# Patient Record
Sex: Female | Born: 1945
Health system: Southern US, Community
[De-identification: ages and names within clinical notes are randomized; demographics above are authoritative.]

## PROBLEM LIST (undated history)

## (undated) DIAGNOSIS — R7303 Prediabetes: Secondary | ICD-10-CM

## (undated) DIAGNOSIS — E1140 Type 2 diabetes mellitus with diabetic neuropathy, unspecified: Secondary | ICD-10-CM

## (undated) DIAGNOSIS — Z8709 Personal history of other diseases of the respiratory system: Secondary | ICD-10-CM

## (undated) DIAGNOSIS — H3530 Unspecified macular degeneration: Secondary | ICD-10-CM

## (undated) DIAGNOSIS — E119 Type 2 diabetes mellitus without complications: Secondary | ICD-10-CM

## (undated) DIAGNOSIS — R7309 Other abnormal glucose: Secondary | ICD-10-CM

## (undated) DIAGNOSIS — R2689 Other abnormalities of gait and mobility: Secondary | ICD-10-CM

## (undated) DIAGNOSIS — G43909 Migraine, unspecified, not intractable, without status migrainosus: Secondary | ICD-10-CM

## (undated) DIAGNOSIS — C439 Malignant melanoma of skin, unspecified: Secondary | ICD-10-CM

## (undated) DIAGNOSIS — G629 Polyneuropathy, unspecified: Secondary | ICD-10-CM

## (undated) DIAGNOSIS — Z87442 Personal history of urinary calculi: Secondary | ICD-10-CM

## (undated) DIAGNOSIS — M3500 Sicca syndrome, unspecified: Secondary | ICD-10-CM

## (undated) DIAGNOSIS — K589 Irritable bowel syndrome without diarrhea: Secondary | ICD-10-CM

## (undated) DIAGNOSIS — K760 Fatty (change of) liver, not elsewhere classified: Secondary | ICD-10-CM

## (undated) DIAGNOSIS — F419 Anxiety disorder, unspecified: Secondary | ICD-10-CM

## (undated) DIAGNOSIS — Z9889 Other specified postprocedural states: Secondary | ICD-10-CM

## (undated) DIAGNOSIS — R51 Headache: Secondary | ICD-10-CM

## (undated) DIAGNOSIS — M8580 Other specified disorders of bone density and structure, unspecified site: Secondary | ICD-10-CM

## (undated) DIAGNOSIS — M797 Fibromyalgia: Secondary | ICD-10-CM

## (undated) DIAGNOSIS — R112 Nausea with vomiting, unspecified: Secondary | ICD-10-CM

## (undated) DIAGNOSIS — Z8719 Personal history of other diseases of the digestive system: Secondary | ICD-10-CM

## (undated) DIAGNOSIS — Z8489 Family history of other specified conditions: Secondary | ICD-10-CM

## (undated) DIAGNOSIS — K219 Gastro-esophageal reflux disease without esophagitis: Secondary | ICD-10-CM

## (undated) DIAGNOSIS — H269 Unspecified cataract: Secondary | ICD-10-CM

## (undated) DIAGNOSIS — M1990 Unspecified osteoarthritis, unspecified site: Secondary | ICD-10-CM

## (undated) DIAGNOSIS — N200 Calculus of kidney: Secondary | ICD-10-CM

## (undated) DIAGNOSIS — F411 Generalized anxiety disorder: Secondary | ICD-10-CM

## (undated) DIAGNOSIS — I10 Essential (primary) hypertension: Secondary | ICD-10-CM

## (undated) HISTORY — DX: Other abnormal glucose: R73.09

## (undated) HISTORY — PX: SHOULDER SURGERY: SHX246

## (undated) HISTORY — DX: Prediabetes: R73.03

## (undated) HISTORY — DX: Unspecified macular degeneration: H35.30

## (undated) HISTORY — DX: Other specified disorders of bone density and structure, unspecified site: M85.80

## (undated) HISTORY — PX: UPPER GI ENDOSCOPY: SHX6162

## (undated) HISTORY — DX: Generalized anxiety disorder: F41.1

## (undated) HISTORY — PX: LITHOTRIPSY: SUR834

## (undated) HISTORY — DX: Type 2 diabetes mellitus with diabetic neuropathy, unspecified: E11.40

## (undated) HISTORY — PX: COLONOSCOPY: SHX174

## (undated) HISTORY — PX: TONSILLECTOMY: SUR1361

---
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
## 1974-12-29 HISTORY — PX: TUBAL LIGATION: SHX77

## 1976-12-29 HISTORY — PX: ABDOMINAL HYSTERECTOMY: SHX81

## 1999-11-05 ENCOUNTER — Encounter: Payer: Self-pay | Admitting: Obstetrics and Gynecology

## 1999-11-05 ENCOUNTER — Encounter: Admission: RE | Admit: 1999-11-05 | Discharge: 1999-11-05 | Payer: Self-pay | Admitting: Obstetrics and Gynecology

## 2000-11-23 ENCOUNTER — Encounter: Admission: RE | Admit: 2000-11-23 | Discharge: 2000-11-23 | Payer: Self-pay | Admitting: Obstetrics and Gynecology

## 2000-11-23 ENCOUNTER — Encounter: Payer: Self-pay | Admitting: Obstetrics and Gynecology

## 2000-12-03 ENCOUNTER — Other Ambulatory Visit: Admission: RE | Admit: 2000-12-03 | Discharge: 2000-12-03 | Payer: Self-pay | Admitting: Obstetrics and Gynecology

## 2000-12-23 ENCOUNTER — Encounter: Admission: RE | Admit: 2000-12-23 | Discharge: 2000-12-23 | Payer: Self-pay | Admitting: Obstetrics and Gynecology

## 2000-12-23 ENCOUNTER — Encounter: Payer: Self-pay | Admitting: Obstetrics and Gynecology

## 2001-11-24 ENCOUNTER — Encounter: Admission: RE | Admit: 2001-11-24 | Discharge: 2001-11-24 | Payer: Self-pay | Admitting: Obstetrics and Gynecology

## 2001-11-24 ENCOUNTER — Encounter: Payer: Self-pay | Admitting: Obstetrics and Gynecology

## 2001-12-08 ENCOUNTER — Other Ambulatory Visit: Admission: RE | Admit: 2001-12-08 | Discharge: 2001-12-08 | Payer: Self-pay | Admitting: Obstetrics and Gynecology

## 2002-01-01 ENCOUNTER — Encounter: Payer: Self-pay | Admitting: Family Medicine

## 2002-01-01 ENCOUNTER — Ambulatory Visit (HOSPITAL_COMMUNITY): Admission: RE | Admit: 2002-01-01 | Discharge: 2002-01-01 | Payer: Self-pay | Admitting: Family Medicine

## 2002-06-17 ENCOUNTER — Encounter: Payer: Self-pay | Admitting: Family Medicine

## 2002-06-17 ENCOUNTER — Ambulatory Visit (HOSPITAL_COMMUNITY): Admission: RE | Admit: 2002-06-17 | Discharge: 2002-06-17 | Payer: Self-pay | Admitting: Family Medicine

## 2002-06-23 ENCOUNTER — Ambulatory Visit (HOSPITAL_COMMUNITY): Admission: RE | Admit: 2002-06-23 | Discharge: 2002-06-23 | Payer: Self-pay | Admitting: Family Medicine

## 2002-06-23 ENCOUNTER — Encounter: Payer: Self-pay | Admitting: Family Medicine

## 2002-07-07 ENCOUNTER — Encounter: Payer: Self-pay | Admitting: General Surgery

## 2002-07-07 ENCOUNTER — Encounter: Admission: RE | Admit: 2002-07-07 | Discharge: 2002-07-07 | Payer: Self-pay | Admitting: General Surgery

## 2002-08-03 ENCOUNTER — Observation Stay (HOSPITAL_COMMUNITY): Admission: RE | Admit: 2002-08-03 | Discharge: 2002-08-04 | Payer: Self-pay | Admitting: General Surgery

## 2002-08-03 ENCOUNTER — Encounter: Payer: Self-pay | Admitting: General Surgery

## 2002-08-03 ENCOUNTER — Encounter (INDEPENDENT_AMBULATORY_CARE_PROVIDER_SITE_OTHER): Payer: Self-pay | Admitting: Specialist

## 2002-11-25 ENCOUNTER — Encounter: Payer: Self-pay | Admitting: Obstetrics and Gynecology

## 2002-11-25 ENCOUNTER — Encounter: Admission: RE | Admit: 2002-11-25 | Discharge: 2002-11-25 | Payer: Self-pay | Admitting: Obstetrics and Gynecology

## 2002-12-26 ENCOUNTER — Encounter: Admission: RE | Admit: 2002-12-26 | Discharge: 2002-12-26 | Payer: Self-pay | Admitting: Obstetrics and Gynecology

## 2002-12-26 ENCOUNTER — Encounter: Payer: Self-pay | Admitting: Obstetrics and Gynecology

## 2003-04-27 ENCOUNTER — Encounter: Payer: Self-pay | Admitting: Family Medicine

## 2003-04-27 ENCOUNTER — Ambulatory Visit (HOSPITAL_COMMUNITY): Admission: RE | Admit: 2003-04-27 | Discharge: 2003-04-27 | Payer: Self-pay | Admitting: Family Medicine

## 2003-05-08 ENCOUNTER — Encounter (INDEPENDENT_AMBULATORY_CARE_PROVIDER_SITE_OTHER): Payer: Self-pay | Admitting: Specialist

## 2003-05-08 ENCOUNTER — Ambulatory Visit (HOSPITAL_COMMUNITY): Admission: RE | Admit: 2003-05-08 | Discharge: 2003-05-08 | Payer: Self-pay | Admitting: Gastroenterology

## 2003-07-20 ENCOUNTER — Ambulatory Visit (HOSPITAL_COMMUNITY): Admission: RE | Admit: 2003-07-20 | Discharge: 2003-07-20 | Payer: Self-pay | Admitting: Family Medicine

## 2003-07-20 ENCOUNTER — Encounter: Payer: Self-pay | Admitting: Family Medicine

## 2003-08-01 ENCOUNTER — Ambulatory Visit (HOSPITAL_COMMUNITY): Admission: RE | Admit: 2003-08-01 | Discharge: 2003-08-01 | Payer: Self-pay | Admitting: Family Medicine

## 2003-08-28 ENCOUNTER — Ambulatory Visit (HOSPITAL_COMMUNITY): Admission: RE | Admit: 2003-08-28 | Discharge: 2003-08-28 | Payer: Self-pay | Admitting: Critical Care Medicine

## 2003-10-19 ENCOUNTER — Encounter: Payer: Self-pay | Admitting: Family Medicine

## 2003-10-19 ENCOUNTER — Ambulatory Visit (HOSPITAL_COMMUNITY): Admission: RE | Admit: 2003-10-19 | Discharge: 2003-10-19 | Payer: Self-pay | Admitting: Family Medicine

## 2003-12-07 ENCOUNTER — Encounter: Admission: RE | Admit: 2003-12-07 | Discharge: 2003-12-07 | Payer: Self-pay | Admitting: Obstetrics and Gynecology

## 2003-12-30 HISTORY — PX: CHOLECYSTECTOMY: SHX55

## 2004-04-19 ENCOUNTER — Ambulatory Visit (HOSPITAL_COMMUNITY): Admission: RE | Admit: 2004-04-19 | Discharge: 2004-04-19 | Payer: Self-pay | Admitting: Family Medicine

## 2004-05-23 ENCOUNTER — Ambulatory Visit (HOSPITAL_COMMUNITY): Admission: RE | Admit: 2004-05-23 | Discharge: 2004-05-23 | Payer: Self-pay | Admitting: Family Medicine

## 2004-08-20 ENCOUNTER — Ambulatory Visit (HOSPITAL_COMMUNITY): Admission: RE | Admit: 2004-08-20 | Discharge: 2004-08-20 | Payer: Self-pay | Admitting: Gastroenterology

## 2005-01-09 ENCOUNTER — Encounter: Admission: RE | Admit: 2005-01-09 | Discharge: 2005-01-09 | Payer: Self-pay | Admitting: Obstetrics and Gynecology

## 2005-07-30 ENCOUNTER — Encounter: Admission: RE | Admit: 2005-07-30 | Discharge: 2005-07-30 | Payer: Self-pay | Admitting: Obstetrics and Gynecology

## 2006-01-13 ENCOUNTER — Encounter: Admission: RE | Admit: 2006-01-13 | Discharge: 2006-01-13 | Payer: Self-pay | Admitting: Obstetrics and Gynecology

## 2007-02-01 ENCOUNTER — Encounter: Admission: RE | Admit: 2007-02-01 | Discharge: 2007-02-01 | Payer: Self-pay | Admitting: Family Medicine

## 2007-08-04 ENCOUNTER — Ambulatory Visit (HOSPITAL_COMMUNITY): Admission: RE | Admit: 2007-08-04 | Discharge: 2007-08-04 | Payer: Self-pay | Admitting: Family Medicine

## 2007-09-07 ENCOUNTER — Encounter (HOSPITAL_COMMUNITY): Admission: RE | Admit: 2007-09-07 | Discharge: 2007-09-28 | Payer: Self-pay | Admitting: Orthopedic Surgery

## 2007-09-30 ENCOUNTER — Encounter (HOSPITAL_COMMUNITY): Admission: RE | Admit: 2007-09-30 | Discharge: 2007-10-30 | Payer: Self-pay | Admitting: Orthopedic Surgery

## 2007-11-03 ENCOUNTER — Encounter: Admission: RE | Admit: 2007-11-03 | Discharge: 2007-11-03 | Payer: Self-pay | Admitting: Orthopedic Surgery

## 2008-02-23 ENCOUNTER — Encounter: Admission: RE | Admit: 2008-02-23 | Discharge: 2008-02-23 | Payer: Self-pay | Admitting: Obstetrics and Gynecology

## 2008-02-29 ENCOUNTER — Encounter: Admission: RE | Admit: 2008-02-29 | Discharge: 2008-02-29 | Payer: Self-pay | Admitting: Family Medicine

## 2008-03-06 ENCOUNTER — Encounter: Admission: RE | Admit: 2008-03-06 | Discharge: 2008-03-06 | Payer: Self-pay | Admitting: Obstetrics and Gynecology

## 2008-03-06 HISTORY — PX: BREAST BIOPSY: SHX20

## 2008-03-26 ENCOUNTER — Encounter: Admission: RE | Admit: 2008-03-26 | Discharge: 2008-03-26 | Payer: Self-pay | Admitting: Unknown Physician Specialty

## 2008-09-12 ENCOUNTER — Encounter: Admission: RE | Admit: 2008-09-12 | Discharge: 2008-09-12 | Payer: Self-pay | Admitting: Obstetrics and Gynecology

## 2008-10-15 ENCOUNTER — Encounter: Admission: RE | Admit: 2008-10-15 | Discharge: 2008-10-15 | Payer: Self-pay | Admitting: Unknown Physician Specialty

## 2008-11-15 ENCOUNTER — Ambulatory Visit (HOSPITAL_COMMUNITY): Admission: RE | Admit: 2008-11-15 | Discharge: 2008-11-15 | Payer: Self-pay | Admitting: Family Medicine

## 2008-12-29 HISTORY — PX: BILATERAL SALPINGOOPHORECTOMY: SHX1223

## 2009-01-01 DIAGNOSIS — J383 Other diseases of vocal cords: Secondary | ICD-10-CM | POA: Insufficient documentation

## 2009-01-01 DIAGNOSIS — R05 Cough: Secondary | ICD-10-CM

## 2009-01-01 DIAGNOSIS — R059 Cough, unspecified: Secondary | ICD-10-CM | POA: Insufficient documentation

## 2009-01-01 DIAGNOSIS — K219 Gastro-esophageal reflux disease without esophagitis: Secondary | ICD-10-CM | POA: Insufficient documentation

## 2009-01-02 ENCOUNTER — Ambulatory Visit: Payer: Self-pay | Admitting: Critical Care Medicine

## 2009-03-06 ENCOUNTER — Ambulatory Visit: Payer: Self-pay | Admitting: Critical Care Medicine

## 2009-03-12 ENCOUNTER — Encounter: Payer: Self-pay | Admitting: Critical Care Medicine

## 2009-03-12 ENCOUNTER — Encounter: Admission: RE | Admit: 2009-03-12 | Discharge: 2009-03-12 | Payer: Self-pay | Admitting: Obstetrics and Gynecology

## 2009-11-27 ENCOUNTER — Ambulatory Visit (HOSPITAL_COMMUNITY): Admission: RE | Admit: 2009-11-27 | Discharge: 2009-11-27 | Payer: Self-pay | Admitting: Obstetrics and Gynecology

## 2010-03-18 ENCOUNTER — Encounter: Admission: RE | Admit: 2010-03-18 | Discharge: 2010-03-18 | Payer: Self-pay | Admitting: Obstetrics and Gynecology

## 2010-10-04 ENCOUNTER — Encounter: Admission: RE | Admit: 2010-10-04 | Discharge: 2010-10-04 | Payer: Self-pay | Admitting: Orthopedic Surgery

## 2010-10-14 ENCOUNTER — Ambulatory Visit (HOSPITAL_COMMUNITY): Admission: RE | Admit: 2010-10-14 | Discharge: 2010-10-14 | Payer: Self-pay | Admitting: Family Medicine

## 2010-10-15 ENCOUNTER — Ambulatory Visit (HOSPITAL_COMMUNITY): Admission: RE | Admit: 2010-10-15 | Discharge: 2010-10-15 | Payer: Self-pay | Admitting: Family Medicine

## 2010-10-15 ENCOUNTER — Encounter (INDEPENDENT_AMBULATORY_CARE_PROVIDER_SITE_OTHER): Payer: Self-pay | Admitting: Family Medicine

## 2011-01-18 ENCOUNTER — Encounter: Payer: Self-pay | Admitting: Family Medicine

## 2011-01-19 ENCOUNTER — Encounter: Payer: Self-pay | Admitting: Family Medicine

## 2011-02-18 ENCOUNTER — Other Ambulatory Visit: Payer: Self-pay | Admitting: Family Medicine

## 2011-02-18 DIAGNOSIS — Z1231 Encounter for screening mammogram for malignant neoplasm of breast: Secondary | ICD-10-CM

## 2011-03-24 ENCOUNTER — Ambulatory Visit
Admission: RE | Admit: 2011-03-24 | Discharge: 2011-03-24 | Disposition: A | Discharge status: Home / Self Care | Payer: BC Managed Care – PPO | Source: Ambulatory Visit | Attending: Family Medicine | Admitting: Family Medicine

## 2011-03-24 DIAGNOSIS — Z1231 Encounter for screening mammogram for malignant neoplasm of breast: Secondary | ICD-10-CM

## 2011-03-25 ENCOUNTER — Other Ambulatory Visit: Payer: Self-pay | Admitting: Family Medicine

## 2011-03-25 DIAGNOSIS — R928 Other abnormal and inconclusive findings on diagnostic imaging of breast: Secondary | ICD-10-CM

## 2011-04-01 ENCOUNTER — Ambulatory Visit
Admission: RE | Admit: 2011-04-01 | Discharge: 2011-04-01 | Disposition: A | Discharge status: Home / Self Care | Payer: BC Managed Care – PPO | Source: Ambulatory Visit | Attending: Family Medicine | Admitting: Family Medicine

## 2011-04-01 DIAGNOSIS — R928 Other abnormal and inconclusive findings on diagnostic imaging of breast: Secondary | ICD-10-CM

## 2011-04-02 LAB — CBC
MCV: 90.7 fL (ref 78.0–100.0)
Platelets: 294 10*3/uL (ref 150–400)
RBC: 4.55 MIL/uL (ref 3.87–5.11)

## 2011-04-02 LAB — TYPE AND SCREEN: Antibody Screen: NEGATIVE

## 2011-04-02 LAB — BASIC METABOLIC PANEL
CO2: 29 mEq/L (ref 19–32)
Calcium: 9.8 mg/dL (ref 8.4–10.5)
GFR calc Af Amer: >60 mL/min (ref >60)
GFR calc non Af Amer: >60 mL/min (ref >60)
Glucose, Bld: 124 mg/dL — ABNORMAL HIGH (ref 70–99)
Potassium: 3.6 mEq/L (ref 3.5–5.1)

## 2011-05-16 NOTE — Op Note (Signed)
NAME:  Victoria Andrade, Victoria Andrade                           ACCOUNT NO.:  [id number]   MEDICAL RECORD NO.:  [id number]                   PATIENT TYPE:  AMB   LOCATION:  ENDO                                 FACILITY:  [facility]   PHYSICIAN:  James L. Malon Kindle., M.D.          DATE OF BIRTH:  [birth year]   DATE OF PROCEDURE:  08/20/2004  DATE OF DISCHARGE:                                 OPERATIVE REPORT   PROCEDURE:  Esophagogastroduodenoscopy.   MEDICATIONS GIVEN:  Fentanyl 75 mcg, Versed 7 mg IV, Cetacaine spray.   The patient had a latex allergy and latex free equipment was used.   INDICATIONS FOR PROCEDURE:  Persistent bloating, epigastric and chest pain,  possibly due to reflux.   DESCRIPTION OF PROCEDURE:  The procedure was explained to the patient and  consent obtained.  With the patient in the left lateral decubitus position,  the Olympus scope was inserted and advanced.  The stomach was entered,  pylorus identified and passed.  The duodenum including the bulb and second  duodenum were seen well and were unremarkable.  The scope was withdrawn back  into the stomach.  The pyloric channel, antrum, and body were normal.  The  fundus and cardia were seen well on the retroflex view and were normal.  There was a hiatal hernia with a widely patent GE junction and a reddened  distal esophagus consistent with esophageal reflux.  There were no  ulcerations and no Barrett's esophagus.  The proximal esophagus was seen  well and was normal.  The scope was withdrawn.  The patient tolerated the  procedure well and was maintained on low flow oxygen and pulse oximetry  throughout the procedure.   ASSESSMENT:  Gastroesophageal reflux disease, 530.81.   PLAN:  Will give a trial of Pepcid and a sheet or anti-reflux instructions  and see back in the office in two months.                                               James L. Malon Kindle., M.D.    Waldron Session  D:  08/20/2004  T:  08/20/2004  Job:  [id number]   cc:   Corrie Mckusick, M.D.  [address] [place]. [place]  [place]  [place] [address]  Fax: [phone]

## 2011-05-16 NOTE — Procedures (Signed)
   NAME:  Victoria Andrade, Victoria Andrade                           ACCOUNT NO.:  [id number]   MEDICAL RECORD NO.:  [id number]                   PATIENT TYPE:  OUT   LOCATION:  RESP                                 FACILITY:  [facility]   PHYSICIAN:  Edward L. Juanetta Gosling, M.D.             DATE OF BIRTH:  12-22-1946   DATE OF PROCEDURE:  DATE OF DISCHARGE:                              PULMONARY FUNCTION TEST   IMPRESSION:  1. Spirometry does not reveal a ventilatory defect, but the flow volume loop     is somewhat rounded off which could indicated tracheal stenosis.  2. Lung volumes are normal.  3. DLCO is normal.  4. Arterial blood gases are normal.                                               Edward L. Juanetta Gosling, M.D.    [facility]  D:  08/01/2003  T:  08/01/2003  Job:  [id number]

## 2011-06-02 ENCOUNTER — Other Ambulatory Visit: Payer: Self-pay | Admitting: Dermatology

## 2012-03-12 ENCOUNTER — Other Ambulatory Visit: Payer: Self-pay | Admitting: Family Medicine

## 2012-03-12 DIAGNOSIS — Z1231 Encounter for screening mammogram for malignant neoplasm of breast: Secondary | ICD-10-CM

## 2012-04-01 ENCOUNTER — Ambulatory Visit
Admission: RE | Admit: 2012-04-01 | Discharge: 2012-04-01 | Disposition: A | Discharge status: Home / Self Care | Payer: BC Managed Care – PPO | Source: Ambulatory Visit | Attending: Family Medicine | Admitting: Family Medicine

## 2012-04-01 DIAGNOSIS — Z1231 Encounter for screening mammogram for malignant neoplasm of breast: Secondary | ICD-10-CM

## 2012-04-13 ENCOUNTER — Ambulatory Visit (HOSPITAL_COMMUNITY)
Admission: RE | Admit: 2012-04-13 | Discharge: 2012-04-13 | Disposition: A | Discharge status: Home / Self Care | Payer: BC Managed Care – PPO | Source: Ambulatory Visit | Attending: Family Medicine | Admitting: Family Medicine

## 2012-04-13 ENCOUNTER — Other Ambulatory Visit (HOSPITAL_COMMUNITY): Payer: Self-pay | Admitting: Family Medicine

## 2012-04-13 DIAGNOSIS — N200 Calculus of kidney: Secondary | ICD-10-CM

## 2012-04-13 DIAGNOSIS — K7689 Other specified diseases of liver: Secondary | ICD-10-CM | POA: Insufficient documentation

## 2012-04-13 DIAGNOSIS — R1032 Left lower quadrant pain: Secondary | ICD-10-CM | POA: Insufficient documentation

## 2012-04-13 DIAGNOSIS — R1011 Right upper quadrant pain: Secondary | ICD-10-CM | POA: Insufficient documentation

## 2012-04-13 DIAGNOSIS — R1012 Left upper quadrant pain: Secondary | ICD-10-CM | POA: Insufficient documentation

## 2012-04-13 DIAGNOSIS — N201 Calculus of ureter: Secondary | ICD-10-CM | POA: Insufficient documentation

## 2012-04-13 DIAGNOSIS — N1330 Unspecified hydronephrosis: Secondary | ICD-10-CM | POA: Insufficient documentation

## 2012-04-13 MED ORDER — IOHEXOL 300 MG/ML  SOLN
100.0000 mL | Freq: Once | INTRAMUSCULAR | Status: AC | PRN
  Administered 2012-04-13: 100 mL via INTRAVENOUS

## 2012-04-14 ENCOUNTER — Other Ambulatory Visit: Payer: Self-pay | Admitting: Urology

## 2012-04-15 ENCOUNTER — Encounter (HOSPITAL_COMMUNITY): Payer: Self-pay | Admitting: Pharmacy Technician

## 2012-04-15 ENCOUNTER — Encounter (HOSPITAL_COMMUNITY): Payer: Self-pay

## 2012-04-15 NOTE — Progress Notes (Signed)
Patient is allergic to most antibiotics.  CAN TOLERATE Z-PAC only No aspirin, advil , or toradol until after litho No fish oil or MVI until after litho Take laxative as instructed in blue folder  Instructed to take laxatives/enemas as per blue folder

## 2012-04-19 ENCOUNTER — Encounter (HOSPITAL_COMMUNITY): Payer: Self-pay | Admitting: *Deleted

## 2012-04-19 ENCOUNTER — Ambulatory Visit (HOSPITAL_COMMUNITY): Payer: BC Managed Care – PPO

## 2012-04-19 ENCOUNTER — Encounter (HOSPITAL_COMMUNITY)
Admission: RE | Disposition: A | Discharge status: Home / Self Care | Payer: Self-pay | Source: Ambulatory Visit | Attending: Urology

## 2012-04-19 ENCOUNTER — Ambulatory Visit (HOSPITAL_COMMUNITY)
Admission: RE | Admit: 2012-04-19 | Discharge: 2012-04-19 | Disposition: A | Discharge status: Home / Self Care | Payer: BC Managed Care – PPO | Source: Ambulatory Visit | Attending: Urology | Admitting: Urology

## 2012-04-19 DIAGNOSIS — I10 Essential (primary) hypertension: Secondary | ICD-10-CM | POA: Insufficient documentation

## 2012-04-19 DIAGNOSIS — N201 Calculus of ureter: Secondary | ICD-10-CM | POA: Insufficient documentation

## 2012-04-19 HISTORY — DX: Unspecified cataract: H26.9

## 2012-04-19 HISTORY — DX: Essential (primary) hypertension: I10

## 2012-04-19 HISTORY — DX: Other specified postprocedural states: R11.2

## 2012-04-19 HISTORY — DX: Irritable bowel syndrome, unspecified: K58.9

## 2012-04-19 HISTORY — DX: Other specified postprocedural states: Z98.890

## 2012-04-19 HISTORY — DX: Malignant melanoma of skin, unspecified: C43.9

## 2012-04-19 HISTORY — DX: Gastro-esophageal reflux disease without esophagitis: K21.9

## 2012-04-19 HISTORY — DX: Calculus of kidney: N20.0

## 2012-04-19 HISTORY — DX: Headache: R51

## 2012-04-19 SURGERY — LITHOTRIPSY, ESWL
Anesthesia: LOCAL | Laterality: Left

## 2012-04-19 MED ORDER — DEXTROSE-NACL 5-0.45 % IV SOLN
INTRAVENOUS | Status: DC
  Administered 2012-04-19: 16:00:00 via INTRAVENOUS

## 2012-04-19 MED ORDER — DIAZEPAM 5 MG PO TABS
10.0000 mg | ORAL_TABLET | ORAL | Status: AC
  Administered 2012-04-19: 10 mg via ORAL

## 2012-04-19 MED ORDER — DIPHENHYDRAMINE HCL 25 MG PO CAPS
25.0000 mg | ORAL_CAPSULE | ORAL | Status: AC
  Administered 2012-04-19: 25 mg via ORAL

## 2012-04-19 MED ORDER — DIPHENHYDRAMINE HCL 25 MG PO CAPS
ORAL_CAPSULE | ORAL | Status: AC
  Administered 2012-04-19: 25 mg via ORAL
  Filled 2012-04-19: qty 1

## 2012-04-19 MED ORDER — DIAZEPAM 5 MG PO TABS
ORAL_TABLET | ORAL | Status: AC
  Administered 2012-04-19: 10 mg via ORAL
  Filled 2012-04-19: qty 2

## 2012-04-19 NOTE — Progress Notes (Signed)
Pt instructed to hold her xanax and benadryl tonight as she has had valium and benadryl as oral premedication to lithotripsy. She and husband verbalize understanding.

## 2012-04-19 NOTE — Progress Notes (Signed)
Patient voided post litho. Verbalizes understanding of d/c instructions. Has collection cup and strainer. Home with husband at 65.

## 2012-04-19 NOTE — H&P (Signed)
  See written H & P on paper chart.

## 2012-04-19 NOTE — Discharge Instructions (Signed)
1. You may see some blood in the urine and may have some burning with urination for 48-72 hours. You also may notice that you have to urinate more frequently or urgently after your procedure which is normal.  2. You should call should you develop an inability urinate, fever > 101, persistent nausea and vomiting that prevents you from eating or drinking to stay hydrated.  3. Take your pain medication that you have as needed.       4.    You will be given a urine strainer.  Please strain your urine and bring any stone fragments with you to your followup appointment with Dr. Laverle Patter.

## 2012-06-01 ENCOUNTER — Other Ambulatory Visit: Payer: Self-pay | Admitting: Urology

## 2012-06-02 ENCOUNTER — Other Ambulatory Visit: Payer: Self-pay | Admitting: Dermatology

## 2012-06-08 ENCOUNTER — Encounter (HOSPITAL_COMMUNITY): Payer: Self-pay | Admitting: *Deleted

## 2012-06-08 NOTE — Progress Notes (Signed)
Pt instructed to arrive 1415 to Short Stay on 06/14/12 with blue folder, responsible driver, and insurance card. Patient is allergic to Cipro and basket message sent to Dr Laverle Patter. She did not receive any antibiotic on 04/19/12 treatment. Reviewed need for laxative on 06/13/12 between 3-6 PM. No aspirin or NSAIDs. Pt verbalizes understanding.

## 2012-06-09 ENCOUNTER — Encounter (HOSPITAL_COMMUNITY): Payer: Self-pay | Admitting: Pharmacy Technician

## 2012-06-13 NOTE — H&P (Signed)
History of Present Illness  Victoria Andrade is a 66 year old previously followed by Dr. Gaynelle Arabian with the following urologic history:  1) Urolithiasis: She has a history of urolithiasis.  Prior treatment: 1995: ESWL Apr 2013: ESWL 8 mm left ureteral stone  Interval history:  She follows up today for further evaluation of her left ureteral calculi as well as her left renal calculi. She did pass 2 small fragments prior to her appointment today and currently remains symptom-free.   Past Medical History Problems  1. History of  Anxiety (Symptom) 300.00 2. History of  Esophageal Reflux 530.81 3. History of  Hypertension 401.9 4. History of  Irritable Bowel Syndrome 564.1  Surgical History Problems  1. History of  Gallbladder Surgery 2. History of  Hysterectomy V45.77 3. History of  Lithotripsy 4. History of  Tonsillectomy 5. History of  Tubal Ligation V25.2  Current Meds 1. Amerge 2.5 MG Oral Tablet; Therapy: (Recorded:17Apr2013) to 2. Benadryl TABS; Therapy: (Recorded:17Apr2013) to 3. Bystolic 2.5 MG Oral Tablet; Therapy: 25Oct2012 to 4. Furosemide 40 MG Oral Tablet; Therapy: 24Oct2012 to 5. Hydrochlorothiazide 25 MG Oral Tablet; Therapy: 24Oct2012 to 6. Premarin 0.625 MG Oral Tablet; Therapy: (Recorded:08Feb2008) to 7. Xanax 1 MG Oral Tablet; Therapy: (Recorded:17Apr2013) to  Allergies Medication  1. Keflex TABS 2. Ciprofloxacin HCl TABS 3. Penicillins 4. Sulfa Drugs Non-Medication  5. Adhesive Tape 6. Latex  Family History Problems  1. Maternal history of  Acute Myocardial Infarction V17.3 2. Family history of  Acute Myocardial Infarction V17.3 grandparents 3. Maternal history of  Chronic Obstructive Pulmonary Disease 4. Family history of  Diabetes Mellitus V18.0 5. Maternal history of  Ischemic Stroke V17.1  Social History Problems  1. Family history of  Death In The Family Mother age 36 emphysema COPD 2. Marital History - Currently Married 3. Never A  Smoker 4. 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 Alcohol Use 6. Tobacco Use  Vitals Vital Signs [Data Includes: Last 1 Day]  03Jun2013 10:17AM  Blood Pressure: 136 / 75 Heart Rate: 68  Physical Exam Constitutional: Well nourished and well developed . No acute distress.  Pulmonary: No respiratory distress and normal respiratory rhythm and effort.  Cardiovascular: Heart rate and rhythm are normal . No peripheral edema.    Results/Data Urine [Data Includes: Last 1 Day]   03Jun2013 COLOR YELLOW  APPEARANCE CLEAR  SPECIFIC GRAVITY 1.020  pH 6.0  GLUCOSE NEG mg/dL BILIRUBIN NEG  KETONE NEG mg/dL BLOOD NEG  PROTEIN NEG mg/dL UROBILINOGEN 0.2 mg/dL NITRITE NEG  LEUKOCYTE ESTERASE SMALL  SQUAMOUS EPITHELIAL/HPF RARE  WBC 7-10 WBC/hpf RBC 0-3 RBC/hpf BACTERIA NONE SEEN  CRYSTALS NONE SEEN  CASTS NONE SEEN    I reviewed her KUB x-ray today. This demonstrates a stable 1.7 cm upper pole calculus which is unchanged. The previously seen calcification in the vicinity of the distal ureter has now disappeared.   Assessment Assessed  1. Ureteral Stone 592.1 2. Nephrolithiasis 592.0  Plan Health Maintenance (V70.0)  1. UA With REFLEX  Done: 03Jun2013 10:06AM Nephrolithiasis (592.0)  2. BASIC METABOLIC PANEL  Done: 03Jun2013 3. MAGNESIUM  Done: 03Jun2013 4. URIC ACID  Done: 03Jun2013 5. VENIPUNCTURE  Done: 03Jun2013 6. Follow-up Schedule Surgery Office  Follow-up  Done: 03Jun2013  Discussion/Summary  1. Left ureteral calculus: She appears to have passed a remaining fragments.  2. Left renal calculi: We discussed the fact that she has a remaining large left renal calculus and the options for further management. She does wish to  consider shockwave lithotripsy as an option although understands ureteroscopic laser lithotripsy and left percutaneous nephrolithotomy also remain options. We have reviewed the pros and cons of different approaches for treatment as well as the option  of continued surveillance. She is interested in proactive treatment of her stone and would like to consider shockwave lithotripsy. We also talked about the pros and cons of ureteral stent placement prior to lithotripsy. She would like to avoid a ureteral stent if possible. We have reviewed the potential risks of shockwave lithotripsy including but not limited to bleeding, infection, damage/loss of kidney, and the potential need for further treatment, et Karie Soda. She would like to proceed with treatment this summer.   3. Recurrent urolithiasis: She will proceed with a metabolic evaluation including serum and 24-hour urine testing.  Cc: Dr. Assunta Found     Verified Results URIC ACID1 03Jun2013 10:53AM1 [name] SPECIMEN TYPE: BLOOD [Jun 01, 2012 3:00PM [name]] Notify patient that all labs are normal except uric acid which is mildly high. I would like to wait until I see her 24 hr urine results before making recommendations, but she might benefit from limiting her protein intake some in her diet.  Test Name Result Flag Reference URIC ACID1 7.5 mg/dL1 H1 2.4-7.01  BASIC METABOLIC PANEL1 03Jun2013 10:52AM1 [name] SPECIMEN TYPE: BLOOD [Jun 01, 2012 3:00PM [name]] Notify patient that all labs are normal except uric acid which is mildly high. I would like to wait until I see her 24 hr urine results before making recommendations, but she might benefit from limiting her protein intake some in her diet.  Test Name Result Flag Reference GLUCOSE1 97 mg/dL1  70-991 BUN1 20 mg/dL1  6-231 CREATININE1 0.82 mg/dL1  0.50-1.401 SODIUM1 141 mEq/L1  [phone] POTASSIUM1 3.9 mEq/L1  3.5-5.31 CHLORIDE1 104 mEq/L1  96-1121 CO21 26 mEq/L1  19-321 CALCIUM1 9.6 mg/dL1  8.4-10.51 Est GFR, African American1 87 mL/min1   Est GFR, NonAfrican American1 76 mL/min1   The estimated GFR is a calculation valid for adults (71 to 65 years old) that uses the CKD-EPI algorithm to adjust for age  and sex. It is not to be used for children, pregnant women, hospitalized patients, patients on dialysis, or with rapidly changing kidney function. According to the NKDEP, eGFR >89 is normal, 60-89 shows mild impairment, 30-59 shows moderate impairment, 15-29 shows severe impairment and <15 is ESRD.  MAGNESIUM1 03Jun2013 10:52AM1 [name] SPECIMEN TYPE: BLOOD [Jun 01, 2012 3:00PM Ryelynn Guedea] Notify patient that all labs are normal except uric acid which is mildly high. I would like to wait until I see her 24 hr urine results before making recommendations, but she might benefit from limiting her protein intake some in her diet.  Test Name Result Flag Reference MAGNESIUM1 1.7 mg/dL1  1.5-2.51    1. Amended By: Heloise Purpura; 06/01/2012 3:00 PMEST  Signatures Electronically signed by : Heloise Purpura, M.D.; Jun 01 2012  3:00PM

## 2012-06-14 ENCOUNTER — Encounter (HOSPITAL_COMMUNITY)
Admission: RE | Disposition: A | Discharge status: Home / Self Care | Payer: Self-pay | Source: Ambulatory Visit | Attending: Urology

## 2012-06-14 ENCOUNTER — Ambulatory Visit (HOSPITAL_COMMUNITY): Payer: BC Managed Care – PPO

## 2012-06-14 ENCOUNTER — Encounter (HOSPITAL_COMMUNITY): Payer: Self-pay | Admitting: *Deleted

## 2012-06-14 ENCOUNTER — Ambulatory Visit (HOSPITAL_COMMUNITY)
Admission: RE | Admit: 2012-06-14 | Discharge: 2012-06-14 | Disposition: A | Discharge status: Home / Self Care | Payer: BC Managed Care – PPO | Source: Ambulatory Visit | Attending: Urology | Admitting: Urology

## 2012-06-14 DIAGNOSIS — K219 Gastro-esophageal reflux disease without esophagitis: Secondary | ICD-10-CM | POA: Insufficient documentation

## 2012-06-14 DIAGNOSIS — K589 Irritable bowel syndrome without diarrhea: Secondary | ICD-10-CM | POA: Insufficient documentation

## 2012-06-14 DIAGNOSIS — I10 Essential (primary) hypertension: Secondary | ICD-10-CM | POA: Insufficient documentation

## 2012-06-14 DIAGNOSIS — Z79899 Other long term (current) drug therapy: Secondary | ICD-10-CM | POA: Insufficient documentation

## 2012-06-14 DIAGNOSIS — N200 Calculus of kidney: Secondary | ICD-10-CM | POA: Insufficient documentation

## 2012-06-14 HISTORY — DX: Anxiety disorder, unspecified: F41.9

## 2012-06-14 SURGERY — LITHOTRIPSY, ESWL
Anesthesia: LOCAL | Laterality: Left

## 2012-06-14 MED ORDER — DIAZEPAM 5 MG PO TABS
10.0000 mg | ORAL_TABLET | ORAL | Status: AC
  Administered 2012-06-14: 10 mg via ORAL

## 2012-06-14 MED ORDER — DIAZEPAM 5 MG PO TABS
ORAL_TABLET | ORAL | Status: AC
  Administered 2012-06-14: 10 mg via ORAL
  Filled 2012-06-14: qty 2

## 2012-06-14 MED ORDER — DEXTROSE-NACL 5-0.45 % IV SOLN
INTRAVENOUS | Status: DC
  Administered 2012-06-14: 15:00:00 via INTRAVENOUS

## 2012-06-14 MED ORDER — DIPHENHYDRAMINE HCL 25 MG PO CAPS
ORAL_CAPSULE | ORAL | Status: AC
  Administered 2012-06-14: 25 mg via ORAL
  Filled 2012-06-14: qty 1

## 2012-06-14 MED ORDER — DIPHENHYDRAMINE HCL 25 MG PO CAPS
25.0000 mg | ORAL_CAPSULE | ORAL | Status: AC
  Administered 2012-06-14: 25 mg via ORAL

## 2012-06-14 NOTE — Interval H&P Note (Signed)
History and Physical Interval Note:  06/14/2012 6:53 PM  Victoria Andrade  has presented today for surgery, with the diagnosis of Left Renal Calculus  The various methods of treatment have been discussed with the patient and family. After consideration of risks, benefits and other options for treatment, the patient has consented to  Procedure(s) (LRB): EXTRACORPOREAL SHOCK WAVE LITHOTRIPSY (ESWL) (Left) as a surgical intervention .  The patient's history has been reviewed, patient examined, no change in status, stable for surgery.  I have reviewed the patients' chart and labs.  Questions were answered to the patient's satisfaction.     Gurvir Schrom,LES

## 2012-06-14 NOTE — Discharge Instructions (Addendum)
- Strain all urine and bring fragments to followup appointment with you. - Call if you develop fever > 101 degrees, uncontrolled pain, or persistent severe nausea and vomiting - Use pain medication as needed (this has been called into your pharmacy)Lithotripsy Care After Refer to this sheet for the next few weeks. These discharge instructions provide you with general information on caring for yourself after you leave the hospital. Your caregiver may also give you specific instructions. Your treatment has been planned according to the most current medical practices available, but unavoidable complications sometimes occur. If you have any problems or questions after discharge, please call your caregiver. AFTER THE PROCEDURE   The recovery time will vary with the procedure done.   You will be taken to the recovery area. A nurse will watch and check your progress. Once you are awake, stable, and taking fluids well, you will be allowed to go home as long as there are no problems.   Your urine may have a red tinge for a few days after treatment. Blood loss is usually minimal.   You may have soreness in the back or flank area. This usually goes away after a few days. The procedure can cause blotches or bruises on the back where the pressure wave enters the skin. These marks usually cause only minimal discomfort and should disappear in a short time.   Stone fragments should begin to pass within 24 hours of treatment. However, a delayed passage is not unusual.   You may have pain, discomfort, and feel sick to your stomach (nauseous) when the crushed (pulverized) fragments of stone are passed down the tube from the kidney to the bladder. Stone fragments can pass soon after the procedure and may last for up to 4 to 8 weeks.   A small number of patients may have severe pain when stone fragments are not able to pass, which leads to an obstruction.   If your stone is greater than 1 inch/2.5 centimeters in  diameter or if you have multiple stones that have a combined diameter greater than 1 inch/2.5 centimeters, you may require more than 1 treatment.   You must have someone drive you home.  HOME CARE INSTRUCTIONS   Rest at home until you feel your energy improving.   Only take over-the-counter or prescription medicines for pain, discomfort, or fever as directed by your caregiver. Depending on the type of lithotripsy, you may need to take medicines (antibiotics) that kill germs and anti-inflammatory medicines for a few days.   Drink enough water and fluids to keep your urine clear or pale yellow. This helps "flush" your kidneys. It helps pass any remaining pieces of stone and prevents stones from coming back.   Most people can resume daily activities within 1 or 2 days after standard lithotripsy. It can take longer to recover from laser and percutaneous lithotripsy.   If the stones are in your urinary system, you may be asked to strain your urine at home to look for stones. Any stones that are found can be sent to a medical lab for examination.   Visit your caregiver for a follow-up appointment in a few weeks. Your doctor may remove your stent if you have one. Your caregiver will also check to see whether stone particles still remain.  SEEK MEDICAL CARE IF:   You have an oral temperature above 102 F (38.9 C).   Your pain is not relieved by medicine.   You have a lasting nauseous feeling.  You feel there is too much blood in the urine.   You develop persistent problems with frequent and/or painful urination that does not at least partially improve after 2 days following the procedure.   You have a congested cough.   You feel lightheaded.   You develop a rash or any other signs that might suggest an allergic problem.   You develop any reaction or side effects to your medicine(s).  SEEK IMMEDIATE MEDICAL CARE IF:   You experience severe back and/or flank pain.   You see nothing but  blood when you urinate.   You cannot pass any urine at all.   You have an oral temperature above 102 F (38.9 C), not controlled by medicine.   You develop shortness of breath, difficulty breathing, or chest pain.   You develop vomiting that will not stop after 6 to 8 hours.   You have a fainting episode.  MAKE SURE YOU:   Understand these instructions.   Will watch your condition.   Will get help right away if you are not doing well or get worse.  Document Released: 01/04/2008 Document Revised: 12/04/2011 Document Reviewed: 01/04/2008 [facility] Patient Information 2012 [place], [place].

## 2012-06-14 NOTE — Op Note (Signed)
ESWL of left renal calculi.  See paper chart for op note.

## 2013-02-22 ENCOUNTER — Other Ambulatory Visit: Payer: Self-pay | Admitting: Family Medicine

## 2013-02-22 DIAGNOSIS — Z1231 Encounter for screening mammogram for malignant neoplasm of breast: Secondary | ICD-10-CM

## 2013-03-16 ENCOUNTER — Other Ambulatory Visit (HOSPITAL_COMMUNITY): Payer: Self-pay | Admitting: Family Medicine

## 2013-03-16 DIAGNOSIS — N200 Calculus of kidney: Secondary | ICD-10-CM

## 2013-03-17 ENCOUNTER — Ambulatory Visit (HOSPITAL_COMMUNITY)
Admission: RE | Admit: 2013-03-17 | Discharge: 2013-03-17 | Disposition: A | Discharge status: Home / Self Care | Payer: Medicare PPO | Source: Ambulatory Visit | Attending: Family Medicine | Admitting: Family Medicine

## 2013-03-17 DIAGNOSIS — N200 Calculus of kidney: Secondary | ICD-10-CM | POA: Insufficient documentation

## 2013-03-17 DIAGNOSIS — R1031 Right lower quadrant pain: Secondary | ICD-10-CM | POA: Insufficient documentation

## 2013-03-29 ENCOUNTER — Other Ambulatory Visit: Payer: Self-pay | Admitting: Gastroenterology

## 2013-03-29 DIAGNOSIS — R1310 Dysphagia, unspecified: Secondary | ICD-10-CM

## 2013-03-31 ENCOUNTER — Ambulatory Visit
Admission: RE | Admit: 2013-03-31 | Discharge: 2013-03-31 | Disposition: A | Discharge status: Home / Self Care | Payer: Medicare PPO | Source: Ambulatory Visit | Attending: Gastroenterology | Admitting: Gastroenterology

## 2013-03-31 DIAGNOSIS — R1310 Dysphagia, unspecified: Secondary | ICD-10-CM

## 2013-04-04 ENCOUNTER — Ambulatory Visit
Admission: RE | Admit: 2013-04-04 | Discharge: 2013-04-04 | Disposition: A | Discharge status: Home / Self Care | Payer: Medicare PPO | Source: Ambulatory Visit | Attending: Family Medicine | Admitting: Family Medicine

## 2013-04-04 DIAGNOSIS — Z1231 Encounter for screening mammogram for malignant neoplasm of breast: Secondary | ICD-10-CM

## 2014-02-27 ENCOUNTER — Other Ambulatory Visit: Payer: Self-pay

## 2014-02-27 DIAGNOSIS — Z1231 Encounter for screening mammogram for malignant neoplasm of breast: Secondary | ICD-10-CM

## 2014-03-15 ENCOUNTER — Other Ambulatory Visit (HOSPITAL_COMMUNITY): Payer: Self-pay | Admitting: Family Medicine

## 2014-03-15 ENCOUNTER — Ambulatory Visit (HOSPITAL_COMMUNITY)
Admission: RE | Admit: 2014-03-15 | Discharge: 2014-03-15 | Disposition: A | Discharge status: Home / Self Care | Payer: Medicare PPO | Source: Ambulatory Visit | Attending: Family Medicine | Admitting: Family Medicine

## 2014-03-15 DIAGNOSIS — N200 Calculus of kidney: Secondary | ICD-10-CM

## 2014-03-15 DIAGNOSIS — N1330 Unspecified hydronephrosis: Secondary | ICD-10-CM

## 2014-03-15 DIAGNOSIS — K769 Liver disease, unspecified: Secondary | ICD-10-CM | POA: Insufficient documentation

## 2014-03-15 DIAGNOSIS — Z87442 Personal history of urinary calculi: Secondary | ICD-10-CM | POA: Insufficient documentation

## 2014-04-06 ENCOUNTER — Ambulatory Visit
Admission: RE | Admit: 2014-04-06 | Discharge: 2014-04-06 | Disposition: A | Discharge status: Home / Self Care | Payer: Medicare PPO | Source: Ambulatory Visit

## 2014-04-06 ENCOUNTER — Other Ambulatory Visit: Payer: Self-pay | Admitting: Family Medicine

## 2014-04-06 DIAGNOSIS — Z1231 Encounter for screening mammogram for malignant neoplasm of breast: Secondary | ICD-10-CM

## 2014-04-06 DIAGNOSIS — R928 Other abnormal and inconclusive findings on diagnostic imaging of breast: Secondary | ICD-10-CM

## 2014-04-17 ENCOUNTER — Ambulatory Visit
Admission: RE | Admit: 2014-04-17 | Discharge: 2014-04-17 | Disposition: A | Discharge status: Home / Self Care | Payer: Medicare PPO | Source: Ambulatory Visit | Attending: Family Medicine | Admitting: Family Medicine

## 2014-04-17 DIAGNOSIS — R928 Other abnormal and inconclusive findings on diagnostic imaging of breast: Secondary | ICD-10-CM

## 2014-10-02 ENCOUNTER — Other Ambulatory Visit (HOSPITAL_COMMUNITY): Payer: Self-pay | Admitting: Family Medicine

## 2014-10-02 DIAGNOSIS — R0989 Other specified symptoms and signs involving the circulatory and respiratory systems: Secondary | ICD-10-CM

## 2014-10-05 ENCOUNTER — Ambulatory Visit (HOSPITAL_COMMUNITY)
Admission: RE | Admit: 2014-10-05 | Discharge: 2014-10-05 | Disposition: A | Discharge status: Home / Self Care | Payer: Medicare PPO | Source: Ambulatory Visit | Attending: Family Medicine | Admitting: Family Medicine

## 2014-10-05 DIAGNOSIS — H539 Unspecified visual disturbance: Secondary | ICD-10-CM | POA: Insufficient documentation

## 2014-10-05 DIAGNOSIS — R55 Syncope and collapse: Secondary | ICD-10-CM | POA: Insufficient documentation

## 2014-10-05 DIAGNOSIS — R0989 Other specified symptoms and signs involving the circulatory and respiratory systems: Secondary | ICD-10-CM | POA: Diagnosis not present

## 2014-10-05 DIAGNOSIS — H9319 Tinnitus, unspecified ear: Secondary | ICD-10-CM | POA: Insufficient documentation

## 2014-10-13 ENCOUNTER — Other Ambulatory Visit: Payer: Self-pay

## 2014-12-29 DIAGNOSIS — M8580 Other specified disorders of bone density and structure, unspecified site: Secondary | ICD-10-CM

## 2014-12-29 HISTORY — DX: Other specified disorders of bone density and structure, unspecified site: M85.80

## 2015-01-30 ENCOUNTER — Telehealth: Payer: Self-pay | Admitting: *Deleted

## 2015-01-30 DIAGNOSIS — N9489 Other specified conditions associated with female genital organs and menstrual cycle: Secondary | ICD-10-CM

## 2015-01-30 NOTE — Telephone Encounter (Signed)
CT scan from [facility] reviewed by Dr Sabra Heck and Dr Quincy Simmonds, along with office visit notes. Call to patient to discuss referral from Dr. [name]. Offered appointment for this Thursday, 02/01/2015 with Dr. Sabra Heck. Patient has multiple friends that see Dr. Quincy Simmonds and patient request to be scheduled with her as well.previous patient of Dr Joan Flores prior to her retirement. Appointment scheduled with Dr Quincy Simmonds for 02-12-15 at [facility]. Directions given to office.  Advised that pelvic ultrasound is recommended. Will check with Dr Quincy Simmonds to see if this should be done prior to appointment or if can be scheduled in office following NGYN appointment and we will call her back tomorrow.   Dr Quincy Simmonds, would you prefer I have Dr [name] order PUS at hospital before she comes here or scheduled for 02-15-15 in office?

## 2015-01-30 NOTE — Telephone Encounter (Signed)
Please do prior authorization for ultrasound for 02/15/15 and I will do a new patient consultation on the same day.

## 2015-02-01 NOTE — Telephone Encounter (Signed)
Patient calling to check on status of ultrasound request.

## 2015-02-01 NOTE — Telephone Encounter (Signed)
Return call to patient. Advised of recommendation from Dr. Quincy Simmonds. Can combine new GYN and pelvic ultrasound appointment on 02/15/2015. Appointment scheduled. Encounter closed.

## 2015-02-12 ENCOUNTER — Ambulatory Visit: Payer: Self-pay | Admitting: Obstetrics and Gynecology

## 2015-02-15 ENCOUNTER — Other Ambulatory Visit: Payer: Self-pay

## 2015-02-15 ENCOUNTER — Ambulatory Visit (INDEPENDENT_AMBULATORY_CARE_PROVIDER_SITE_OTHER): Payer: Medicare PPO | Admitting: Obstetrics and Gynecology

## 2015-02-15 ENCOUNTER — Ambulatory Visit (INDEPENDENT_AMBULATORY_CARE_PROVIDER_SITE_OTHER): Payer: Medicare PPO

## 2015-02-15 ENCOUNTER — Encounter: Payer: Self-pay | Admitting: Obstetrics and Gynecology

## 2015-02-15 VITALS — BP 116/86 | HR 88 | Ht 61.75 in | Wt 186.8 lb

## 2015-02-15 DIAGNOSIS — IMO0002 Reserved for concepts with insufficient information to code with codable children: Secondary | ICD-10-CM

## 2015-02-15 DIAGNOSIS — N9489 Other specified conditions associated with female genital organs and menstrual cycle: Secondary | ICD-10-CM

## 2015-02-15 DIAGNOSIS — D487 Neoplasm of uncertain behavior of other specified sites: Secondary | ICD-10-CM

## 2015-02-15 DIAGNOSIS — N949 Unspecified condition associated with female genital organs and menstrual cycle: Secondary | ICD-10-CM

## 2015-02-15 NOTE — Progress Notes (Signed)
69 y.o. [id number] MarriedCaucasianF here for pelvic cyst noted on CT scan.  Status post robotic bilateral salpingo-oophorectomy for bilateral benign serous cystadenomas in 2010 - Dr. Joan Flores.   Had CT scan done for bilateral kidney stones and a pelvic cyst was noted at the time.  Dr. Dutch Gray referred for evaluation of this.   Has IBS and has random abdominal and back pain.   No LMP recorded. Patient has had a hysterectomy.          Sexually active: Yes.    The current method of family planning is tubal ligation and status post total abdominal hysterectomy age 6 years old. Dr. Ardine Eng.   Exercising: No.  The patient does not participate in regular exercise at present. Smoker:  no  Health Maintenance: Pap:  2010 WNL  History of abnormal Pap:  no MMG:  04/06/14 Breast Density Category C Bi-Rads 0; 04/17/14 Digital Diagnostic Left MMG/ Left Breast Ultrasound Bi-Rads 2: Benign Colonoscopy:  2014 Normal f/u in 2019 BMD:  10/16/2010 TDaP:  Up to date  Screening Labs: No, Labs done by Sharilyn Sites, MD    reports that she drinks alcohol. She reports that she does not use illicit drugs.  Past Medical History  Diagnosis Date  . Hypertension   . IBS (irritable bowel syndrome)   . GERD (gastroesophageal reflux disease)   . Kidney stones   . Headache(784.0)     migraines  . Cataract immature   . PONV (postoperative nausea and vomiting)   . Anxiety     takes xanax  . Melanoma     left eye    Past Surgical History  Procedure Laterality Date  . Abdominal hysterectomy  1978    partial  . Bilateral salpingoophorectomy  2010    with robot  . Cholecystectomy  2005    lap  . Tonsillectomy      as child  . Tubal ligation  1976  . Lithotripsy      20 years ago    Current Outpatient Prescriptions  Medication Sig Dispense Refill  . ALPRAZolam (XANAX) 1 MG tablet Take 1 mg by mouth at bedtime as needed. anxiety    . Cholecalciferol (VITAMIN D3) 5000 UNITS CAPS Take by mouth.     . diphenhydrAMINE (BENADRYL) 25 MG tablet Take 25 mg by mouth at bedtime as needed. sleep    . estrogens, conjugated, (PREMARIN) 0.625 MG tablet Take 0.625 mg by mouth 2 (two) times daily.     . famotidine (PEPCID) 20 MG tablet Take 20 mg by mouth as needed. For indigestion    . furosemide (LASIX) 40 MG tablet Take 40 mg by mouth as needed. For fluid    . hydrochlorothiazide (HYDRODIURIL) 12.5 MG tablet Take 12.5 mg by mouth daily. Takes around lunchtime    . hydrochlorothiazide (HYDRODIURIL) 25 MG tablet Take 25 mg by mouth daily.    Marland Kitchen loratadine (CLARITIN) 10 MG tablet Take 10 mg by mouth daily.    . montelukast (SINGULAIR) 10 MG tablet Take 10 mg by mouth at bedtime.    . naratriptan (AMERGE) 2.5 MG tablet Take 2.5 mg by mouth as needed. Take one (1) tablet at onset of headache; if returns or does not resolve, may repeat after 4 hours; do not exceed five (5) mg in 24 hours.    . Polyvinyl Alcohol-Povidone (REFRESH OP) Apply 1 drop to eye daily as needed. For dry eyes     No current facility-administered medications for this visit.  Family History  Problem Relation Age of Onset  . Adopted: Yes  . Diabetes Maternal Grandmother   . Hypertension Mother   . Hypertension Maternal Grandmother   . Hypertension Maternal Grandfather   . Stroke Maternal Grandfather   . Stroke Maternal Uncle   . Stroke Maternal Uncle   . Heart attack Maternal Grandmother   . Heart attack Maternal Uncle   . Heart attack Maternal Uncle   . Lung cancer Maternal Uncle   . Thyroid disease Mother     ROS:  Pertinent items are noted in HPI.  Otherwise, a comprehensive ROS was negative.  Exam:   BP 116/86 mmHg  Pulse 88  Ht 5' 1.75" (1.568 m)  Wt 186 lb 12.8 oz (84.732 kg)  BMI 34.46 kg/m2     Height: 5' 1.75" (156.8 cm)  Ht Readings from Last 3 Encounters:  02/15/15 5' 1.75" (1.568 m)  06/14/12 5\' 2"  (1.575 m)  03/06/09 5\' 3"  (1.6 m)    General appearance: alert, cooperative and appears stated  age Head: Normocephalic, without obvious abnormality, atraumatic Neck: no adenopathy, supple, symmetrical, trachea midline and thyroid normal to inspection and palpation Lungs: clear to auscultation bilaterally   Heart: regular rate and rhythm Abdomen: soft, non-tender; bowel sounds normal; no masses,  no organomegaly Extremities: extremities normal, atraumatic, no cyanosis or edema Skin: Skin color, texture, turgor normal. No rashes or lesions Lymph nodes: Cervical, supraclavicular, and axillary nodes normal. No abnormal inguinal nodes palpated Neurologic: Grossly normal   Pelvic: External genitalia:  no lesions              Urethra:  normal appearing urethra with no masses, tenderness or lesions              Bartholins and Skenes: normal                 Vagina: normal appearing vagina with normal color and discharge, no lesions              Cervix: absent              Pap taken: No. Bimanual Exam:  Uterus:  uterus absent              Adnexa: no mass, fullness, tenderness               Rectovaginal: Confirms               Anus:  normal sphincter tone, no lesions  Chaperone was present for exam.  Pelvic ultrasound - images and report reviewed with patient.   Uterus absent.  Left adnexa with 4.7 cm thick walled cyst with single septation 1.8 mm.  Avascular. Right adnexa absent.  Mild free fluid.    Assessment  Status post total abdominal hysterectomy.  Status post robotic bilateral salpingo-oophorectomy for benign bilateral serous cystadenomas. Left adnexal mass.  I suspect an ovarian remnant.  Plan  Will check CA125.  I have discussed my expectation for a laparoscopic removal of the left adnexal mass, possible using the robotic approach.  We discussed how the dissection will need to be a little more extensive to assure removal of the entire ovary.   Will refer to GYN ONC if CA125 is elevated.   45 minutes face to face time of which over 50% was spent in counseling.    After visit summary to patient.

## 2015-02-16 ENCOUNTER — Telehealth: Payer: Self-pay | Admitting: *Deleted

## 2015-02-16 LAB — CA 125: CA 125: 15 U/mL (ref <35)

## 2015-02-16 NOTE — Telephone Encounter (Signed)
-----   Message from [place], MD sent at 02/16/2015  9:41 AM EST ----- Please contact patient regarding her negative CA125.  Patient has a left ovarian remnant which I believe is going to be a recurrent benign serous cystadenoma. I would like to proceed with a robotic excision of left adnexal mass and collection of pelvic washings. Time will be at least 2 - 1.5 hours in the OR. I would select March 15 at the target date for surgery. This will need prior authorization AFTER you have spoken with the patient.   [place].

## 2015-02-16 NOTE — Telephone Encounter (Addendum)
Patient notified of negative CA 125 result as directed by Dr. Quincy Simmonds. Surgical date discussed and patient agreeable. Will proceed with scheduling and call patient back to confirm.

## 2015-02-19 ENCOUNTER — Telehealth: Payer: Self-pay | Admitting: Obstetrics and Gynecology

## 2015-02-19 NOTE — Telephone Encounter (Signed)
Call to patient. Advised surgery scheduled for Tuesday, 03/13/2015 7:30 at [facility]. Surgical instructions sheet reviewed and printed copy mailed to patient. Pre and postoperative appointment scheduled. Instructed to call prn.  Routing to provider for final review. Patient agreeable to disposition. Will close encounter

## 2015-02-26 ENCOUNTER — Encounter: Payer: Self-pay | Admitting: Obstetrics and Gynecology

## 2015-02-26 ENCOUNTER — Other Ambulatory Visit: Payer: Self-pay | Admitting: *Deleted

## 2015-02-26 ENCOUNTER — Ambulatory Visit (INDEPENDENT_AMBULATORY_CARE_PROVIDER_SITE_OTHER): Payer: Medicare PPO | Admitting: Obstetrics and Gynecology

## 2015-02-26 VITALS — BP 120/70 | HR 72 | Ht 61.75 in | Wt 185.6 lb

## 2015-02-26 DIAGNOSIS — N8320 Unspecified ovarian cysts: Secondary | ICD-10-CM

## 2015-02-26 DIAGNOSIS — N83202 Unspecified ovarian cyst, left side: Secondary | ICD-10-CM

## 2015-02-26 NOTE — Progress Notes (Signed)
GYNECOLOGY  VISIT   HPI:   69 y.o. G2P0011 MarriedCaucasianF here for pelvic cyst noted on CT scan.  Status post robotic bilateral salpingo-oophorectomy for bilateral benign serous cystadenomas in 2010 - Dr. Joan Flores.   Had CT scan done for bilateral kidney stones and a pelvic cyst was noted at the time.  Dr. Dutch Gray referred for evaluation of this.   Pelvic ultrasound 02/15/15: Uterus absent.  Left adnexa with 4.7 cm thick walled cyst with single septation 1.8 mm. Avascular. Right adnexa absent.  Mild free fluid.  CA125 was 15.   Had reaction to skin over suprapubic area following the ultrasound to check the mass.  Blistered.   Has IBS and has random abdominal and back pain.    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  Does better with liquid medications or medication she can cut into small pieces.   GYNECOLOGIC HISTORY: No LMP recorded. Patient has had a hysterectomy.  Status post TAH. Contraception:   Hysterectomy  Menopausal hormone therapy: Premarin 0.625 mg  Pap: 2010 WNL  History of abnormal Pap: no MMG: 04/06/14 Breast Density Category C Bi-Rads 0; 04/17/14 Digital Diagnostic Left MMG/ Left Breast Ultrasound Bi-Rads 2: Benign           OB History    Gravida Para Term Preterm AB TAB SAB Ectopic Multiple Living   2 1   1  1   1          Patient Active Problem List   Diagnosis Date Noted  . OTHER DISEASES OF VOCAL CORDS 01/01/2009  . GERD 01/01/2009  . COUGH 01/01/2009    Past Medical History  Diagnosis Date  . Hypertension   . IBS (irritable bowel syndrome)   . GERD (gastroesophageal reflux disease)   . Kidney stones   . Headache(784.0)     migraines  . Cataract immature   . PONV (postoperative nausea and vomiting)   . Anxiety     takes xanax  . Melanoma     left eye    Past Surgical History  Procedure Laterality Date  . Abdominal hysterectomy  1978    partial  . Bilateral salpingoophorectomy   2010    with robot  . Cholecystectomy  2005    lap  . Tonsillectomy      as child  . Tubal ligation  1976  . Lithotripsy      20 years ago    Current Outpatient Prescriptions  Medication Sig Dispense Refill  . ALPRAZolam (XANAX) 1 MG tablet Take 1 mg by mouth at bedtime as needed for anxiety or sleep. anxiety    . Cholecalciferol (VITAMIN D3) 5000 UNITS CAPS Take 5,000 Units by mouth daily.     . diphenhydrAMINE (BENADRYL) 25 MG tablet Take 25 mg by mouth at bedtime as needed for allergies or sleep. sleep    . estrogens, conjugated, (PREMARIN) 0.625 MG tablet Take 0.625 mg by mouth 2 (two) times daily.     . famotidine (PEPCID) 20 MG tablet Take 20 mg by mouth as needed. For indigestion    . furosemide (LASIX) 40 MG tablet Take 40 mg by mouth as needed for fluid. For fluid    . hydrochlorothiazide (HYDRODIURIL) 12.5 MG tablet Take 12.5 mg by mouth daily at 12 noon. Takes around lunchtime    . hydrochlorothiazide (HYDRODIURIL) 25 MG tablet Take 25 mg by mouth daily with breakfast.     . hydrocortisone-pramoxine (PRAMOSONE) 1-2.5 %  LOTN Apply 1 application topically daily as needed (rash).    . loratadine (CLARITIN) 10 MG tablet Take 10 mg by mouth daily.    . metroNIDAZOLE (METROCREAM) 0.75 % cream Apply 1 application topically daily as needed (rash).    . montelukast (SINGULAIR) 10 MG tablet Take 10 mg by mouth at bedtime.    . naratriptan (AMERGE) 2.5 MG tablet Take 2.5 mg by mouth as needed for migraine. Take one (1) tablet at onset of headache; if returns or does not resolve, may repeat after 4 hours; do not exceed five (5) mg in 24 hours.    . Polyvinyl Alcohol-Povidone (REFRESH OP) Place 1 drop into both eyes daily as needed (dry eyes).      No current facility-administered medications for this visit.     ALLERGIES: Bystolic; Latex; Oxycodone; Prednisone; Adhesive; Aspirin; Codeine; Elavil; Other; Cephalosporins; Ciprofloxacin; and Penicillins  SOCIAL HISTORY Denies tobacco.   ETOH - yes.  Illicit drugs - no.  Family History  Problem Relation Age of Onset  . Adopted: Yes  . Diabetes Maternal Grandmother   . Hypertension Mother   . Hypertension Maternal Grandmother   . Hypertension Maternal Grandfather   . Stroke Maternal Grandfather   . Stroke Maternal Uncle   . Stroke Maternal Uncle   . Heart attack Maternal Grandmother   . Heart attack Maternal Uncle   . Heart attack Maternal Uncle   . Lung cancer Maternal Uncle   . Thyroid disease Mother     ROS:  Pertinent items are noted in HPI.  PHYSICAL EXAMINATION:    Ht 5' 1.75" (1.568 m)     General appearance: alert, cooperative and appears stated age Neck:  Thyroid nonenlarged, nontender.  Lungs: clear to auscultation bilaterally Heart: regular rate and rhythm Abdomen: scattered incisions, soft, non-tender; no masses,  no organomegaly No abnormal inguinal nodes palpated  Pelvic: External genitalia:  no lesions              Urethra:  normal appearing urethra with no masses, tenderness or lesions              Bartholins and Skenes: normal                 Vagina: normal appearing vagina with normal color and discharge, no lesions              Cervix: absent                   Bimanual Exam:  Uterus:   absent                                      Adnexa:  Fullness of left adnexa.                                      Rectovaginal:  Yes.                                                                Confirms  Anus:  normal sphincter tone, no lesions  ASSESSMENT  Status post total abdominal hysterectomy.  Status post robotic bilateral salpingo-oophorectomy for benign bilateral serous cystadenomas. Left adnexal mass. I suspect an ovarian remnant and a serous cystadenoma.   PLAN  Proceed with a robotic laparoscopic excision of a left ovarian remnant, collection of pelvic washings, and lysis of adhesions.  I have discussed benefits and risks including but not  limited to bleeding, infection, damage to surrounding organs, reaction to anesthesia, pneumonia, neuropathy, hernia formation, DVT, PE, death, need for reoperation, and need for GYN oncology care if an unsuspected malignancy were found.  She wishes to proceed.  She will do a magnesium citrate bowel prep day prior to surgery and do clear liquids the day prior to surgery.  Surgical expectations and recovery discussed. Will have anesthesia consultation arranged at hospital.   An After Visit Summary was printed and given to the patient.  _25_____ minutes face to face time of which over 50% was spent in counseling.

## 2015-02-26 NOTE — Patient Instructions (Signed)
Please complete the magnesium citrate bowel prep the day prior to surgery.  You may have only clear liquids on the day prior to surgery.

## 2015-02-27 ENCOUNTER — Telehealth: Payer: Self-pay | Admitting: *Deleted

## 2015-02-27 DIAGNOSIS — R7309 Other abnormal glucose: Secondary | ICD-10-CM

## 2015-02-27 HISTORY — DX: Other abnormal glucose: R73.09

## 2015-02-27 NOTE — Telephone Encounter (Signed)
-----   Message from [place], MD sent at 02/26/2015  6:52 PM EST ----- Regarding: patient needs anesthesia consultaiton at her hospital preop visit.  Please arrange for anesthesia consultation for this patient for her upcoming surgery.  She states she has a partial cleft palate and a bifurcated uvula.  Thank you!  Victoria Andrade

## 2015-02-27 NOTE — Telephone Encounter (Signed)
Patient is scheduled for PAT appointment on 03-08-15 at 1030. Call to [facility] at hospital and advised patient will need anesthesia consult at PAT appointment per Dr Elza Rafter instructions below. Marcie Bal states they will be able to complete this at scheduled pre-op appointment.  Routing to provider for final review. Will close encounter

## 2015-03-01 NOTE — Pre-Procedure Instructions (Signed)
Per Dr. Quincy Simmonds, patient needs to see anesthesia at her PAT appt.  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 Does better with liquid medications or medication she can cut into small pieces.

## 2015-03-06 NOTE — Telephone Encounter (Signed)
I agree with your recommendations for the patient to contact her PCP and report back to [place].

## 2015-03-06 NOTE — Telephone Encounter (Signed)
Return call to patient. She reports that she has started with very typical upper respiratory infection symptoms and needs to know what medications she can take. Symptoms began yesterday with sore throat, congestion and cough. She states that when she gets this, it usually progresses into her chest and takes her a couple of month to fully recover from. Advised she may take decongestant, mucinex, cough suppressant and tylenol. Should not take cold medicine with Advil or ASA. Recommended she proceed with calling PCP for evaluation and determine if additional treatment is needed. Advised that if symptoms persist or worsen, anesthesia may have to cancel surgery so best to treat early if possible. Patient agrees to call PCP and will keep me updated.  Routing to provider for final review.

## 2015-03-06 NOTE — Telephone Encounter (Signed)
Patient is calling to speak with Gay Filler about surgery.

## 2015-03-07 DIAGNOSIS — J069 Acute upper respiratory infection, unspecified: Secondary | ICD-10-CM | POA: Diagnosis not present

## 2015-03-07 DIAGNOSIS — E6609 Other obesity due to excess calories: Secondary | ICD-10-CM | POA: Diagnosis not present

## 2015-03-07 DIAGNOSIS — H6990 Unspecified Eustachian tube disorder, unspecified ear: Secondary | ICD-10-CM | POA: Diagnosis not present

## 2015-03-07 DIAGNOSIS — Z6833 Body mass index (BMI) 33.0-33.9, adult: Secondary | ICD-10-CM | POA: Diagnosis not present

## 2015-03-07 NOTE — Telephone Encounter (Signed)
Patient is returning a call to Sally. °

## 2015-03-08 ENCOUNTER — Encounter (HOSPITAL_COMMUNITY): Payer: Self-pay

## 2015-03-08 ENCOUNTER — Encounter (HOSPITAL_COMMUNITY)
Admission: RE | Admit: 2015-03-08 | Discharge: 2015-03-08 | Disposition: A | Discharge status: Home / Self Care | Payer: Medicare PPO | Source: Ambulatory Visit | Attending: Obstetrics and Gynecology | Admitting: Obstetrics and Gynecology

## 2015-03-08 DIAGNOSIS — Z01818 Encounter for other preprocedural examination: Secondary | ICD-10-CM | POA: Insufficient documentation

## 2015-03-08 DIAGNOSIS — R1909 Other intra-abdominal and pelvic swelling, mass and lump: Secondary | ICD-10-CM | POA: Diagnosis not present

## 2015-03-08 LAB — BASIC METABOLIC PANEL
Anion gap: 10 (ref 5–15)
BUN: 16 mg/dL (ref 6–23)
CHLORIDE: 100 mmol/L (ref 96–112)
CO2: 28 mmol/L (ref 19–32)
Calcium: 9.3 mg/dL (ref 8.4–10.5)
Creatinine, Ser: 0.7 mg/dL (ref 0.50–1.10)
GFR calc Af Amer: >90 mL/min (ref >90)
GFR calc non Af Amer: 88 mL/min — ABNORMAL LOW (ref >90)
GLUCOSE: 148 mg/dL — AB (ref 70–99)
POTASSIUM: 3.5 mmol/L (ref 3.5–5.1)
Sodium: 138 mmol/L (ref 135–145)

## 2015-03-08 LAB — CBC
HCT: 43.5 % (ref 36.0–46.0)
HEMOGLOBIN: 14.6 g/dL (ref 12.0–15.0)
MCH: 30.4 pg (ref 26.0–34.0)
MCHC: 33.6 g/dL (ref 30.0–36.0)
MCV: 90.4 fL (ref 78.0–100.0)
Platelets: 311 10*3/uL (ref 150–400)
RBC: 4.81 MIL/uL (ref 3.87–5.11)
RDW: 13.6 % (ref 11.5–15.5)
WBC: 12.2 10*3/uL — AB (ref 4.0–10.5)

## 2015-03-08 NOTE — Anesthesia Preprocedure Evaluation (Addendum)
Anesthesia Evaluation  Patient identified by MRN, date of birth, ID band Patient awake    Reviewed: Allergy & Precautions, NPO status , Patient's Chart, lab work & pertinent test results  History of Anesthesia Complications (+) PONV and history of anesthetic complications  Airway Mallampati: II  TM Distance: >3 FB Neck ROM: Full    Dental no notable dental hx. (+) Caps, Teeth Intact, Dental Advisory Given   Pulmonary neg pulmonary ROS,  breath sounds clear to auscultation  Pulmonary exam normal       Cardiovascular hypertension, Pt. on medications Rhythm:Regular Rate:Normal     Neuro/Psych  Headaches, Anxiety    GI/Hepatic Neg liver ROS, GERD-  Medicated and Controlled,  Endo/Other  Obesity  Renal/GU Renal diseaseRenal calculi  negative genitourinary   Musculoskeletal negative musculoskeletal ROS (+)   Abdominal Normal abdominal exam  (+) + obese,   Peds  Hematology negative hematology ROS (+)   Anesthesia Other Findings Cleft uvula  Reproductive/Obstetrics Left adnexal mass                           Anesthesia Physical Anesthesia Plan  ASA: II  Anesthesia Plan: General   Post-op Pain Management:    Induction: Intravenous and Cricoid pressure planned  Airway Management Planned: Oral ETT  Additional Equipment:   Intra-op Plan:   Post-operative Plan: Extubation in OR  Informed Consent: I have reviewed the patients History and Physical, chart, labs and discussed the procedure including the risks, benefits and alternatives for the proposed anesthesia with the patient or authorized representative who has indicated his/her understanding and acceptance.   Dental advisory given  Plan Discussed with: CRNA, Anesthesiologist and Surgeon  Anesthesia Plan Comments:         Anesthesia Quick Evaluation

## 2015-03-08 NOTE — Patient Instructions (Signed)
Your procedure is scheduled on:03/13/15  Enter through the Main Entrance at :6am Pick up desk phone and dial [phone] and inform us of your arrival.  Please call [phone] if you have any problems the morning of surgery.  Remember: Do not eat food or drink liquids, including water, after midnight:Monday   You may brush your teeth the morning of surgery.  Take these meds the morning of surgery with a sip of water: Pepcid, HCTZ, Claritin  DO NOT wear jewelry, eye make-up, lipstick,body lotion, or dark fingernail polish.  (Polished toes are ok) You may wear deodorant.  If you are to be admitted after surgery, leave suitcase in car until your room has been assigned. Patients discharged on the day of surgery will not be allowed to drive home. Wear loose fitting, comfortable clothes for your ride home.

## 2015-03-08 NOTE — Telephone Encounter (Signed)
Return call to patient. She was seen at PCP office yesterday. She was told this is just viral URI. Was given Z-pack and Tussionex cough med. Neg strep culture and lungs were clear. Has been to pre-op at hospital today and was evaluated by Dr Royce Macadamia who will also be running her case on Tuesday. Per patient, he evaluated her airway (due to her history of partial cleft palate and bifurcated uvula) as well as respiratory status and feels she is ok for surgery.

## 2015-03-09 LAB — HEMOGLOBIN A1C
Hgb A1c MFr Bld: 6.2 % — ABNORMAL HIGH (ref 4.8–5.6)
MEAN PLASMA GLUCOSE: 131 mg/dL

## 2015-03-09 NOTE — Telephone Encounter (Signed)
Call to patient. States she is feeling about the same. No better but no worse. Cough persists and she is taking medication and resting. Advised of blood glucose and Hgb A1C results as directed by Dr Quincy Simmonds. Advised Dr Quincy Simmonds recommends follow-up with PCP for evaluation. See result note. Patient states PCP, Dr Hilma Favors, [facility] in [place], out of office today but she will call them next week after surgery. Advised we will forward results to their office. Patient is instructed if she feels cold symptoms get worse over weekend, she should call on Monday, otherwise Dr Quincy Simmonds will see her Tuesday for surgery.  Routing to provider for final review. Patient agreeable to disposition. Will close encounter

## 2015-03-13 ENCOUNTER — Ambulatory Visit (HOSPITAL_COMMUNITY): Payer: Medicare PPO | Admitting: Anesthesiology

## 2015-03-13 ENCOUNTER — Encounter (HOSPITAL_COMMUNITY)
Admission: RE | Disposition: A | Discharge status: Home / Self Care | Payer: Self-pay | Source: Ambulatory Visit | Attending: Obstetrics and Gynecology

## 2015-03-13 ENCOUNTER — Ambulatory Visit (HOSPITAL_COMMUNITY)
Admission: RE | Admit: 2015-03-13 | Discharge: 2015-03-13 | Disposition: A | Discharge status: Home / Self Care | Payer: Medicare PPO | Source: Ambulatory Visit | Attending: Obstetrics and Gynecology | Admitting: Obstetrics and Gynecology

## 2015-03-13 DIAGNOSIS — I10 Essential (primary) hypertension: Secondary | ICD-10-CM | POA: Diagnosis not present

## 2015-03-13 DIAGNOSIS — D271 Benign neoplasm of left ovary: Secondary | ICD-10-CM | POA: Insufficient documentation

## 2015-03-13 DIAGNOSIS — Z888 Allergy status to other drugs, medicaments and biological substances status: Secondary | ICD-10-CM | POA: Insufficient documentation

## 2015-03-13 DIAGNOSIS — G43909 Migraine, unspecified, not intractable, without status migrainosus: Secondary | ICD-10-CM | POA: Diagnosis not present

## 2015-03-13 DIAGNOSIS — Z91040 Latex allergy status: Secondary | ICD-10-CM | POA: Insufficient documentation

## 2015-03-13 DIAGNOSIS — Z79899 Other long term (current) drug therapy: Secondary | ICD-10-CM | POA: Insufficient documentation

## 2015-03-13 DIAGNOSIS — D487 Neoplasm of uncertain behavior of other specified sites: Secondary | ICD-10-CM | POA: Diagnosis not present

## 2015-03-13 DIAGNOSIS — D279 Benign neoplasm of unspecified ovary: Secondary | ICD-10-CM | POA: Diagnosis not present

## 2015-03-13 DIAGNOSIS — Z87442 Personal history of urinary calculi: Secondary | ICD-10-CM | POA: Insufficient documentation

## 2015-03-13 DIAGNOSIS — Z881 Allergy status to other antibiotic agents status: Secondary | ICD-10-CM | POA: Insufficient documentation

## 2015-03-13 DIAGNOSIS — K589 Irritable bowel syndrome without diarrhea: Secondary | ICD-10-CM | POA: Diagnosis not present

## 2015-03-13 DIAGNOSIS — H269 Unspecified cataract: Secondary | ICD-10-CM | POA: Diagnosis not present

## 2015-03-13 DIAGNOSIS — K660 Peritoneal adhesions (postprocedural) (postinfection): Secondary | ICD-10-CM | POA: Diagnosis not present

## 2015-03-13 DIAGNOSIS — F419 Anxiety disorder, unspecified: Secondary | ICD-10-CM | POA: Diagnosis not present

## 2015-03-13 DIAGNOSIS — R229 Localized swelling, mass and lump, unspecified: Secondary | ICD-10-CM | POA: Diagnosis present

## 2015-03-13 DIAGNOSIS — K219 Gastro-esophageal reflux disease without esophagitis: Secondary | ICD-10-CM | POA: Insufficient documentation

## 2015-03-13 DIAGNOSIS — Z880 Allergy status to penicillin: Secondary | ICD-10-CM | POA: Diagnosis not present

## 2015-03-13 HISTORY — PX: CYSTOSCOPY: SHX5120

## 2015-03-13 HISTORY — PX: ROBOTIC ASSISTED SALPINGO OOPHERECTOMY: SHX6082

## 2015-03-13 LAB — GLUCOSE, CAPILLARY: GLUCOSE-CAPILLARY: 106 mg/dL — AB (ref 70–99)

## 2015-03-13 SURGERY — ROBOTIC ASSISTED SALPINGO OOPHORECTOMY
Anesthesia: General | Site: Ureter

## 2015-03-13 MED ORDER — NEOSTIGMINE METHYLSULFATE 10 MG/10ML IV SOLN
INTRAVENOUS | Status: AC
  Filled 2015-03-13: qty 1

## 2015-03-13 MED ORDER — LIDOCAINE HCL (CARDIAC) 20 MG/ML IV SOLN
INTRAVENOUS | Status: AC
  Filled 2015-03-13: qty 5

## 2015-03-13 MED ORDER — NEOSTIGMINE METHYLSULFATE 10 MG/10ML IV SOLN
INTRAVENOUS | Status: DC | PRN
  Administered 2015-03-13: 4 mg via INTRAVENOUS

## 2015-03-13 MED ORDER — FENTANYL CITRATE 0.05 MG/ML IJ SOLN
25.0000 ug | INTRAMUSCULAR | Status: DC | PRN
Start: 2015-03-13 — End: 2015-03-13

## 2015-03-13 MED ORDER — MIDAZOLAM HCL 2 MG/2ML IJ SOLN
INTRAMUSCULAR | Status: DC | PRN
  Administered 2015-03-13: 2 mg via INTRAVENOUS

## 2015-03-13 MED ORDER — FENTANYL CITRATE 0.05 MG/ML IJ SOLN: 25.0000 ug | INTRAMUSCULAR | Status: DC | PRN

## 2015-03-13 MED ORDER — HEPARIN SODIUM (PORCINE) 5000 UNIT/ML IJ SOLN
INTRAMUSCULAR | Status: AC
  Filled 2015-03-13: qty 1

## 2015-03-13 MED ORDER — ONDANSETRON HCL 4 MG/2ML IJ SOLN
INTRAMUSCULAR | Status: AC
  Filled 2015-03-13: qty 2

## 2015-03-13 MED ORDER — MEPERIDINE HCL 25 MG/ML IJ SOLN: 6.2500 mg | INTRAMUSCULAR | Status: DC | PRN

## 2015-03-13 MED ORDER — FENTANYL CITRATE 0.05 MG/ML IJ SOLN
INTRAMUSCULAR | Status: AC
  Filled 2015-03-13: qty 5

## 2015-03-13 MED ORDER — METOCLOPRAMIDE HCL 5 MG/ML IJ SOLN: 10.0000 mg | Freq: Once | INTRAMUSCULAR | Status: DC | PRN

## 2015-03-13 MED ORDER — MIDAZOLAM HCL 2 MG/2ML IJ SOLN
INTRAMUSCULAR | Status: AC
  Filled 2015-03-13: qty 2

## 2015-03-13 MED ORDER — SCOPOLAMINE 1 MG/3DAYS TD PT72
1.0000 | MEDICATED_PATCH | TRANSDERMAL | Status: DC
  Administered 2015-03-13: 1.5 mg via TRANSDERMAL

## 2015-03-13 MED ORDER — LACTATED RINGERS IR SOLN
Status: DC | PRN
  Administered 2015-03-13: 3000 mL

## 2015-03-13 MED ORDER — PROPOFOL 10 MG/ML IV BOLUS
INTRAVENOUS | Status: AC
  Filled 2015-03-13: qty 20

## 2015-03-13 MED ORDER — LIDOCAINE HCL (CARDIAC) 20 MG/ML IV SOLN
INTRAVENOUS | Status: DC | PRN
  Administered 2015-03-13: 60 mg via INTRAVENOUS

## 2015-03-13 MED ORDER — GLYCOPYRROLATE 0.2 MG/ML IJ SOLN
INTRAMUSCULAR | Status: AC
  Filled 2015-03-13: qty 3

## 2015-03-13 MED ORDER — GLYCOPYRROLATE 0.2 MG/ML IJ SOLN
INTRAMUSCULAR | Status: DC | PRN
  Administered 2015-03-13: 0.6 mg via INTRAVENOUS

## 2015-03-13 MED ORDER — SUCCINYLCHOLINE CHLORIDE 20 MG/ML IJ SOLN
INTRAMUSCULAR | Status: DC | PRN
  Administered 2015-03-13: 100 mg via INTRAVENOUS

## 2015-03-13 MED ORDER — ROCURONIUM BROMIDE 100 MG/10ML IV SOLN
INTRAVENOUS | Status: AC
  Filled 2015-03-13: qty 1

## 2015-03-13 MED ORDER — BUPIVACAINE HCL (PF) 0.25 % IJ SOLN
INTRAMUSCULAR | Status: DC | PRN
  Administered 2015-03-13: 10 mL

## 2015-03-13 MED ORDER — ONDANSETRON HCL 4 MG/2ML IJ SOLN
INTRAMUSCULAR | Status: DC | PRN
  Administered 2015-03-13: 4 mg via INTRAVENOUS

## 2015-03-13 MED ORDER — CLINDAMYCIN PHOSPHATE 900 MG/50ML IV SOLN
900.0000 mg | Freq: Once | INTRAVENOUS | Status: AC
  Administered 2015-03-13: 900 mg via INTRAVENOUS
  Filled 2015-03-13: qty 50

## 2015-03-13 MED ORDER — LIDOCAINE HCL (CARDIAC) 20 MG/ML IV SOLN: INTRAVENOUS | Status: DC | PRN

## 2015-03-13 MED ORDER — BUPIVACAINE HCL (PF) 0.25 % IJ SOLN
INTRAMUSCULAR | Status: AC
  Filled 2015-03-13: qty 30

## 2015-03-13 MED ORDER — SODIUM CHLORIDE 0.9 % IJ SOLN
INTRAMUSCULAR | Status: AC
Start: 2015-03-13 — End: 2015-03-13
  Filled 2015-03-13: qty 10

## 2015-03-13 MED ORDER — SCOPOLAMINE 1 MG/3DAYS TD PT72
MEDICATED_PATCH | TRANSDERMAL | Status: AC
  Administered 2015-03-13: 1.5 mg via TRANSDERMAL
  Filled 2015-03-13: qty 1

## 2015-03-13 MED ORDER — METOCLOPRAMIDE HCL 5 MG/ML IJ SOLN
INTRAMUSCULAR | Status: AC
  Administered 2015-03-13: 10 mg
  Filled 2015-03-13: qty 2

## 2015-03-13 MED ORDER — HYDROCODONE-ACETAMINOPHEN 7.5-325 MG/15ML PO SOLN: 15.0000 mL | Freq: Four times a day (QID) | ORAL | Status: DC | PRN

## 2015-03-13 MED ORDER — PROPOFOL 10 MG/ML IV BOLUS
INTRAVENOUS | Status: DC | PRN
  Administered 2015-03-13: 150 mg via INTRAVENOUS

## 2015-03-13 MED ORDER — FENTANYL CITRATE 0.05 MG/ML IJ SOLN
INTRAMUSCULAR | Status: AC
  Filled 2015-03-13: qty 2

## 2015-03-13 MED ORDER — DEXAMETHASONE SODIUM PHOSPHATE 4 MG/ML IJ SOLN
INTRAMUSCULAR | Status: AC
  Filled 2015-03-13: qty 1

## 2015-03-13 MED ORDER — DEXAMETHASONE SODIUM PHOSPHATE 10 MG/ML IJ SOLN
INTRAMUSCULAR | Status: DC | PRN
  Administered 2015-03-13: 4 mg via INTRAVENOUS

## 2015-03-13 MED ORDER — SODIUM CHLORIDE 0.9 % IJ SOLN
INTRAMUSCULAR | Status: AC
  Filled 2015-03-13: qty 50

## 2015-03-13 MED ORDER — SUCCINYLCHOLINE CHLORIDE 20 MG/ML IJ SOLN
INTRAMUSCULAR | Status: AC
  Filled 2015-03-13: qty 10

## 2015-03-13 MED ORDER — ROPIVACAINE HCL 5 MG/ML IJ SOLN
INTRAMUSCULAR | Status: AC
  Filled 2015-03-13: qty 60

## 2015-03-13 MED ORDER — LACTATED RINGERS IV SOLN
INTRAVENOUS | Status: DC
  Administered 2015-03-13 (×4): via INTRAVENOUS

## 2015-03-13 MED ORDER — FENTANYL CITRATE 0.05 MG/ML IJ SOLN
INTRAMUSCULAR | Status: DC | PRN
  Administered 2015-03-13 (×2): 50 ug via INTRAVENOUS
  Administered 2015-03-13: 100 ug via INTRAVENOUS
  Administered 2015-03-13: 50 ug via INTRAVENOUS
  Administered 2015-03-13 (×2): 100 ug via INTRAVENOUS
  Administered 2015-03-13: 50 ug via INTRAVENOUS

## 2015-03-13 MED ORDER — ROCURONIUM BROMIDE 100 MG/10ML IV SOLN
INTRAVENOUS | Status: DC | PRN
  Administered 2015-03-13: 40 mg via INTRAVENOUS

## 2015-03-13 MED ORDER — LACTATED RINGERS IV SOLN: INTRAVENOUS | Status: DC

## 2015-03-13 SURGICAL SUPPLY — 53 items
BAG SPEC RTRVL LRG 6X4 10 (ENDOMECHANICALS) ×2
BARRIER ADHS 3X4 INTERCEED (GAUZE/BANDAGES/DRESSINGS) IMPLANT
BRR ADH 4X3 ABS CNTRL BYND (GAUZE/BANDAGES/DRESSINGS)
CATH FOLEY 3WAY  5CC 16FR (CATHETERS) ×2
CATH FOLEY 3WAY 5CC 16FR (CATHETERS) ×2 IMPLANT
CLOTH BEACON ORANGE TIMEOUT ST (SAFETY) ×4 IMPLANT
CONT PATH 16OZ SNAP LID 3702 (MISCELLANEOUS) ×8 IMPLANT
COVER BACK TABLE 60X90IN (DRAPES) ×8 IMPLANT
COVER TIP SHEARS 8 DVNC (MISCELLANEOUS) ×4 IMPLANT
COVER TIP SHEARS 8MM DA VINCI (MISCELLANEOUS) ×4
DECANTER SPIKE VIAL GLASS SM (MISCELLANEOUS) ×8 IMPLANT
DRAPE WARM FLUID 44X44 (DRAPE) ×4 IMPLANT
DURAPREP 26ML APPLICATOR (WOUND CARE) ×4 IMPLANT
ELECT REM PT RETURN 9FT ADLT (ELECTROSURGICAL) ×4
ELECTRODE REM PT RTRN 9FT ADLT (ELECTROSURGICAL) ×2 IMPLANT
GAUZE VASELINE 3X9 (GAUZE/BANDAGES/DRESSINGS) IMPLANT
GLOVE BIO SURGEON STRL SZ 6.5 (GLOVE) ×9 IMPLANT
GLOVE BIO SURGEONS STRL SZ 6.5 (GLOVE) ×3
GLOVE BIOGEL PI IND STRL 6.5 (GLOVE) ×2 IMPLANT
GLOVE BIOGEL PI IND STRL 7.0 (GLOVE) ×4 IMPLANT
GLOVE BIOGEL PI INDICATOR 6.5 (GLOVE) ×2
GLOVE BIOGEL PI INDICATOR 7.0 (GLOVE) ×4
KIT ACCESSORY DA VINCI DISP (KITS) ×2
KIT ACCESSORY DVNC DISP (KITS) ×2 IMPLANT
LEGGING LITHOTOMY PAIR STRL (DRAPES) ×4 IMPLANT
LIQUID BAND (GAUZE/BANDAGES/DRESSINGS) ×4 IMPLANT
OCCLUDER COLPOPNEUMO (BALLOONS) IMPLANT
PACK ROBOT WH (CUSTOM PROCEDURE TRAY) ×4 IMPLANT
PACK ROBOTIC GOWN (GOWN DISPOSABLE) ×4 IMPLANT
PAD POSITIONER PINK NONSTERILE (MISCELLANEOUS) ×4 IMPLANT
PAD PREP 24X48 CUFFED NSTRL (MISCELLANEOUS) ×8 IMPLANT
POUCH SPECIMEN RETRIEVAL 10MM (ENDOMECHANICALS) ×2 IMPLANT
SET BI-LUMEN FLTR TB AIRSEAL (TUBING) ×2 IMPLANT
SET CYSTO W/LG BORE CLAMP LF (SET/KITS/TRAYS/PACK) ×2 IMPLANT
SET IRRIG TUBING LAPAROSCOPIC (IRRIGATION / IRRIGATOR) ×8 IMPLANT
SUT VIC AB 0 CT2 27 (SUTURE) ×20 IMPLANT
SUT VIC AB 4-0 PS2 27 (SUTURE) ×8 IMPLANT
SUT VICRYL 0 UR6 27IN ABS (SUTURE) ×8 IMPLANT
SUT VLOC 180 0 9IN  GS21 (SUTURE)
SUT VLOC 180 0 9IN GS21 (SUTURE) IMPLANT
SYR 50ML LL SCALE MARK (SYRINGE) ×8 IMPLANT
SYSTEM CONVERTIBLE TROCAR (TROCAR) IMPLANT
TIP RUMI ORANGE 6.7MMX12CM (TIP) IMPLANT
TIP UTERINE 5.1X6CM LAV DISP (MISCELLANEOUS) IMPLANT
TIP UTERINE 6.7X10CM GRN DISP (MISCELLANEOUS) IMPLANT
TIP UTERINE 6.7X6CM WHT DISP (MISCELLANEOUS) IMPLANT
TIP UTERINE 6.7X8CM BLUE DISP (MISCELLANEOUS) IMPLANT
TOWEL OR 17X24 6PK STRL BLUE (TOWEL DISPOSABLE) ×12 IMPLANT
TROCAR DILATING TIP 12MM 150MM (ENDOMECHANICALS) ×4 IMPLANT
TROCAR DISP BLADELESS 8 DVNC (TROCAR) ×2 IMPLANT
TROCAR DISP BLADELESS 8MM (TROCAR) ×2
WARMER LAPAROSCOPE (MISCELLANEOUS) ×4 IMPLANT
WATER STERILE IRR 1000ML POUR (IV SOLUTION) ×12 IMPLANT

## 2015-03-13 NOTE — Op Note (Signed)
Victoria Andrade, Victoria Andrade NO.:  [id number]  MEDICAL RECORD NO.:  [id number]  LOCATION:  [facility]                          FACILITY:  [facility]  PHYSICIAN:  Lenard Galloway, M.D.   DATE OF BIRTH:  1946/03/25  DATE OF PROCEDURE:  03/13/2015 DATE OF DISCHARGE:  03/13/2015                              OPERATIVE REPORT   PREOPERATIVE DIAGNOSIS:  Left pelvic mass.  POSTOPERATIVE DIAGNOSIS:  Left pelvic mass.  PROCEDURES:  Robotic laparoscopic excision of left pelvic mass, lysis of adhesions, collection of pelvic washings.  SURGEON:  Lenard Galloway, M.D.  ASSISTANT:  Felipa Emory, MD  ANESTHESIA:  General endotracheal, local with 10 mL of 0.25%.  IV FLUIDS:  1000 mL Ringer's lactate.  ESTIMATED BLOOD LOSS:  10 mL.  URINE OUTPUT:  100 mL.  COMPLICATIONS:  None.  INDICATIONS FOR THE PROCEDURE:  The patient is a 69 year old gravida 2, para 1, Caucasian female, who presented with a left pelvic cyst, which was noted on a CT scan ordered for evaluation of nephrolithiasis.  The patient was noted to have bilateral kidney stones.  A left adnexal mass was noted and the patient was sent for further evaluation and treatment by her urologist, Dr. Raynelle Bring.  Of note, the patient is status post total abdominal hysterectomy at 69 years of age and she is status post robotic laparoscopic bilateral salpingo-oophorectomy in 2010 for bilateral benign serous cystadenomas. The patient's pelvic ultrasound in the office documented a 4.7-cm thick walled left adnexal mass with a single septation measuring 1.8 mm, it was avascular.  The right adnexa was absent and there was mild free fluid in the pelvis.  A CA-125 measured 15.  A plan was made to proceed with a laparoscopic robotic excision of left pelvic mass, which was thought to be consistent with an ovarian remnant and a recurrent benign serous cystadenoma.  Risks, benefits, and alternatives were reviewed with the patient who wishes  to proceed.  FINDINGS:  Laparoscopy demonstrated a 4-cm left pelvic sidewall cyst, which had of a smooth surface to it.  There was no evidence of any excrescences or papillations in the abdomen or the pelvis.  The left fallopian tube, right adnexal and uterus were absent.  There were some fairly dense adhesions of the cecum to the right anterior midabdominal wall.  These were only partially lysed, but the area where there were dense adhesions were left in place.  The lysis of these adhesions assisted with placement of the right lower quadrant assistant port.  In the upper abdomen, the liver appeared unremarkable.  The gallbladder was not well visualized.  There was no further evidence of any adhesive disease.  Cystoscopy at termination of the procedure documented the bladder to be intact throughout 360 degrees including the bladder dome and trigone. There was no evidence of cystotomy.  The ureters were noted to be patent bilaterally.  The urethra was unremarkable.  There was no evidence of any bladder stones.  DESCRIPTION OF PROCEDURE:  The patient was reidentified in the preoperative hold area.  She received clindamycin 900 mg IV for antibiotic prophylaxis.  She received TED hose and PAS stockings for DVT  prophylaxis.  In the operating room, the patient was placed in the dorsal lithotomy position with her arms at her sides.  General endotracheal anesthesia was induced and the patient's abdomen and vagina were then sterilely prepped and she was draped.  A Foley catheter was placed inside the bladder and a ring forceps with a sponge was placed inside the vagina and later removed at termination of the case.  The procedure began by creating a small 3-mm incision in the umbilicus after injection with 0.25% Marcaine.  A Veress needle was inserted into the peritoneal cavity and the saline drop test was performed.  The fluid flowed freely.  A pneumoperitoneum was then achieved with CO2  gas.  A 12- mm supraumbilical incision was created with a scalpel after injecting the skin locally with 0.25% Marcaine.  A 12-mm trocar was placed inside the peritoneal cavity without difficulty.  The 8-mm robotic ports were then placed approximately 10 cm to the right and left of the laparoscopic port site.  The skin was injected locally with 0.25% Marcaine and the trocars were placed under visualization of the laparoscope.  Please note that prior to placing the robot in place that the Nezhat instrument was used to collect pelvic washings and these were then sent to Pathology.  The patient was placed in Trendelenburg position at this time.  The robot was docked.  The monopolar scissors was placed through the right robotic port and the PK instrument was placed through the left robotic port.  At this point, the assistant stayed at the bedside while I went over to the surgeon's console.  The procedure began with evaluation of the adhesions in the right midquadrant.  These were partially lysed using a monopolar scissors.  This allowed visualization of the right midabdomen abdominal wall where the 5-mm assistant trocar was placed after the skin was injected locally with 0.25% Marcaine.  The trocar was placed under visualization of the laparoscope.  Attention was then turned to the left pelvic sidewall.  The ureter could not be well visualized due to the position of the pelvic mass.  Using sharp dissection, the mass was shelled out from the peritoneum that covered over it inferiorly heading toward the rectal area.  This allowed the mass to be elevated.  The ureter could then be identified along the left pelvic sidewall and was noted to peristalse.  The remaining left infundibulopelvic ligament at this time could be cauterized with the bipolar cautery and it could be then bisected with the laparoscopic scissors.  The remainder of the cyst was excised from the retroperitoneum again using  sharp and blunt dissection and bipolar cautery when necessary to cauterize and then cut thicker tissue.  The position of the ureter was confirmed during this dissection and it was noted to be away from the dissection.  Please note that during the course of the dissection of the adnexal mass, there was a small amount of leakage of fluid into the peritoneal cavity.  The robot was undocked.  At this time, a 5-mm laparoscope was placed through one of the robotic 8-mm port sites.  An EndoCatch was used to collect the specimen and removed it from the peritoneal cavity.  The pelvis was irrigated and suctioned and was found to be hemostatic and the laparoscopic instruments were therefore removed under visualization of the laparoscope.  The CO2 pneumoperitoneum was released.  The umbilical trocar was closed along the fascia with a figure-of-eight suture of 0 Vicryl.  The skin was closed  with a subcuticular suture of 4- 0 Vicryl and Dermabond was placed over the incisions.  The sponge stick was removed from the vagina.  The Foley catheter was removed.  Cystoscopy was performed and the findings were as noted above. The bladder was drained of all cystoscopic fluid at the termination of the procedure.  The patient was awakened, extubated and escorted to the recovery room in stable condition.  There were no complications to the procedure.  All needle, instrument, and sponge counts were correct.     Lenard Galloway, M.D.     BES/MEDQ  D:  03/13/2015  T:  03/13/2015  Job:  [id number]

## 2015-03-13 NOTE — Anesthesia Postprocedure Evaluation (Signed)
  Anesthesia Post-op Note  Patient: Victoria Andrade  Procedure(s) Performed: Procedure(s): ROBOTIC ASSISTED INCISION OF  LEFT PELVIC MASS, LYSIS OF ADHESIONS  WITH PELVIC WASHINGS (Left) CYSTOSCOPY (N/A)  Patient Location: PACU  Anesthesia Type:General  Level of Consciousness: awake, alert  and oriented  Airway and Oxygen Therapy: Patient Spontanous Breathing  Post-op Pain: mild  Post-op Assessment: Post-op Vital signs reviewed, Patient's Cardiovascular Status Stable, Respiratory Function Stable, Patent Airway, No signs of Nausea or vomiting and Pain level controlled  Post-op Vital Signs: Reviewed and stable  Last Vitals:  Filed Vitals:   03/13/15 1100  BP: 154/66  Pulse: 70  Temp:   Resp: 14    Complications: No apparent anesthesia complications

## 2015-03-13 NOTE — Anesthesia Procedure Notes (Signed)
Procedure Name: Intubation Date/Time: 03/13/2015 7:34 AM Performed by: Quantay Zaremba, Sheron Nightingale Pre-anesthesia Checklist: Patient identified, Timeout performed, Emergency Drugs available, Suction available and Patient being monitored Patient Re-evaluated:Patient Re-evaluated prior to inductionOxygen Delivery Method: Circle system utilized Preoxygenation: Pre-oxygenation with 100% oxygen Intubation Type: IV induction Ventilation: Mask ventilation without difficulty Laryngoscope Size: Mac and 3 Grade View: Grade III Number of attempts: 1 Placement Confirmation: ETT inserted through vocal cords under direct vision,  positive ETCO2 and breath sounds checked- equal and bilateral Secured at: 21 cm Dental Injury: Teeth and Oropharynx as per pre-operative assessment

## 2015-03-13 NOTE — Transfer of Care (Signed)
Immediate Anesthesia Transfer of Care Note  Patient: CORNISHA ZETINO  Procedure(s) Performed: Procedure(s): ROBOTIC ASSISTED INCISION OF  LEFT PELVIC MASS, LYSIS OF ADHESIONS  WITH PELVIC WASHINGS (Left) CYSTOSCOPY (N/A)  Patient Location: PACU  Anesthesia Type:General  Level of Consciousness: awake, alert  and oriented  Airway & Oxygen Therapy: Patient Spontanous Breathing and Patient connected to nasal cannula oxygen  Post-op Assessment: Report given to RN and Post -op Vital signs reviewed and stable  Post vital signs: Reviewed and stable  Last Vitals:  Filed Vitals:   03/13/15 0607  BP: 147/99  Pulse: 80  Temp: 36.6 C  Resp: 20    Complications: No apparent anesthesia complications

## 2015-03-13 NOTE — Progress Notes (Signed)
Update to History and Physical  HgbA1C noted to 6.2.  FSBG 106  Patient examined.   OK to proceed with surgery.  Patient will see her PCP for follow up of blood sugar.

## 2015-03-13 NOTE — Discharge Instructions (Signed)
Diagnostic Laparoscopy Laparoscopy is a surgical procedure. It is used to diagnose and treat diseases inside the belly (abdomen). It is usually a brief, common, and relatively simple procedure. The laparoscopeis a thin, lighted, pencil-sized instrument. It is like a telescope. It is inserted into your abdomen through a small cut (incision). Your caregiver can look at the organs inside your body through this instrument. He or she can see if there is anything abnormal. Laparoscopy can be done either in a hospital or outpatient clinic. You may be given a mild sedative to help you relax before the procedure. Once in the operating room, you will be given a drug to make you sleep (general anesthesia). Laparoscopy usually lasts less than 1 hour. After the procedure, you will be monitored in a recovery area until you are stable and doing well. Once you are home, it will take 2 to 3 days to fully recover. RISKS AND COMPLICATIONS  Laparoscopy has relatively few risks. Your caregiver will discuss the risks with you before the procedure. Some problems that can occur include:  Infection.  Bleeding.  Damage to other organs.  Anesthetic side effects. PROCEDURE Once you receive anesthesia, your surgeon inflates the abdomen with a harmless gas (carbon dioxide). This makes the organs easier to see. The laparoscope is inserted into the abdomen through a small incision. This allows your surgeon to see into the abdomen. Other small instruments are also inserted into the abdomen through other small openings. Many surgeons attach a video camera to the laparoscope to enlarge the view. During a diagnostic laparoscopy, the surgeon may be looking for inflammation, infection, or cancer. Your surgeon may take tissue samples(biopsies). The samples are sent to a specialist in looking at cells and tissue samples (pathologist). The pathologist examines them under a microscope. Biopsies can help to diagnose or confirm a  disease. AFTER THE PROCEDURE   The gas is released from inside the abdomen.  The incisions are closed with stitches (sutures). Because these incisions are small (usually less than 1/2 inch), there is usually minimal discomfort after the procedure. There may be some mild discomfort in the throat. This is from the tube placed in the throat while you were sleeping. You may have some mild abdominal discomfort. There may also be discomfort from the instrument placement incisions in the abdomen.  The recovery time is shortened as long as there are no complications.  You will rest in a recovery room until stable and doing well. As long as there are no complications, you may be allowed to go home. FINDING OUT THE RESULTS OF YOUR TEST Not all test results are available during your visit. If your test results are not back during the visit, make an appointment with your caregiver to find out the results. Do not assume everything is normal if you have not heard from your caregiver or the medical facility. It is important for you to follow up on all of your test results. HOME CARE INSTRUCTIONS   Take all medicines as directed.  Only take over-the-counter or prescription medicines for pain, discomfort, or fever as directed by your caregiver.  Resume daily activities as directed.  Showers are preferred over baths.  You may resume sexual activities in 1 week or as directed.  Do not drive while taking narcotics. SEEK MEDICAL CARE IF:   There is increasing abdominal pain.  There is new pain in the shoulders (shoulder strap areas).  You feel lightheaded or faint.  You have the chills.  You or  your child has an oral temperature above 102 F (38.9 C).  There is pus-like (purulent) drainage from any of the wounds.  You are unable to pass gas or have a bowel movement.  You feel sick to your stomach (nauseous) or throw up (vomit). MAKE SURE YOU:   Understand these instructions.  Will watch  your condition.  Will get help right away if you are not doing well or get worse. Document Released: 03/23/2001 Document Revised: 04/11/2013 Document Reviewed: 12/15/2007 [facility] Patient Information 2015 [place], [place]. This information is not intended to replace advice given to you by your health care provider. Make sure you discuss any questions you have with your health care provider. DISCHARGE INSTRUCTIONS: Laparoscopy  The following instructions have been prepared to help you care for yourself upon your return home today.  Wound care:  Do not get the incision wet for the first 24 hours. The incision should be kept clean and dry.  The Band-Aids or dressings may be removed the day after surgery.  Should the incision become sore, red, and swollen after the first week, check with your doctor.  Personal hygiene:  Shower the day after your procedure.  Activity and limitations:  Do NOT drive or operate any equipment today.  Do NOT lift anything more than 15 pounds for 2-3 weeks after surgery.  Do NOT rest in bed all day.  Walking is encouraged. Walk each day, starting slowly with 5-minute walks 3 or 4 times a day. Slowly increase the length of your walks.  Walk up and down stairs slowly.  Do NOT do strenuous activities, such as golfing, playing tennis, bowling, running, biking, weight lifting, gardening, mowing, or vacuuming for 2-4 weeks. Ask your doctor when it is okay to start.  Diet: Eat a light meal as desired this evening. You may resume your usual diet tomorrow.  Return to work: This is dependent on the type of work you do. For the most part you can return to a desk job within a week of surgery. If you are more active at work, please discuss this with your doctor.  What to expect after your surgery: You may have a slight burning sensation when you urinate on the first day. You may have a very small amount of blood in the urine. Expect to have a small amount of vaginal  discharge/light bleeding for 1-2 weeks. It is not unusual to have abdominal soreness and bruising for up to 2 weeks. You may be tired and need more rest for about 1 week. You may experience shoulder pain for 24-72 hours. Lying flat in bed may relieve it.  Call your doctor for any of the following:  Develop a fever of 100.4 or greater  Inability to urinate 6 hours after discharge from hospital  Severe pain not relieved by pain medications  Persistent of heavy bleeding at incision site  Redness or swelling around incision site after a week  Increasing nausea or vomiting  Patient Signature________________________________________ Nurse Signature_________________________________________

## 2015-03-13 NOTE — H&P (Signed)
Victoria Masek E Amundson de Berton Lan, MD at 02/26/2015 11:39 AM       Status: Signed        Expand All Collapse All   GYNECOLOGY VISIT   HPI:    69 y.o. [id number] MarriedCaucasianF here for pelvic cyst noted on CT scan.   Status post robotic bilateral salpingo-oophorectomy for bilateral benign serous cystadenomas in 2010 - Dr. Joan Flores.   Had CT scan done for bilateral kidney stones and a pelvic cyst was noted at the time.   Dr. Dutch Gray referred for evaluation of this.   Pelvic ultrasound 02/15/15: Uterus absent.   Left adnexa with 4.7 cm thick walled cyst with single septation 1.8 mm.  Avascular. Right adnexa absent.   Mild free fluid.  CA125 was 15.   Had reaction to skin over suprapubic area following the ultrasound to check the mass.   Blistered.   Has IBS and has random abdominal and back pain.      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   Does better with liquid medications or medication she can cut into small pieces.   GYNECOLOGIC HISTORY: No LMP recorded. Patient has had a hysterectomy.  Status post TAH. Contraception:   Hysterectomy   Menopausal hormone therapy: Premarin 0.625 mg   Pap:  2010 WNL   History of abnormal Pap:  no MMG:  04/06/14 Breast Density Category C Bi-Rads 0; 04/17/14 Digital Diagnostic Left MMG/ Left Breast Ultrasound Bi-Rads 2: Benign            OB History      Gravida  Para  Term  Preterm  AB  TAB  SAB  Ectopic  Multiple  Living     2  1      1    1      1              Patient Active Problem List     Diagnosis  Date Noted   .  OTHER DISEASES OF VOCAL CORDS  01/01/2009   .  GERD  01/01/2009   .  COUGH  01/01/2009       Past Medical History   Diagnosis  Date   .  Hypertension     .  IBS (irritable bowel syndrome)     .  GERD (gastroesophageal reflux disease)     .  Kidney stones     .  Headache(784.0)         migraines   .  Cataract immature     .  PONV (postoperative nausea and  vomiting)     .  Anxiety         takes xanax   .  Melanoma         left eye       Past Surgical History   Procedure  Laterality  Date   .  Abdominal hysterectomy    1978       partial   .  Bilateral salpingoophorectomy    2010       with robot   .  Cholecystectomy    2005       lap   .  Tonsillectomy           as child   .  Tubal ligation    1976   .  Lithotripsy           20 years  ago       Current Outpatient Prescriptions   Medication  Sig  Dispense  Refill   .  ALPRAZolam (XANAX) 1 MG tablet  Take 1 mg by mouth at bedtime as needed for anxiety or sleep. anxiety       .  Cholecalciferol (VITAMIN D3) 5000 UNITS CAPS  Take 5,000 Units by mouth daily.        .  diphenhydrAMINE (BENADRYL) 25 MG tablet  Take 25 mg by mouth at bedtime as needed for allergies or sleep. sleep       .  estrogens, conjugated, (PREMARIN) 0.625 MG tablet  Take 0.625 mg by mouth 2 (two) times daily.        .  famotidine (PEPCID) 20 MG tablet  Take 20 mg by mouth as needed. For indigestion       .  furosemide (LASIX) 40 MG tablet  Take 40 mg by mouth as needed for fluid. For fluid       .  hydrochlorothiazide (HYDRODIURIL) 12.5 MG tablet  Take 12.5 mg by mouth daily at 12 noon. Takes around lunchtime       .  hydrochlorothiazide (HYDRODIURIL) 25 MG tablet  Take 25 mg by mouth daily with breakfast.        .  hydrocortisone-pramoxine (PRAMOSONE) 1-2.5 % LOTN  Apply 1 application topically daily as needed (rash).       .  loratadine (CLARITIN) 10 MG tablet  Take 10 mg by mouth daily.       .  metroNIDAZOLE (METROCREAM) 0.75 % cream  Apply 1 application topically daily as needed (rash).       .  montelukast (SINGULAIR) 10 MG tablet  Take 10 mg by mouth at bedtime.       .  naratriptan (AMERGE) 2.5 MG tablet  Take 2.5 mg by mouth as needed for migraine. Take one (1) tablet at onset of headache; if returns or does not resolve, may repeat after 4 hours; do not exceed five (5) mg in 24 hours.       .  Polyvinyl  Alcohol-Povidone (REFRESH OP)  Place 1 drop into both eyes daily as needed (dry eyes).           No current facility-administered medications for this visit.      ALLERGIES: Bystolic; Latex; Oxycodone; Prednisone; Adhesive; Aspirin; Codeine; Elavil; Other; Cephalosporins; Ciprofloxacin; and Penicillins  SOCIAL HISTORY Denies tobacco.   ETOH - yes.   Illicit drugs - no.    Family History   Problem  Relation  Age of Onset   .  Adopted: Yes   .  Diabetes  Maternal Grandmother     .  Hypertension  Mother     .  Hypertension  Maternal Grandmother     .  Hypertension  Maternal Grandfather     .  Stroke  Maternal Grandfather     .  Stroke  Maternal Uncle     .  Stroke  Maternal Uncle     .  Heart attack  Maternal Grandmother     .  Heart attack  Maternal Uncle     .  Heart attack  Maternal Uncle     .  Lung cancer  Maternal Uncle     .  Thyroid disease  Mother       ROS:  Pertinent items are noted in HPI.  PHYSICAL EXAMINATION:    Ht 5' 1.75" (1.568 m)     General appearance:  alert, cooperative and appears stated age Neck:  Thyroid nonenlarged, nontender.   Lungs: clear to auscultation bilaterally Heart: regular rate and rhythm Abdomen: scattered incisions, soft, non-tender; no masses,  no organomegaly No abnormal inguinal nodes palpated  Pelvic: External genitalia:  no lesions              Urethra:  normal appearing urethra with no masses, tenderness or lesions              Bartholins and Skenes: normal                  Vagina: normal appearing vagina with normal color and discharge, no lesions              Cervix: absent                    Bimanual Exam:  Uterus:   absent                                      Adnexa:  Fullness of left adnexa.                                      Rectovaginal:  Yes.                                                                Confirms                                      Anus:  normal sphincter tone, no lesions  ASSESSMENT  Status  post total abdominal hysterectomy.   Status post robotic bilateral salpingo-oophorectomy for benign bilateral serous cystadenomas. Left adnexal mass.  I suspect an ovarian remnant and a serous cystadenoma.    PLAN  Proceed with a robotic laparoscopic excision of a left ovarian remnant, collection of pelvic washings, and lysis of adhesions.   I have discussed benefits and risks including but not limited to bleeding, infection, damage to surrounding organs, reaction to anesthesia, pneumonia, neuropathy, hernia formation, DVT, PE, death, need for reoperation, and need for GYN oncology care if an unsuspected malignancy were found.  She wishes to proceed.   She will do a magnesium citrate bowel prep day prior to surgery and do clear liquids the day prior to surgery.   Surgical expectations and recovery discussed. Will have anesthesia consultation arranged at hospital.    An After Visit Summary was printed and given to the patient.  _25_____ minutes face to face time of which over 50% was spent in counseling.                Lavone Weisel E Amundson de Berton Lan, MD at 02/26/2015 11:39 AM       Status: Signed        Expand All Collapse All   GYNECOLOGY VISIT   HPI:    69 y.o. [id number] MarriedCaucasianF here for pelvic cyst noted on CT scan.   Status post robotic bilateral salpingo-oophorectomy for bilateral benign serous cystadenomas in  2010 - Dr. Joan Flores.   Had CT scan done for bilateral kidney stones and a pelvic cyst was noted at the time.   Dr. Dutch Gray referred for evaluation of this.   Pelvic ultrasound 02/15/15: Uterus absent.   Left adnexa with 4.7 cm thick walled cyst with single septation 1.8 mm.  Avascular. Right adnexa absent.   Mild free fluid.  CA125 was 15.   Had reaction to skin over suprapubic area following the ultrasound to check the mass.   Blistered.   Has IBS and has random abdominal and back pain.      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   Does better with liquid medications or medication she can cut into small pieces.   GYNECOLOGIC HISTORY: No LMP recorded. Patient has had a hysterectomy.  Status post TAH. Contraception:   Hysterectomy   Menopausal hormone therapy: Premarin 0.625 mg   Pap:  2010 WNL   History of abnormal Pap:  no MMG:  04/06/14 Breast Density Category C Bi-Rads 0; 04/17/14 Digital Diagnostic Left MMG/ Left Breast Ultrasound Bi-Rads 2: Benign            OB History      Gravida  Para  Term  Preterm  AB  TAB  SAB  Ectopic  Multiple  Living     2  1      1    1      1              Patient Active Problem List     Diagnosis  Date Noted   .  OTHER DISEASES OF VOCAL CORDS  01/01/2009   .  GERD  01/01/2009   .  COUGH  01/01/2009       Past Medical History   Diagnosis  Date   .  Hypertension     .  IBS (irritable bowel syndrome)     .  GERD (gastroesophageal reflux disease)     .  Kidney stones     .  Headache(784.0)         migraines   .  Cataract immature     .  PONV (postoperative nausea and vomiting)     .  Anxiety         takes xanax   .  Melanoma         left eye       Past Surgical History   Procedure  Laterality  Date   .  Abdominal hysterectomy    1978       partial   .  Bilateral salpingoophorectomy    2010       with robot   .  Cholecystectomy    2005       lap   .  Tonsillectomy           as child   .  Tubal ligation    1976   .  Lithotripsy           20 years ago       Current Outpatient Prescriptions   Medication  Sig  Dispense  Refill   .  ALPRAZolam (XANAX) 1 MG tablet  Take 1 mg by mouth at bedtime as needed for anxiety or sleep. anxiety       .  Cholecalciferol (VITAMIN D3) 5000 UNITS CAPS  Take 5,000 Units by mouth daily.        Marland Kitchen  diphenhydrAMINE (BENADRYL) 25 MG tablet  Take 25 mg by mouth at bedtime as needed for allergies or sleep. sleep       .  estrogens, conjugated, (PREMARIN) 0.625 MG tablet  Take 0.625 mg by mouth 2 (two) times  daily.        .  famotidine (PEPCID) 20 MG tablet  Take 20 mg by mouth as needed. For indigestion       .  furosemide (LASIX) 40 MG tablet  Take 40 mg by mouth as needed for fluid. For fluid       .  hydrochlorothiazide (HYDRODIURIL) 12.5 MG tablet  Take 12.5 mg by mouth daily at 12 noon. Takes around lunchtime       .  hydrochlorothiazide (HYDRODIURIL) 25 MG tablet  Take 25 mg by mouth daily with breakfast.        .  hydrocortisone-pramoxine (PRAMOSONE) 1-2.5 % LOTN  Apply 1 application topically daily as needed (rash).       .  loratadine (CLARITIN) 10 MG tablet  Take 10 mg by mouth daily.       .  metroNIDAZOLE (METROCREAM) 0.75 % cream  Apply 1 application topically daily as needed (rash).       .  montelukast (SINGULAIR) 10 MG tablet  Take 10 mg by mouth at bedtime.       .  naratriptan (AMERGE) 2.5 MG tablet  Take 2.5 mg by mouth as needed for migraine. Take one (1) tablet at onset of headache; if returns or does not resolve, may repeat after 4 hours; do not exceed five (5) mg in 24 hours.       .  Polyvinyl Alcohol-Povidone (REFRESH OP)  Place 1 drop into both eyes daily as needed (dry eyes).           No current facility-administered medications for this visit.      ALLERGIES: Bystolic; Latex; Oxycodone; Prednisone; Adhesive; Aspirin; Codeine; Elavil; Other; Cephalosporins; Ciprofloxacin; and Penicillins  SOCIAL HISTORY Denies tobacco.   ETOH - yes.   Illicit drugs - no.    Family History   Problem  Relation  Age of Onset   .  Adopted: Yes   .  Diabetes  Maternal Grandmother     .  Hypertension  Mother     .  Hypertension  Maternal Grandmother     .  Hypertension  Maternal Grandfather     .  Stroke  Maternal Grandfather     .  Stroke  Maternal Uncle     .  Stroke  Maternal Uncle     .  Heart attack  Maternal Grandmother     .  Heart attack  Maternal Uncle     .  Heart attack  Maternal Uncle     .  Lung cancer  Maternal Uncle     .  Thyroid disease  Mother       ROS:   Pertinent items are noted in HPI.  PHYSICAL EXAMINATION:    Ht 5' 1.75" (1.568 m)     General appearance: alert, cooperative and appears stated age Neck:  Thyroid nonenlarged, nontender.   Lungs: clear to auscultation bilaterally Heart: regular rate and rhythm Abdomen: scattered incisions, soft, non-tender; no masses,  no organomegaly No abnormal inguinal nodes palpated  Pelvic: External genitalia:  no lesions              Urethra:  normal appearing urethra with no masses, tenderness or lesions  Bartholins and Skenes: normal                  Vagina: normal appearing vagina with normal color and discharge, no lesions              Cervix: absent                    Bimanual Exam:  Uterus:   absent                                      Adnexa:  Fullness of left adnexa.                                      Rectovaginal:  Yes.                                                                Confirms                                      Anus:  normal sphincter tone, no lesions  ASSESSMENT  Status post total abdominal hysterectomy.   Status post robotic bilateral salpingo-oophorectomy for benign bilateral serous cystadenomas. Left adnexal mass.  I suspect an ovarian remnant and a serous cystadenoma.    PLAN  Proceed with a robotic laparoscopic excision of a left ovarian remnant, collection of pelvic washings, and lysis of adhesions.   I have discussed benefits and risks including but not limited to bleeding, infection, damage to surrounding organs, reaction to anesthesia, pneumonia, neuropathy, hernia formation, DVT, PE, death, need for reoperation, and need for GYN oncology care if an unsuspected malignancy were found.  She wishes to proceed.   She will do a magnesium citrate bowel prep day prior to surgery and do clear liquids the day prior to surgery.   Surgical expectations and recovery discussed. Will have anesthesia consultation arranged at hospital.    An After Visit  Summary was printed and given to the patient.  _25_____ minutes face to face time of which over 50% was spent in counseling.

## 2015-03-13 NOTE — Brief Op Note (Signed)
03/13/2015  9:26 AM  PATIENT:  Victoria Andrade  69 y.o. female  PRE-OPERATIVE DIAGNOSIS:  left adnexal mass  POST-OPERATIVE DIAGNOSIS:  left adnexal mass  PROCEDURE:  Procedure(s): ROBOTIC ASSISTED INCISION OF  LEFT PELVIC MASS, LYSIS OF ADHESIONS  WITH PELVIC WASHINGS (Left) CYSTOSCOPY (N/A)  SURGEON:  Surgeon(s) and Role:    * Brook E Yisroel Ramming, MD - Primary    * Megan Salon, MD - Assisting  PHYSICIAN ASSISTANT: NA  ASSISTANTS: Lyman Speller, MD   ANESTHESIA:   local and general  EBL:  Total I/O In: 1000 [I.V.:1000] Out: 110 [Urine:100; Blood:10]  BLOOD ADMINISTERED:none  DRAINS: none   LOCAL MEDICATIONS USED:  MARCAINE    and Amount:  10 ml  SPECIMEN:  Source of Specimen:  left pelvic mass, pelvic washings.  DISPOSITION OF SPECIMEN:  PATHOLOGY  COUNTS:  YES  TOURNIQUET:  * No tourniquets in log *  DICTATION: .Other Dictation: Dictation Number    PLAN OF CARE: Discharge to home after PACU  PATIENT DISPOSITION:  PACU - hemodynamically stable.   Delay start of Pharmacological VTE agent (>24hrs) due to surgical blood loss or risk of bleeding: not applicable

## 2015-03-14 ENCOUNTER — Encounter (HOSPITAL_COMMUNITY): Payer: Self-pay | Admitting: Obstetrics and Gynecology

## 2015-03-14 MED FILL — Heparin Sodium (Porcine) Inj 5000 Unit/ML: INTRAMUSCULAR | Qty: 1 | Status: AC

## 2015-03-19 ENCOUNTER — Telehealth: Payer: Self-pay | Admitting: Emergency Medicine

## 2015-03-19 ENCOUNTER — Other Ambulatory Visit: Payer: Self-pay

## 2015-03-19 DIAGNOSIS — Z1231 Encounter for screening mammogram for malignant neoplasm of breast: Secondary | ICD-10-CM

## 2015-03-19 NOTE — Telephone Encounter (Signed)
-----   Message from Nunzio Cobbs, MD sent at 03/14/2015  1:30 PM EDT ----- Please inform patient of surgical pathology report showing benign cystadenofibroma. This is definitely ovarian tissue and is slightly different from what she had previously. This does represent an ovarian remnant.

## 2015-03-19 NOTE — Telephone Encounter (Signed)
Spoke with patient and message from Dr. Quincy Simmonds given.  She verbalizes understanding.  She will call back with any concerns and is reminded of post op appointment scheduled for 03/28/15. Routing to provider for final review. Patient agreeable to disposition. Will close encounter

## 2015-03-28 ENCOUNTER — Ambulatory Visit (INDEPENDENT_AMBULATORY_CARE_PROVIDER_SITE_OTHER): Payer: Medicare PPO | Admitting: Obstetrics and Gynecology

## 2015-03-28 ENCOUNTER — Encounter: Payer: Self-pay | Admitting: Obstetrics and Gynecology

## 2015-03-28 VITALS — BP 130/76 | HR 70 | Ht 61.75 in | Wt 182.4 lb

## 2015-03-28 DIAGNOSIS — Z9889 Other specified postprocedural states: Secondary | ICD-10-CM

## 2015-03-28 NOTE — Progress Notes (Signed)
Patient ID: Victoria Andrade, female   DOB: 1946/12/21, 69 y.o.   MRN: [id number] GYNECOLOGY  VISIT   HPI: 69 y.o.   Married  Caucasian  female   G2P0011 with No LMP recorded. Patient has had a hysterectomy.   here for 2 weeks post op.    Status post robotic laparoscopic excision of left ovarian remnant.  This was noted during work up and diagnosis for her kidney stones.   Has some right midabdominal discomfort and had drainage from left sided incision.  States her hands are red today and her face was breaking out.  No shortness of breath or tongue swelling.  No new exposures.   Patient received hydrocodone with tylenol in a liquid form.  Took this post op. Had feet stinging and irritation.  No shortness of breath or tongue swelling or lip swelling.   Has multiple alltegies.  Has not seen an allergist for allergy testing.   Has HgbA1C of 6.2, and patient will see her PCP regarding this.   GYNECOLOGIC HISTORY: No LMP recorded. Patient has had a hysterectomy. Contraception:  Hysterectomy  Menopausal hormone therapy: Premarin 0.625mg         OB History    Gravida Para Term Preterm AB TAB SAB Ectopic Multiple Living   2 1   1  1   1          Patient Active Problem List   Diagnosis Date Noted  . OTHER DISEASES OF VOCAL CORDS 01/01/2009  . GERD 01/01/2009  . COUGH 01/01/2009    Past Medical History  Diagnosis Date  . IBS (irritable bowel syndrome)   . GERD (gastroesophageal reflux disease)   . Kidney stones   . Headache(784.0)     migraines  . Cataract immature   . PONV (postoperative nausea and vomiting)   . Anxiety     takes xanax  . Melanoma     left eye  . Hypertension     no meds    Past Surgical History  Procedure Laterality Date  . Abdominal hysterectomy  1978    partial  . Bilateral salpingoophorectomy  2010    with robot  . Cholecystectomy  2005    lap  . Tonsillectomy      as child  . Tubal ligation  1976  . Lithotripsy      20 years ago  .  Robotic assisted salpingo oopherectomy Left 03/13/2015    Procedure: ROBOTIC ASSISTED INCISION OF  LEFT PELVIC MASS, LYSIS OF ADHESIONS  WITH PELVIC WASHINGS;  Surgeon: Nunzio Cobbs, MD;  Location: [facility];  Service: Gynecology;  Laterality: Left;  . Cystoscopy N/A 03/13/2015    Procedure: CYSTOSCOPY;  Surgeon: Nunzio Cobbs, MD;  Location: [facility];  Service: Gynecology;  Laterality: N/A;    Current Outpatient Prescriptions  Medication Sig Dispense Refill  . ALPRAZolam (XANAX) 1 MG tablet Take 1 mg by mouth at bedtime as needed for anxiety or sleep. anxiety    . Cholecalciferol (VITAMIN D3) 5000 UNITS CAPS Take 5,000 Units by mouth daily.     . diphenhydrAMINE (BENADRYL) 25 MG tablet Take 25 mg by mouth at bedtime as needed for allergies or sleep. sleep    . estrogens, conjugated, (PREMARIN) 0.625 MG tablet Take 0.625 mg by mouth 2 (two) times daily.     . famotidine (PEPCID) 20 MG tablet Take 20 mg by mouth as needed. For indigestion    . furosemide (LASIX) 40 MG tablet Take  40 mg by mouth as needed for fluid. For fluid    . hydrochlorothiazide (HYDRODIURIL) 25 MG tablet Take 25 mg by mouth daily with breakfast.     . hydrochlorothiazide (MICROZIDE) 12.5 MG capsule     . HYDROcodone-acetaminophen (HYCET) 7.5-325 mg/15 ml solution Take 15 mLs by mouth 4 (four) times daily as needed for moderate pain. 120 mL 0  . hydrocortisone-pramoxine (PRAMOSONE) 1-2.5 % LOTN Apply 1 application topically daily as needed (rash).    . loratadine (CLARITIN) 10 MG tablet Take 10 mg by mouth daily.    . metroNIDAZOLE (METROCREAM) 0.75 % cream Apply 1 application topically daily as needed (rash).    . montelukast (SINGULAIR) 10 MG tablet Take 10 mg by mouth at bedtime.    . naratriptan (AMERGE) 2.5 MG tablet Take 2.5 mg by mouth as needed for migraine. Take one (1) tablet at onset of headache; if returns or does not resolve, may repeat after 4 hours; do not exceed five (5) mg in 24 hours.    .  Polyvinyl Alcohol-Povidone (REFRESH OP) Place 1 drop into both eyes daily as needed (dry eyes).     Marland Kitchen ipratropium (ATROVENT) 0.06 % nasal spray      No current facility-administered medications for this visit.     ALLERGIES: Bystolic; Latex; Oxycodone; Prednisone; Adhesive; Aspirin; Codeine; Elavil; Other; Cephalosporins; Ciprofloxacin; and Penicillins  Family History  Problem Relation Age of Onset  . Adopted: Yes  . Diabetes Maternal Grandmother   . Hypertension Maternal Grandmother   . Heart attack Maternal Grandmother   . Hypertension Mother   . Thyroid disease Mother   . Hypertension Maternal Grandfather   . Stroke Maternal Grandfather   . Stroke Maternal Uncle   . Stroke Maternal Uncle   . Heart attack Maternal Uncle   . Heart attack Maternal Uncle   . Lung cancer Maternal Uncle     History   Social History  . Marital Status: Married    Spouse Name: N/A  . Number of Children: N/A  . Years of Education: N/A   Occupational History  . Not on file.   Social History Main Topics  . Smoking status: Never Smoker   . Smokeless tobacco: Not on file  . Alcohol Use: 0.0 oz/week    0 Standard drinks or equivalent per week     Comment: occassionally  . Drug Use: No  . Sexual Activity:    Partners: Male   Other Topics Concern  . Not on file   Social History Narrative    ROS:  Pertinent items are noted in HPI.  PHYSICAL EXAMINATION:    BP 130/76 mmHg  Pulse 70  Ht 5' 1.75" (1.568 m)  Wt 182 lb 6.4 oz (82.736 kg)  BMI 33.65 kg/m2     General appearance: alert, cooperative and appears stated age Lungs: clear to auscultation bilaterally Heart: regular rate and rhythm Abdomen: incisions intact and without tenderness, drainage, or erythema.  Small suture tail removed from left sided incision.  Abdomen is soft, non-tender; no masses,  no organomegaly           Bimanual Pelvic Exam:  Uterus:  absent                                           Adnexa:   no masses,  nontender.  ASSESSMENT  Status post robotic excision of left ovarian remnant and lysis of adhesions.  Cystadenofibroma on final pathology.  Hydrocodone allergy? Elevated HgbA1C.  PLAN  Final pathology report reviewed with patient. Discussed cecum scar tissue to anterior abdominal wall and that I could not remove this due to the density of the adhesion.  I reviewed signs of bowel obstruction so that if the patient experiences pain, fever, nausea, and vomiting in the future.  I recommend allergy testing as patient has multiple allergies and side effects to medications.  She will see her PCP regarding her elevated blood sugar.   Return for annual exam and prn.      An After Visit Summary was printed and given to the patient.  __25____ minutes face to face time of which over 50% was spent in counseling.

## 2015-04-09 ENCOUNTER — Other Ambulatory Visit (HOSPITAL_COMMUNITY): Payer: Self-pay | Admitting: Family Medicine

## 2015-04-10 ENCOUNTER — Ambulatory Visit
Admission: RE | Admit: 2015-04-10 | Discharge: 2015-04-10 | Disposition: A | Discharge status: Home / Self Care | Payer: Medicare PPO | Source: Ambulatory Visit

## 2015-04-10 DIAGNOSIS — Z1231 Encounter for screening mammogram for malignant neoplasm of breast: Secondary | ICD-10-CM

## 2015-04-17 ENCOUNTER — Other Ambulatory Visit (HOSPITAL_COMMUNITY): Payer: Self-pay | Admitting: Family Medicine

## 2015-04-17 DIAGNOSIS — M810 Age-related osteoporosis without current pathological fracture: Secondary | ICD-10-CM

## 2015-04-20 ENCOUNTER — Ambulatory Visit (HOSPITAL_COMMUNITY)
Admission: RE | Admit: 2015-04-20 | Discharge: 2015-04-20 | Disposition: A | Discharge status: Home / Self Care | Payer: Medicare PPO | Source: Ambulatory Visit | Attending: Family Medicine | Admitting: Family Medicine

## 2015-04-20 DIAGNOSIS — M810 Age-related osteoporosis without current pathological fracture: Secondary | ICD-10-CM

## 2015-06-04 DIAGNOSIS — H524 Presbyopia: Secondary | ICD-10-CM | POA: Diagnosis not present

## 2015-06-04 DIAGNOSIS — H5213 Myopia, bilateral: Secondary | ICD-10-CM | POA: Diagnosis not present

## 2015-06-04 DIAGNOSIS — H52223 Regular astigmatism, bilateral: Secondary | ICD-10-CM | POA: Diagnosis not present

## 2015-06-25 ENCOUNTER — Other Ambulatory Visit: Payer: Self-pay

## 2015-06-29 DIAGNOSIS — R109 Unspecified abdominal pain: Secondary | ICD-10-CM | POA: Diagnosis not present

## 2015-06-29 DIAGNOSIS — N200 Calculus of kidney: Secondary | ICD-10-CM | POA: Diagnosis not present

## 2015-06-29 DIAGNOSIS — N2889 Other specified disorders of kidney and ureter: Secondary | ICD-10-CM | POA: Diagnosis not present

## 2015-07-03 DIAGNOSIS — Z6831 Body mass index (BMI) 31.0-31.9, adult: Secondary | ICD-10-CM | POA: Diagnosis not present

## 2015-07-03 DIAGNOSIS — R7309 Other abnormal glucose: Secondary | ICD-10-CM | POA: Diagnosis not present

## 2015-07-03 DIAGNOSIS — Z1389 Encounter for screening for other disorder: Secondary | ICD-10-CM | POA: Diagnosis not present

## 2015-07-03 DIAGNOSIS — E6609 Other obesity due to excess calories: Secondary | ICD-10-CM | POA: Diagnosis not present

## 2015-07-03 DIAGNOSIS — M549 Dorsalgia, unspecified: Secondary | ICD-10-CM | POA: Diagnosis not present

## 2015-07-03 DIAGNOSIS — N200 Calculus of kidney: Secondary | ICD-10-CM | POA: Diagnosis not present

## 2015-07-03 DIAGNOSIS — F419 Anxiety disorder, unspecified: Secondary | ICD-10-CM | POA: Diagnosis not present

## 2015-07-04 DIAGNOSIS — N200 Calculus of kidney: Secondary | ICD-10-CM | POA: Diagnosis not present

## 2015-07-04 DIAGNOSIS — N2889 Other specified disorders of kidney and ureter: Secondary | ICD-10-CM | POA: Diagnosis not present

## 2015-07-10 DIAGNOSIS — N200 Calculus of kidney: Secondary | ICD-10-CM | POA: Diagnosis not present

## 2015-07-19 DIAGNOSIS — H43811 Vitreous degeneration, right eye: Secondary | ICD-10-CM | POA: Diagnosis not present

## 2015-08-24 DIAGNOSIS — L570 Actinic keratosis: Secondary | ICD-10-CM | POA: Diagnosis not present

## 2015-08-24 DIAGNOSIS — L718 Other rosacea: Secondary | ICD-10-CM | POA: Diagnosis not present

## 2015-08-24 DIAGNOSIS — L821 Other seborrheic keratosis: Secondary | ICD-10-CM | POA: Diagnosis not present

## 2015-10-04 ENCOUNTER — Ambulatory Visit (INDEPENDENT_AMBULATORY_CARE_PROVIDER_SITE_OTHER): Payer: Medicare PPO | Admitting: Obstetrics and Gynecology

## 2015-10-04 ENCOUNTER — Encounter: Payer: Self-pay | Admitting: Obstetrics and Gynecology

## 2015-10-04 VITALS — BP 132/82 | HR 76 | Resp 16 | Ht 61.5 in | Wt 175.8 lb

## 2015-10-04 DIAGNOSIS — Z79890 Hormone replacement therapy (postmenopausal): Secondary | ICD-10-CM | POA: Diagnosis not present

## 2015-10-04 DIAGNOSIS — Z01419 Encounter for gynecological examination (general) (routine) without abnormal findings: Secondary | ICD-10-CM | POA: Diagnosis not present

## 2015-10-04 MED ORDER — ESTROGENS CONJUGATED 0.45 MG PO TABS: 0.4500 mg | ORAL_TABLET | Freq: Two times a day (BID) | ORAL | Status: DC

## 2015-10-04 NOTE — Progress Notes (Signed)
Patient ID: Victoria Andrade, female   DOB: [birth year], 69 y.o.   MRN: [id number] 69 y.o. G72P0011 Married Caucasian female here for annual exam.    Status post TAH.  Status post robotic laparoscopic BSO.  On Premarin 0.625 mg twice a day.  Has weaned down over time.  Takes it for mood swings.  Unable to use the estrogen patch or cream.   Saw PCP regarding elevated HgbA1C.  Doing dietary control.  Doing weight loss.   Has some right sided flank and back pain.  Has constipation.  Has IBS.   Husband is having treatment for prostate cancer with Dr. Dutch Gray.   PCP:   Sharilyn Sites, MD  No LMP recorded. Patient has had a hysterectomy.          Sexually active: Yes.   female partner The current method of family planning is status post hysterectomy.    Exercising: Yes.    walking 3x/week. Smoker:  no  Health Maintenance: Pap:  2010 Negative History of abnormal Pap:  no MMG:  04-10-15 Density Cat.C/Neg/BiRads 1:[facility]. Colonoscopy:  2014 normal with Dr. Laurence Spates. Next due 2019. BMD:   04-20-15  Result  Osteopenia:[facility].  Took Fosamax in the past and had reflux with this.  TDaP:  Up to date with PCP Screening Labs:  Hb today: PCP, Urine today: PCP   reports that she has never smoked. She does not have any smokeless tobacco history on file. She reports that she drinks about 0.6 oz of alcohol per week. She reports that she does not use illicit drugs.  Past Medical History  Diagnosis Date  . IBS (irritable bowel syndrome)   . GERD (gastroesophageal reflux disease)   . Kidney stones   . Headache(784.0)     migraines  . Cataract immature   . PONV (postoperative nausea and vomiting)   . Anxiety     takes xanax  . Melanoma ([place])     left eye  . Hypertension   . Elevated hemoglobin A1c March 2016    level 6.2  . Osteopenia 2016    Past Surgical History  Procedure Laterality Date  . Abdominal hysterectomy  1978    partial  . Bilateral salpingoophorectomy  2010     with robot  . Cholecystectomy  2005    lap  . Tonsillectomy      as child  . Tubal ligation  1976  . Lithotripsy      20 years ago  . Robotic assisted salpingo oopherectomy Left 03/13/2015    Procedure: ROBOTIC ASSISTED INCISION OF  LEFT PELVIC MASS, LYSIS OF ADHESIONS  WITH PELVIC WASHINGS;  Surgeon: Nunzio Cobbs, MD;  Location: [facility];  Service: Gynecology;  Laterality: Left;  . Cystoscopy N/A 03/13/2015    Procedure: CYSTOSCOPY;  Surgeon: Nunzio Cobbs, MD;  Location: [facility];  Service: Gynecology;  Laterality: N/A;    Current Outpatient Prescriptions  Medication Sig Dispense Refill  . ALPRAZolam (XANAX) 1 MG tablet Take 1 mg by mouth at bedtime as needed for anxiety or sleep. anxiety    . Cholecalciferol (VITAMIN D3) 5000 UNITS CAPS Take 5,000 Units by mouth daily.     . diphenhydrAMINE (BENADRYL) 25 MG tablet Take 25 mg by mouth at bedtime as needed for allergies or sleep. sleep    . estrogens, conjugated, (PREMARIN) 0.625 MG tablet Take 0.625 mg by mouth 2 (two) times daily.     . famotidine (PEPCID)  20 MG tablet Take 20 mg by mouth as needed. For indigestion    . furosemide (LASIX) 40 MG tablet Take 40 mg by mouth as needed for fluid. For fluid    . hydrochlorothiazide (HYDRODIURIL) 25 MG tablet Take 25 mg by mouth. Takes 1 tablet at breakfast and 1 tablet at lunch    . hydrocortisone-pramoxine (PRAMOSONE) 1-2.5 % LOTN Apply 1 application topically daily as needed (rash).    Marland Kitchen ipratropium (ATROVENT) 0.06 % nasal spray     . loratadine (CLARITIN) 10 MG tablet Take 10 mg by mouth daily.    . metroNIDAZOLE (METROCREAM) 0.75 % cream Apply 1 application topically daily as needed (rash).    . montelukast (SINGULAIR) 10 MG tablet Take 10 mg by mouth at bedtime.    . naratriptan (AMERGE) 2.5 MG tablet Take 2.5 mg by mouth as needed for migraine. Take one (1) tablet at onset of headache; if returns or does not resolve, may repeat after 4 hours; do not exceed five (5)  mg in 24 hours.     No current facility-administered medications for this visit.    Family History  Problem Relation Age of Onset  . Adopted: Yes  . Diabetes Maternal Grandmother   . Hypertension Maternal Grandmother   . Heart attack Maternal Grandmother   . Hypertension Mother   . Thyroid disease Mother   . Hypertension Maternal Grandfather   . Stroke Maternal Grandfather   . Stroke Maternal Uncle   . Stroke Maternal Uncle   . Heart attack Maternal Uncle   . Heart attack Maternal Uncle   . Lung cancer Maternal Uncle     ROS:  Pertinent items are noted in HPI.  Otherwise, a comprehensive ROS was negative.  Exam:   BP 132/82 mmHg  Pulse 76  Resp 16  Ht 5' 1.5" (1.562 m)  Wt 175 lb 12.8 oz (79.742 kg)  BMI 32.68 kg/m2    General appearance: alert, cooperative and appears stated age Head: Normocephalic, without obvious abnormality, atraumatic Neck: no adenopathy, supple, symmetrical, trachea midline and thyroid normal to inspection and palpation Lungs: clear to auscultation bilaterally Breasts: normal appearance, no masses or tenderness, Inspection negative, No nipple retraction or dimpling, No nipple discharge or bleeding, No axillary or supraclavicular adenopathy Heart: regular rate and rhythm Abdomen: scattered laparoscopic incisions and Pfannenstiel incision, soft, non-tender; bowel sounds normal; no masses,  no organomegaly Extremities: extremities normal, atraumatic, no cyanosis or edema Skin: Skin color, texture, turgor normal. No rashes or lesions Lymph nodes: Cervical, supraclavicular, and axillary nodes normal. No abnormal inguinal nodes palpated Neurologic: Grossly normal  Pelvic: External genitalia:  no lesions              Urethra:  normal appearing urethra with no masses, tenderness or lesions              Bartholins and Skenes: normal                 Vagina: normal appearing vagina with normal color and discharge, no lesions              Cervix: absent               Pap taken: No. Bimanual Exam:  Uterus:  uterus absent              Adnexa: normal adnexa and no mass, fullness, tenderness              Rectovaginal: Yes.  .  Confirms.  Anus:  normal sphincter tone, no lesions  Chaperone was present for exam.  Assessment:   Well woman visit with normal exam. Status post TAH.  Status post laparoscopic BSO with robotic approach.  Osteopenia.  Estrogen therapy patient.  Right flank and back pain.  No acute abdomen.  Hx constipation.  Has adhesions around cecum.  Anxiety.   Plan: Yearly mammogram recommended after age 21.  Recommended self breast exam.  Pap and HR HPV as above. Discussed Calcium, Vitamin D, regular exercise program including cardiovascular and weight bearing exercise. Bone density in 2 years with PCP.  Labs performed.  No..   See orders. Refills given on medications.  Yes.  .  See orders.  Premarin 0.45 mg bid.  See orders. Patient will see her PCP in one month. She will discuss medication for anxiety prevention at that time if needed.  Discussed signs and symptoms of bowel obstruction.   Follow up annually and prn.   Additional counseling given regarding ERT in menopause.  Extensive discussion of risks and benefits.  Women's Health Initiative discussed.   I have recommended weaning down on medication to Premarin 0.45 mg po bid.  I have told patient that my goal is to wean her off the ERT.  IF she has increased anxiety, the goal is not to increase the estrogen but to treat the anxiety with a proper medication.   An additional 15 minutes were spent of which over 50% was spent in counseling.   After visit summary provided.

## 2015-10-30 DIAGNOSIS — E6609 Other obesity due to excess calories: Secondary | ICD-10-CM | POA: Diagnosis not present

## 2015-10-30 DIAGNOSIS — N200 Calculus of kidney: Secondary | ICD-10-CM | POA: Diagnosis not present

## 2015-10-30 DIAGNOSIS — Z6832 Body mass index (BMI) 32.0-32.9, adult: Secondary | ICD-10-CM | POA: Diagnosis not present

## 2015-10-30 DIAGNOSIS — E782 Mixed hyperlipidemia: Secondary | ICD-10-CM | POA: Diagnosis not present

## 2015-10-30 DIAGNOSIS — E278 Other specified disorders of adrenal gland: Secondary | ICD-10-CM | POA: Diagnosis not present

## 2015-10-30 DIAGNOSIS — J984 Other disorders of lung: Secondary | ICD-10-CM | POA: Diagnosis not present

## 2015-10-30 DIAGNOSIS — Z0000 Encounter for general adult medical examination without abnormal findings: Secondary | ICD-10-CM | POA: Diagnosis not present

## 2015-10-30 DIAGNOSIS — Z1389 Encounter for screening for other disorder: Secondary | ICD-10-CM | POA: Diagnosis not present

## 2015-11-27 DIAGNOSIS — N200 Calculus of kidney: Secondary | ICD-10-CM | POA: Diagnosis not present

## 2015-11-29 ENCOUNTER — Other Ambulatory Visit: Payer: Self-pay | Admitting: Urology

## 2015-11-30 DIAGNOSIS — R918 Other nonspecific abnormal finding of lung field: Secondary | ICD-10-CM | POA: Diagnosis not present

## 2015-11-30 DIAGNOSIS — E279 Disorder of adrenal gland, unspecified: Secondary | ICD-10-CM | POA: Diagnosis not present

## 2015-11-30 DIAGNOSIS — N200 Calculus of kidney: Secondary | ICD-10-CM | POA: Diagnosis not present

## 2015-12-04 ENCOUNTER — Other Ambulatory Visit: Payer: Self-pay | Admitting: Urology

## 2015-12-06 ENCOUNTER — Encounter (HOSPITAL_COMMUNITY): Payer: Self-pay | Admitting: *Deleted

## 2015-12-06 NOTE — H&P (Signed)
History of Present Illness Victoria Andrade is a 69 year old previously followed by Dr. Tresa Endo with the following urologic history:    1) Urolithiasis: She has a history of calcium oxalate urolithiasis. She has a history of low urine volume and hyperoxaluria.  Current treatment: Fluid hydration, calcium citrate, low oxalate diet, high citrate diet, HCTZ 25 mg/12.5 mg    1995: ESWL  Apr 2013: ESWL 8 mm left ureteral stone  June 2013: ESWL of multiple left renal calculi  Aug 2013: 24 hr urine - low urine volume, hyperoxaluria  Feb 2014: 24 hr urine - Stone risk significantly reduced  Apr 2015: 24 hr urine - Calcium and oxalate increased  Dec 2015: 24 hr urine - Hypercalciuria and high urine sodium - increased HCTZ to 25 mg q am and 12.5 mg q pm, reduce dietary sodium  Jul 2016: 24 hr urine - Hyeroxaluria and hypocitraturia - not compliant with calcium treatment -- plan to increase calcium supplement compliance    Interval history:    She follows up today for further evaluation of her known bilateral renal calculi. She has been unable to tolerate medical therapy for her hyperoxaluria and hypocitraturia. She could not tolerate calcium citrate due to GI upset and bloating. She was unable to tolerate other forms of calcium due to significant constipation. She also has not been able tolerate potassium citrate due to GI upset. She has been off these medications over the past few months. She does continue to take hydrochlorothiazide for hypercalciuria. She denied any hematuria or worsening flank pain. She does continue to have chronic periodic back pain which has not been urologic in nature.     Past Medical History Problems  1. History of Anxiety (F41.9) 2. History of esophageal reflux (Z87.19) 3. History of hypertension (Z86.79) 4. History of Irritable bowel syndrome with diarrhea (K58.0)  Surgical History Problems  1. History of Gallbladder Surgery 2. History of  Hysterectomy 3. History of Renal Lithotripsy 4. History of Renal Lithotripsy 5. History of Tonsillectomy 6. History of Tubal Ligation  Current Meds 1. Amerge 2.5 MG Oral Tablet;  Therapy: (Recorded:31Mar2015) to Recorded 2. Benadryl TABS;  Therapy: (Recorded:17Apr2013) to Recorded 3. Claritin TABS;  Therapy: (Recorded:16Jul2013) to Recorded 4. Clobetasol Propionate 0.05 % External Foam;  Therapy: 21Oct2015 to Recorded 5. Desoximetasone 0.05 % External Ointment;  Therapy: [phone] to Recorded 6. Hydrochlorothiazide 25 MG Oral Tablet; Take 1 tablet twice daily;  Therapy: [id number] to (Renew:06May2017)  Requested for: [id number]; Last  Rx:11May2016 Ordered 7. Ipratropium Bromide 0.06 % Nasal Solution;  Therapy: [id number] to Recorded 8. Montelukast Sodium 10 MG Oral Tablet;  Therapy: [phone] to Recorded 9. Premarin 0.45 MG Oral Tablet;  Therapy: 06Oct2016 to Recorded 10. Vitamin D-3 5000 UNIT TABS;   Therapy: (Recorded:14Jan2014) to Recorded 11. Xanax 1 MG Oral Tablet;   Therapy: (Recorded:17Apr2013) to Recorded  Allergies Medication  1. Keflex TABS 2. PredniSONE TABS 3. Ciprofloxacin HCl TABS 4. Penicillins 5. Sulfa Drugs Non-Medication  6. Adhesive Tape 7. Latex  Family History Problems  1. Family history of Acute Myocardial Infarction : Mother 2. Family history of Acute Myocardial Infarction   grandparents 3. Family history of Chronic Obstructive Pulmonary Disease : Mother 4. Family history of Diabetes Mellitus 5. Family history of Ischemic Stroke : Mother  Social History Problems  1. Denied: Alcohol Use (History) 2. Family history of Death In The Family Mother   age 82 emphysema COPD 70. Marital History - Currently Married 4. Never A Smoker 5. Occupation:  asst clerk of court 6. Denied: Tobacco Use  Vitals Vital Signs [Data Includes: Last 1 Day]  Recorded: [id number] 03:34PM  Weight: 176 lb  BMI Calculated: 32.19 BSA Calculated: 1.81 Blood  Pressure: 143 / 80 Heart Rate: 80  Physical Exam Constitutional: Well nourished and well developed . No acute distress.  ENT:. The ears and nose are normal in appearance.  Neck: The appearance of the neck is normal and no neck mass is present.  Pulmonary: No respiratory distress and normal respiratory rhythm and effort.  Cardiovascular: Heart rate and rhythm are normal . No peripheral edema.  Abdomen: The abdomen is soft and nontender. No CVA tenderness.    Results/Data Urine [Data Includes: Last 1 Day]   [id number]  COLOR YELLOW   APPEARANCE CLEAR   SPECIFIC GRAVITY 1.010   pH 7.0   GLUCOSE NEGATIVE   BILIRUBIN NEGATIVE   KETONE NEGATIVE   BLOOD NEGATIVE   PROTEIN NEGATIVE   NITRITE NEGATIVE   LEUKOCYTE ESTERASE TRACE   SQUAMOUS EPITHELIAL/HPF 0-5 HPF  WBC NONE SEEN WBC/HPF  RBC 3-10 RBC/HPF  BACTERIA NONE SEEN HPF  CRYSTALS NONE SEEN HPF  CASTS NONE SEEN LPF  Yeast NONE SEEN HPF   I independently reviewed her KUB x-ray and this was compared to her CT scan in the summer. She does have what appears to be a growing calcification in the central portion of the right renal collecting system which appears to measure approximately 11 mm increased in size from approximately 9 mm. She continues to have calcifications over the lower and upper pole of the left kidney measuring 9 mm and 7 mm respectively.   Assessment Assessed  1. Nephrolithiasis (N20.0)  Plan Health Maintenance  1. UA With REFLEX; [Do Not Release]; Status:Complete;   [id number] 03:19PM Nephrolithiasis  2. Follow-up Office  Follow-up  Status: Complete  Done: [id number]  Discussion/Summary 1. Bilateral renal calculi: She has been unable to tolerate recommended medical therapy for stone prevention. She does have continued growth of her known stones and we did discuss options of proceeding with proactive surgical intervention versus continued monitoring. Considering that she is asymptomatic, she understands that  she has either option. She does wish to be proactive following our discussion and would like to proceed with shockwave lithotripsy of her right renal calculus. We did review the potential pros and cons of this approach as well as alternatives including ureteroscopy and percutaneous nephrolithotomy. She wishes to be scheduled for ESWL of her right renal calculus prior to the end of the year. She understands her stone is not terribly dense although does measure a little over 900 Hounsfield units on her CT scan. Hopefully this will be well visualized on KUB at the time of her procedure. She understands the failure to well visualize her stone might require an alternative approach.    We will plan to have her follow up after her shockwave procedure and will then provide ongoing surveillance of her left renal calculi as well. She may wish to treat these next year.      Signatures Electronically signed by : Raynelle Bring, M.D.; Nov 27 2015  5:47PM EST  Add: Chart and imaging Reviewed.  I did decide to treat with Perioperative gentamicin per pharmacy consult. Plan RIGHT eswl for 11 mm right stone.

## 2015-12-09 MED ORDER — GENTAMICIN SULFATE 40 MG/ML IJ SOLN
5.0000 mg/kg | Freq: Once | INTRAVENOUS | Status: AC
  Administered 2015-12-10: 400 mg via INTRAVENOUS
  Filled 2015-12-09: qty 10

## 2015-12-10 ENCOUNTER — Encounter (HOSPITAL_COMMUNITY): Payer: Self-pay | Admitting: *Deleted

## 2015-12-10 ENCOUNTER — Ambulatory Visit (HOSPITAL_COMMUNITY)
Admission: RE | Admit: 2015-12-10 | Discharge: 2015-12-10 | Disposition: A | Discharge status: Home / Self Care | Payer: Medicare PPO | Source: Ambulatory Visit | Attending: Urology | Admitting: Urology

## 2015-12-10 ENCOUNTER — Encounter (HOSPITAL_COMMUNITY)
Admission: RE | Disposition: A | Discharge status: Home / Self Care | Payer: Self-pay | Source: Ambulatory Visit | Attending: Urology

## 2015-12-10 ENCOUNTER — Ambulatory Visit (HOSPITAL_COMMUNITY): Payer: Medicare PPO

## 2015-12-10 DIAGNOSIS — E669 Obesity, unspecified: Secondary | ICD-10-CM | POA: Insufficient documentation

## 2015-12-10 DIAGNOSIS — F419 Anxiety disorder, unspecified: Secondary | ICD-10-CM | POA: Insufficient documentation

## 2015-12-10 DIAGNOSIS — I10 Essential (primary) hypertension: Secondary | ICD-10-CM | POA: Insufficient documentation

## 2015-12-10 DIAGNOSIS — Z6832 Body mass index (BMI) 32.0-32.9, adult: Secondary | ICD-10-CM | POA: Diagnosis not present

## 2015-12-10 DIAGNOSIS — N200 Calculus of kidney: Secondary | ICD-10-CM | POA: Insufficient documentation

## 2015-12-10 DIAGNOSIS — K219 Gastro-esophageal reflux disease without esophagitis: Secondary | ICD-10-CM | POA: Diagnosis not present

## 2015-12-10 DIAGNOSIS — Z87442 Personal history of urinary calculi: Secondary | ICD-10-CM | POA: Insufficient documentation

## 2015-12-10 DIAGNOSIS — Z79899 Other long term (current) drug therapy: Secondary | ICD-10-CM | POA: Diagnosis not present

## 2015-12-10 HISTORY — DX: Unspecified osteoarthritis, unspecified site: M19.90

## 2015-12-10 HISTORY — DX: Personal history of other diseases of the digestive system: Z87.19

## 2015-12-10 LAB — CREATININE, SERUM
Creatinine, Ser: 0.82 mg/dL (ref 0.44–1.00)
GFR calc non Af Amer: >60 mL/min (ref >60)

## 2015-12-10 SURGERY — LITHOTRIPSY, ESWL
Anesthesia: LOCAL | Laterality: Right

## 2015-12-10 MED ORDER — HYDROMORPHONE HCL 4 MG PO TABS: 4.0000 mg | ORAL_TABLET | Freq: Four times a day (QID) | ORAL | Status: DC | PRN

## 2015-12-10 MED ORDER — DIAZEPAM 5 MG PO TABS
10.0000 mg | ORAL_TABLET | ORAL | Status: AC
  Administered 2015-12-10: 10 mg via ORAL
  Filled 2015-12-10: qty 2

## 2015-12-10 MED ORDER — DIPHENHYDRAMINE HCL 25 MG PO CAPS
25.0000 mg | ORAL_CAPSULE | ORAL | Status: AC
  Administered 2015-12-10: 25 mg via ORAL
  Filled 2015-12-10: qty 1

## 2015-12-10 MED ORDER — SODIUM CHLORIDE 0.9 % IV SOLN
INTRAVENOUS | Status: DC
  Administered 2015-12-10: 1000 mL via INTRAVENOUS

## 2015-12-10 MED ORDER — TAMSULOSIN HCL 0.4 MG PO CAPS: 0.4000 mg | ORAL_CAPSULE | Freq: Every day | ORAL | Status: DC

## 2015-12-10 NOTE — Interval H&P Note (Signed)
History and Physical Interval Note:  12/10/2015 10:35 AM  Servando Salina  has presented today for surgery, with the diagnosis of RIGHT RENAL CALCULUS  The various methods of treatment have been discussed with the patient and family. After consideration of risks, benefits and other options for treatment, the patient has consented to  Procedure(s): RIGHT EXTRACORPOREAL SHOCK WAVE LITHOTRIPSY (ESWL) (Right) as a surgical intervention .  The patient's history has been reviewed, patient examined, no change in status, stable for surgery.  I have reviewed the patient's chart and labs. KUB stable. Pt well without dysuria or fever. Questions were answered to the patient's satisfaction. She elects to proceed.    Tayron Hunnell

## 2015-12-10 NOTE — Discharge Instructions (Signed)
Lithotripsy, Care After °Refer to this sheet in the next few weeks. These instructions provide you with information on caring for yourself after your procedure. Your health care provider may also give you more specific instructions. Your treatment has been planned according to current medical practices, but problems sometimes occur. Call your health care provider if you have any problems or questions after your procedure. °WHAT TO EXPECT AFTER THE PROCEDURE  °· Your urine may have a red tinge for a few days after treatment. Blood loss is usually minimal. °· You may have soreness in the back or flank area. This usually goes away after a few days. The procedure can cause blotches or bruises on the back where the pressure wave enters the skin. These marks usually cause only minimal discomfort and should disappear in a short time. °· Stone fragments should begin to pass within 24 hours of treatment. However, a delayed passage is not unusual. °· You may have pain, discomfort, and feel sick to your stomach (nauseated) when the crushed fragments of stone are passed down the tube from the kidney to the bladder. Stone fragments can pass soon after the procedure and may last for up to 4-8 weeks. °· A small number of patients may have severe pain when stone fragments are not able to pass, which leads to an obstruction. °· If your stone is greater than 1 inch (2.5 cm) in diameter or if you have multiple stones that have a combined diameter greater than 1 inch (2.5 cm), you may require more than one treatment. °· If you had a stent placed prior to your procedure, you may experience some discomfort, especially during urination. You may experience the pain or discomfort in your flank or back, or you may experience a sharp pain or discomfort at the base of your penis or in your lower abdomen. The discomfort usually lasts only a few minutes after urinating. °HOME CARE INSTRUCTIONS  °· Rest at home until you feel your energy  improving. °· Only take over-the-counter or prescription medicines for pain, discomfort, or fever as directed by your health care provider. Depending on the type of lithotripsy, you may need to take antibiotics and anti-inflammatory medicines for a few days. °· Drink enough water and fluids to keep your urine clear or pale yellow. This helps "flush" your kidneys. It helps pass any remaining pieces of stone and prevents stones from coming back. °· Most people can resume daily activities within 1-2 days after standard lithotripsy. It can take longer to recover from laser and percutaneous lithotripsy. °· Strain all urine through the provided strainer. Keep all particulate matter and stones for your health care provider to see. The stone may be as small as a grain of salt. It is very important to use the strainer each and every time you pass your urine. Any stones that are found can be sent to a medical lab for examination. °· Visit your health care provider for a follow-up appointment in a few weeks. Your doctor may remove your stent if you have one. Your health care provider will also check to see whether stone particles still remain. °SEEK MEDICAL CARE IF:  °· Your pain is not relieved by medicine. °· You have a lasting nauseous feeling. °· You feel there is too much blood in the urine. °· You develop persistent problems with frequent or painful urination that does not at least partially improve after 2 days following the procedure. °· You have a congested cough. °· You feel   lightheaded. °· You develop a rash or any other signs that might suggest an allergic problem. °· You develop any reaction or side effects to your medicine(s). °SEEK IMMEDIATE MEDICAL CARE IF:  °· You experience severe back or flank pain or both. °· You see nothing but blood when you urinate. °· You cannot pass any urine at all. °· You have a fever or shaking chills. °· You develop shortness of breath, difficulty breathing, or chest pain. °· You  develop vomiting that will not stop after 6-8 hours. °· You have a fainting episode. °  °This information is not intended to replace advice given to you by your health care provider. Make sure you discuss any questions you have with your health care provider. °  °Document Released: 01/04/2008 Document Revised: 09/05/2015 Document Reviewed: 06/30/2013 °Elsevier Interactive Patient Education ©2016 [employer]    ° ° ° °                                                                                                           Moderate Conscious Sedation, Adult, Care After °Refer to this sheet in the next few weeks. These instructions provide you with information on caring for yourself after your procedure. Your health care provider may also give you more specific instructions. Your treatment has been planned according to current medical practices, but problems sometimes occur. Call your health care provider if you have any problems or questions after your procedure. °WHAT TO EXPECT AFTER THE PROCEDURE  °After your procedure: °· You may feel sleepy, clumsy, and have poor balance for several hours. °· Vomiting may occur if you eat too soon after the procedure. °HOME CARE INSTRUCTIONS °· Do not participate in any activities where you could become injured for at least 24 hours. Do not: °¨ Drive. °¨ Swim. °¨ Ride a bicycle. °¨ Operate heavy machinery. °¨ Cook. °¨ Use power tools. °¨ Climb ladders. °¨ Work from a high place. °· Do not make important decisions or sign legal documents until you are improved. °· If you vomit, drink water, juice, or soup when you can drink without vomiting. Make sure you have little or no nausea before eating solid foods. °· Only take over-the-counter or prescription medicines for pain, discomfort, or fever as directed by your health care provider. °· Make sure you and your family fully understand everything about the medicines given to you, including what side effects may occur. °· You should  not drink alcohol, take sleeping pills, or take medicines that cause drowsiness for at least 24 hours. °· If you smoke, do not smoke without supervision. °· If you are feeling better, you may resume normal activities 24 hours after you were sedated. °· Keep all appointments with your health care provider. °SEEK MEDICAL CARE IF: °· Your skin is pale or bluish in color. °· You continue to feel nauseous or vomit. °· Your pain is getting worse and is not helped by medicine. °· You have bleeding or swelling. °· You are still sleepy or feeling clumsy after 24 hours. °SEEK

## 2015-12-10 NOTE — Op Note (Signed)
Right Renal Stone   Right ESWL   Findings: Gated. Stone showed signs of fragmentation. Pt tolerated well.

## 2015-12-17 DIAGNOSIS — Z6832 Body mass index (BMI) 32.0-32.9, adult: Secondary | ICD-10-CM | POA: Diagnosis not present

## 2015-12-17 DIAGNOSIS — Z1389 Encounter for screening for other disorder: Secondary | ICD-10-CM | POA: Diagnosis not present

## 2015-12-17 DIAGNOSIS — B354 Tinea corporis: Secondary | ICD-10-CM | POA: Diagnosis not present

## 2015-12-17 DIAGNOSIS — N762 Acute vulvitis: Secondary | ICD-10-CM | POA: Diagnosis not present

## 2016-01-10 ENCOUNTER — Other Ambulatory Visit: Payer: Self-pay | Admitting: Urology

## 2016-01-22 ENCOUNTER — Telehealth: Payer: Self-pay | Admitting: Obstetrics and Gynecology

## 2016-01-22 NOTE — Telephone Encounter (Signed)
Dr.Silva, okay to switch patient to oral Estrace? Patient's current rx for Premarin 0.45 mg tablets take 1 tablet twice per day is not covered with her insurance.

## 2016-01-22 NOTE — Telephone Encounter (Signed)
Patient's insurance will not pay for the premarin prescription and patient would like to try something else. Patient's pharmacy suggested Estrace.

## 2016-01-23 MED ORDER — ESTRADIOL 0.5 MG PO TABS: 0.5000 mg | ORAL_TABLET | Freq: Every day | ORAL | Status: DC

## 2016-01-23 NOTE — Telephone Encounter (Signed)
Ok for estradiol 0.5 mg daily, #30, RF 8. This dosage of the estradiol should control her hot flashes.  She will take this only once daily.   Please send to pharmacy of choice.

## 2016-01-23 NOTE — Telephone Encounter (Signed)
Spoke with patient. Advised of message as seen below from [place]. Patient is agreeable. Advised I have sent Estradiol 0.5 mg tablets take 1 tablet daily #30 8RF to the [facility] on file. Patient is agreeable. Patient states she will pay the OOP cost for Estradiol to reduce the cost. Advised if she would like to have the rx filled at a local [place] the cost for a 30 day supply is $4. Patient would like to have this rx sent to [facility] in [place] as this will save her $16 dollars a month. Rx transferred to [place]. Patient is agreeable.  Routing to provider for final review. Patient agreeable to disposition. Will close encounter.

## 2016-02-19 ENCOUNTER — Telehealth: Payer: Self-pay

## 2016-02-19 NOTE — Telephone Encounter (Signed)
Notification received that the patient's insurance does not cover her rx for Estradiol 0.5 mg tablets. The patient is currently getting her rx filled with [facility]. OOP cost for Estradiol tablets at [facility] is $4 per month. Patient and I spoke about this on 01/22/2016 and she was agreeable to pay OOP cost. Left message to call [place] at [phone]. Want to ensure patient has been able to get this rx filled as this notification from her insurance is new.

## 2016-02-20 NOTE — Telephone Encounter (Signed)
Spoke with patient. Patient states that she has been paying OOP cost for Estradiol tablets and has not had any trouble filling her medication. States her insurance changed at the first of the year and feels this form may be due to new insurance coverage. She is agreeable to continue to pay OOP cost for her rx. States she would like Dr.Silva to know since she has switched to taking Estradiol 0.5 mg daily she has been doing well at night with "only a couple of hot flashes" but around mid morning each day she is experiencing increased anxiety and feeling jittery. She is taking half a Xanax (.5 mg) each morning which provides her with relief. Advised I will provide Dr.Silva with an update on how she has been doing and return call with any further recommendations. She is agreeable.

## 2016-02-20 NOTE — Telephone Encounter (Signed)
Patient is returning a call to Kait[place]. °

## 2016-02-20 NOTE — Telephone Encounter (Signed)
I am pleased to know that the patient is doing well.  You may close the encounter.

## 2016-02-28 ENCOUNTER — Encounter (HOSPITAL_COMMUNITY): Payer: Self-pay | Admitting: *Deleted

## 2016-02-29 ENCOUNTER — Other Ambulatory Visit: Payer: Self-pay | Admitting: Urology

## 2016-02-29 NOTE — H&P (Signed)
History of Present Illness Ms. Barmes is a 70 year old previously followed by Dr. Tresa Endo with the following urologic history:    1) Urolithiasis: She has a history of calcium oxalate urolithiasis. She has a history of low urine volume and hyperoxaluria.  Current treatment: Fluid hydration, calcium citrate, low oxalate diet, high citrate diet, HCTZ 25 mg/12.5 mg    1995: ESWL  Apr 2013: ESWL 8 mm left ureteral stone  June 2013: ESWL of multiple left renal calculi  Aug 2013: 24 hr urine - low urine volume, hyperoxaluria  Feb 2014: 24 hr urine - Stone risk significantly reduced  Apr 2015: 24 hr urine - Calcium and oxalate increased  Dec 2015: 24 hr urine - Hypercalciuria and high urine sodium - increased HCTZ to 25 mg q am and 12.5 mg q pm, reduce dietary sodium  Jul 2016: 24 hr urine - Hyeroxaluria and hypocitraturia - not compliant with calcium treatment -- plan to increase calcium supplement compliance  Dec 2016: ESWL of right renal calculus    Interval history:    Ms. Petterson follows up today a few weeks out from her recent lithotripsy by Dr. Junious Silk. She tolerated the procedure very well and currently has some very mild residual right-sided flank pain but denies any significant pain or hematuria. She follows up today with a KUB x-ray and renal ultrasound. She describes alopecia associated with taking tamsulosin therefore stopped this medication.     Past Medical History Problems  1. History of Anxiety (F41.9) 2. History of esophageal reflux (Z87.19) 3. History of hypertension (Z86.79) 4. History of Irritable bowel syndrome with diarrhea (K58.0)  Surgical History Problems  1. History of Gallbladder Surgery 2. History of Hysterectomy 3. History of Renal Lithotripsy 4. History of Renal Lithotripsy 5. History of Renal Lithotripsy 6. History of Tonsillectomy 7. History of Tubal Ligation  Current Meds 1. Amerge 2.5 MG Oral Tablet;  Therapy:  (Recorded:31Mar2015) to Recorded 2. Benadryl TABS;  Therapy: (Recorded:17Apr2013) to Recorded 3. Claritin TABS;  Therapy: (Recorded:16Jul2013) to Recorded 4. Clobetasol Propionate 0.05 % External Foam;  Therapy: 21Oct2015 to Recorded 5. Desoximetasone 0.05 % External Ointment;  Therapy: [phone] to Recorded 6. HydroCHLOROthiazide 25 MG Oral Tablet; Take 1 tablet twice daily;  Therapy: [id number] to (Renew:06May2017)  Requested for: [id number]; Last  Rx:11May2016 Ordered 7. Ipratropium Bromide 0.06 % Nasal Solution;  Therapy: [id number] to Recorded 8. Montelukast Sodium 10 MG Oral Tablet;  Therapy: [phone] to Recorded 9. Premarin 0.45 MG Oral Tablet;  Therapy: 06Oct2016 to Recorded 10. Vitamin D-3 5000 UNIT TABS;   Therapy: (Recorded:14Jan2014) to Recorded 11. Xanax 1 MG Oral Tablet;   Therapy: (Recorded:17Apr2013) to Recorded  Allergies Medication  1. Keflex TABS 2. PredniSONE TABS 3. Tamsulosin HCl CAPS 4. Ciprofloxacin HCl TABS 5. Penicillins 6. Sulfa Drugs Non-Medication  7. Adhesive Tape 8. Latex  Family History Problems  1. Family history of Acute Myocardial Infarction : Mother 2. Family history of Acute Myocardial Infarction   grandparents 3. Family history of Chronic Obstructive Pulmonary Disease : Mother 4. Family history of Diabetes Mellitus 5. Family history of Ischemic Stroke : Mother  Social History Problems  1. Denied: Alcohol Use (History) 2. Family history of Death In The Family Mother   age 76 emphysema COPD 21. Marital History - Currently Married 4. Never A Smoker 5. Occupation:   asst clerk of court 6. Denied: Tobacco Use  Vitals Vital Signs [Data Includes: Last 1 Day]  Recorded: [id number] 11:58AM  Blood Pressure: 145 / 87 Heart  Rate: 87  Physical Exam Constitutional: Well nourished and well developed . No acute distress.  Pulmonary: No respiratory distress and normal respiratory rhythm and effort.  Cardiovascular: Heart rate and  rhythm are normal . No peripheral edema.  Abdomen: No CVA tenderness.    Results/Data Urine [Data Includes: Last 1 Day]   [id number]  COLOR YELLOW   APPEARANCE CLEAR   SPECIFIC GRAVITY 1.015   pH 6.0   GLUCOSE NEGATIVE   BILIRUBIN NEGATIVE   KETONE NEGATIVE   BLOOD TRACE   PROTEIN NEGATIVE   NITRITE NEGATIVE   LEUKOCYTE ESTERASE TRACE   SQUAMOUS EPITHELIAL/HPF 0-5 HPF  WBC 6-10 WBC/HPF  RBC 3-10 RBC/HPF  BACTERIA NONE SEEN HPF  CRYSTALS NONE SEEN HPF  CASTS NONE SEEN LPF  Yeast NONE SEEN HPF   I independently reviewed her KUB x-ray today. This demonstrates that the large right renal calculus is no longer demonstrated. Instead, she appears to have 2 smaller fragments in the lower pole of the right kidney measuring approximately 3 and 4 mm respectively. No other obvious calcifications are noted over the right renal shadow. No calcifications noted over the expected course of the right ureter. Of the left renal shadow, there remains stable calcifications in the upper pole of left kidney measuring approximately 9 mm and lower pole of the left kidney measuring approximately 9-10 mm. These remain and stable locations. No calcifications over the expected course of the left ureter.    I also independently reviewed the patient's renal ultrasound. This is dictated separately.   Assessment Assessed  1. Nephrolithiasis (N20.0)  Plan Health Maintenance  1. UA With REFLEX; [Do Not Release]; Status:Complete;   [id number] 11:25AM Nephrolithiasis  2. Follow-up Office  Follow-up  Status: Hold For - Date of Service  Requested for:  [id number]  Discussion/Summary 1. Right renal calculus: Her stone fragments have been sent for analysis. She appears to have had successful treatment although has been instructed that she does have 2 small remaining fragments. She will notify me should she develop significant right-sided flank pain. She understands that the stone fragments are likely  passable.    2. Left renal calculi: We again reviewed options regarding her left renal calculi. We discussed options that would include continued observation versus proactive treatment. Similar to the right side, she does wish to be proactive considering her inability to tolerate medical therapy. After reviewing options and understanding the pros and cons of available options, she still adamantly wishes to proceed with shockwave lithotripsy and wishes to avoid more invasive treatment. She therefore will be scheduled for left extracorporeal shockwave lithotripsy of her left renal calculi. We have reviewed potential risks and complications as well as the expected recovery process. She gives informed consent.    Cc: Dr. Sharilyn Sites     Signatures Electronically signed by : Raynelle Bring, M.D.; Jan 08 2016  1:00PM EST

## 2016-03-03 ENCOUNTER — Encounter (HOSPITAL_COMMUNITY): Payer: Self-pay | Admitting: *Deleted

## 2016-03-03 ENCOUNTER — Encounter (HOSPITAL_COMMUNITY)
Admission: RE | Disposition: A | Discharge status: Home / Self Care | Payer: Self-pay | Source: Ambulatory Visit | Attending: Urology

## 2016-03-03 ENCOUNTER — Ambulatory Visit (HOSPITAL_COMMUNITY)
Admission: RE | Admit: 2016-03-03 | Discharge: 2016-03-03 | Disposition: A | Discharge status: Home / Self Care | Payer: Medicare Other | Source: Ambulatory Visit | Attending: Urology | Admitting: Urology

## 2016-03-03 ENCOUNTER — Ambulatory Visit (HOSPITAL_COMMUNITY): Payer: Medicare Other

## 2016-03-03 DIAGNOSIS — Z880 Allergy status to penicillin: Secondary | ICD-10-CM | POA: Diagnosis not present

## 2016-03-03 DIAGNOSIS — Z888 Allergy status to other drugs, medicaments and biological substances status: Secondary | ICD-10-CM | POA: Diagnosis not present

## 2016-03-03 DIAGNOSIS — Z881 Allergy status to other antibiotic agents status: Secondary | ICD-10-CM | POA: Diagnosis not present

## 2016-03-03 DIAGNOSIS — Z79899 Other long term (current) drug therapy: Secondary | ICD-10-CM | POA: Diagnosis not present

## 2016-03-03 DIAGNOSIS — Z882 Allergy status to sulfonamides status: Secondary | ICD-10-CM | POA: Insufficient documentation

## 2016-03-03 DIAGNOSIS — I10 Essential (primary) hypertension: Secondary | ICD-10-CM | POA: Diagnosis not present

## 2016-03-03 DIAGNOSIS — K580 Irritable bowel syndrome with diarrhea: Secondary | ICD-10-CM | POA: Diagnosis not present

## 2016-03-03 DIAGNOSIS — N200 Calculus of kidney: Secondary | ICD-10-CM | POA: Insufficient documentation

## 2016-03-03 DIAGNOSIS — K219 Gastro-esophageal reflux disease without esophagitis: Secondary | ICD-10-CM | POA: Insufficient documentation

## 2016-03-03 DIAGNOSIS — Z90710 Acquired absence of both cervix and uterus: Secondary | ICD-10-CM | POA: Insufficient documentation

## 2016-03-03 DIAGNOSIS — F419 Anxiety disorder, unspecified: Secondary | ICD-10-CM | POA: Insufficient documentation

## 2016-03-03 DIAGNOSIS — Z91040 Latex allergy status: Secondary | ICD-10-CM | POA: Insufficient documentation

## 2016-03-03 SURGERY — LITHOTRIPSY, ESWL
Anesthesia: LOCAL | Laterality: Left

## 2016-03-03 MED ORDER — SODIUM CHLORIDE 0.9 % IV SOLN
INTRAVENOUS | Status: DC
  Administered 2016-03-03: 10:00:00 via INTRAVENOUS

## 2016-03-03 MED ORDER — DIAZEPAM 5 MG PO TABS
10.0000 mg | ORAL_TABLET | ORAL | Status: AC
  Administered 2016-03-03: 10 mg via ORAL
  Filled 2016-03-03: qty 2

## 2016-03-03 MED ORDER — DIPHENHYDRAMINE HCL 25 MG PO CAPS
25.0000 mg | ORAL_CAPSULE | ORAL | Status: AC
  Administered 2016-03-03: 25 mg via ORAL
  Filled 2016-03-03: qty 1

## 2016-03-03 NOTE — Interval H&P Note (Signed)
History and Physical Interval Note:  03/03/2016 11:32 AM  Victoria Andrade  has presented today for surgery, with the diagnosis of LEFT RENAL CALCULI  The various methods of treatment have been discussed with the patient and family. After consideration of risks, benefits and other options for treatment, the patient has consented to  Procedure(s): LEFT EXTRACORPOREAL SHOCK WAVE LITHOTRIPSY (ESWL) (Left) as a surgical intervention .  The patient's history has been reviewed, patient examined, no change in status, stable for surgery.  I have reviewed the patient's chart and labs.  Questions were answered to the patient's satisfaction.     Damaree Sargent,LES

## 2016-03-03 NOTE — Discharge Instructions (Signed)
1. You should strain your urine and collect all fragments and bring them to your follow up appointment.  °2. You should take your pain medication as needed.  Please call if your pain is severe to the point that it is not controlled with your pain medication. °3. You should call if you develop fever > 101 or persistent nausea or vomiting. °4. Your doctor may prescribe tamsulosin to take to help facilitate stone passage. ° °Moderate Conscious Sedation, Adult, Care After °Refer to this sheet in the next few weeks. These instructions provide you with information on caring for yourself after your procedure. Your health care provider may also give you more specific instructions. Your treatment has been planned according to current medical practices, but problems sometimes occur. Call your health care provider if you have any problems or questions after your procedure. °WHAT TO EXPECT AFTER THE PROCEDURE  °After your procedure: °· You may feel sleepy, clumsy, and have poor balance for several hours. °· Vomiting may occur if you eat too soon after the procedure. °HOME CARE INSTRUCTIONS °· Do not participate in any activities where you could become injured for at least 24 hours. Do not: °¨ Drive. °¨ Swim. °¨ Ride a bicycle. °¨ Operate heavy machinery. °¨ Cook. °¨ Use power tools. °¨ Climb ladders. °¨ Work from a high place. °· Do not make important decisions or sign legal documents until you are improved. °· If you vomit, drink water, juice, or soup when you can drink without vomiting. Make sure you have little or no nausea before eating solid foods. °· Only take over-the-counter or prescription medicines for pain, discomfort, or fever as directed by your health care provider. °· Make sure you and your family fully understand everything about the medicines given to you, including what side effects may occur. °· You should not drink alcohol, take sleeping pills, or take medicines that cause drowsiness for at least 24  hours. °· If you smoke, do not smoke without supervision. °· If you are feeling better, you may resume normal activities 24 hours after you were sedated. °· Keep all appointments with your health care provider. °SEEK MEDICAL CARE IF: °· Your skin is pale or bluish in color. °· You continue to feel nauseous or vomit. °· Your pain is getting worse and is not helped by medicine. °· You have bleeding or swelling. °· You are still sleepy or feeling clumsy after 24 hours. °SEEK IMMEDIATE MEDICAL CARE IF: °· You develop a rash. °· You have difficulty breathing. °· You develop any type of allergic problem. °· You have a fever. °MAKE SURE YOU: °· Understand these instructions. °· Will watch your condition. °· Will get help right away if you are not doing well or get worse. °  °This information is not intended to replace advice given to you by your health care provider. Make sure you discuss any questions you have with your health care provider. °  °Document Released: 10/05/2013 Document Revised: 01/05/2015 Document Reviewed: 10/05/2013 °Elsevier Interactive Patient Education ©2016 [employer] Inc. ° °

## 2016-03-03 NOTE — Op Note (Signed)
Please see scanned chart notes for ESWL operative note.

## 2016-03-03 NOTE — Interval H&P Note (Signed)
History and Physical Interval Note:  03/03/2016 11:28 AM  Victoria Andrade  has presented today for surgery, with the diagnosis of LEFT RENAL CALCULI  The various methods of treatment have been discussed with the patient and family. After consideration of risks, benefits and other options for treatment, the patient has consented to  Procedure(s): LEFT EXTRACORPOREAL SHOCK WAVE LITHOTRIPSY (ESWL) (Left) as a surgical intervention .  The patient's history has been reviewed, patient examined, no change in status, stable for surgery.  I have reviewed the patient's chart and labs.  Questions were answered to the patient's satisfaction.     Tylan Briguglio,LES

## 2016-03-04 ENCOUNTER — Other Ambulatory Visit: Payer: Self-pay

## 2016-03-04 DIAGNOSIS — Z1231 Encounter for screening mammogram for malignant neoplasm of breast: Secondary | ICD-10-CM

## 2016-04-11 ENCOUNTER — Ambulatory Visit
Admission: RE | Admit: 2016-04-11 | Discharge: 2016-04-11 | Disposition: A | Discharge status: Home / Self Care | Payer: Medicare Other | Source: Ambulatory Visit

## 2016-04-11 DIAGNOSIS — Z1231 Encounter for screening mammogram for malignant neoplasm of breast: Secondary | ICD-10-CM

## 2016-09-03 ENCOUNTER — Ambulatory Visit (HOSPITAL_COMMUNITY): Payer: Medicare Other | Attending: Physician Assistant

## 2016-09-03 DIAGNOSIS — M62838 Other muscle spasm: Secondary | ICD-10-CM

## 2016-09-03 DIAGNOSIS — M6281 Muscle weakness (generalized): Secondary | ICD-10-CM

## 2016-09-03 DIAGNOSIS — R2681 Unsteadiness on feet: Secondary | ICD-10-CM

## 2016-09-03 NOTE — Therapy (Signed)
[place], [place], [address] Phone: [phone]   Fax:  [phone]  Physical Therapy Evaluation  Patient Details  Name: TAHJANAE KINZER MRN: [id number] Date of Birth: 1946/07/08 Referring Provider: Molli Barrows   Encounter Date: 09/03/2016      PT End of Session - 09/03/16 2118    Visit Number 1   Number of Visits 16   Date for PT Re-Evaluation 10/03/16   Authorization Type U[employer] Medicare    Authorization Time Period 09/03/16-11/03/16   Authorization - Visit Number 1   Authorization - Number of Visits 10   PT Start Time [id number]   PT Stop Time 1450   PT Time Calculation (min) 61 min   Activity Tolerance Patient tolerated treatment well   Behavior During Therapy [facility] for tasks assessed/performed      Past Medical History:  Diagnosis Date  . Anxiety    takes xanax  . Arthritis    fingers and neck  . Cataract immature   . Elevated hemoglobin A1c March 2016   level 6.2  . GERD (gastroesophageal reflux disease)   . Headache(784.0)    migraines  . History of hiatal hernia   . Hypertension   . IBS (irritable bowel syndrome)   . Kidney stones   . Melanoma ([place])    left eye  . Osteopenia 2016  . PONV (postoperative nausea and vomiting)     Past Surgical History:  Procedure Laterality Date  . ABDOMINAL HYSTERECTOMY  1978   partial  . BILATERAL SALPINGOOPHORECTOMY  2010   with robot  . CHOLECYSTECTOMY  2005   lap  . CYSTOSCOPY N/A 03/13/2015   Procedure: CYSTOSCOPY;  Surgeon: Nunzio Cobbs, MD;  Location: [facility];  Service: Gynecology;  Laterality: N/A;  . LITHOTRIPSY     20 years ago  . ROBOTIC ASSISTED SALPINGO OOPHERECTOMY Left 03/13/2015   Procedure: ROBOTIC ASSISTED INCISION OF  LEFT PELVIC MASS, LYSIS OF ADHESIONS  WITH PELVIC WASHINGS;  Surgeon: Nunzio Cobbs, MD;  Location: [facility];  Service: Gynecology;  Laterality: Left;  . TONSILLECTOMY     as child  . TUBAL LIGATION  1976     There were no vitals filed for this visit.       Subjective Assessment - 09/03/16 1350    Subjective Pt reports she is here for pain in her Left hip, which has been a chronic issue, intermittently aggravated. She has gotten injection and PT in the past with some success but no complete remittance.    Pertinent History Denies surgical issues on that knee; 9/10 at worse (night); 1/10 at best; particularly bad in mornings, but is limited to sleeping in a recliner.    Limitations Sitting;Standing;Walking   How long can you sit comfortably? 90-120 minutes sitting;    How long can you stand comfortably? only aggravated by prolonged standing still, 30-40 minutes   How long can you walk comfortably? Tolerated well to her memory, does not frequently walk long durations; frequent stairs are aggravating.    Diagnostic tests X-ray recently, pt reports unremarkable.    Patient Stated Goals Have fewer limitatiosn during sleep and management of stairs.    Currently in Pain? Yes   Pain Score 0-No pain            [facility] PT Assessment - 09/03/16 0001      Assessment   Medical Diagnosis Left hip pain   Referring Provider Richardson Landry  [place]    Onset Date/Surgical Date 04/28/16   Hand Dominance Right   Next MD Visit October 27th, 2017   Prior Therapy Has done PT for this issue before; ~2012     Precautions   Precautions None     Balance Screen   Has the patient fallen in the past 6 months No   Has the patient had a decrease in activity level because of a fear of falling?  Yes   Is the patient reluctant to leave their home because of a fear of falling?  No     Home Environment   Additional Comments No stairs at home.      Prior Function   Level of Independence Independent   Vocation Retired   Leisure Reading;      ROM / Strength   AROM / PROM / Arts administrator   Strength Assessment Site Hip;Knee   Right/Left Hip Right;Left   Right Hip Flexion 4+/5   Right Hip Extension  4/5  HS dominant, weak Glute max   Right Hip External Rotation  5/5   Right Hip Internal Rotation 5/5   Right Hip ABduction 4/5   Right Hip ADduction 5/5   Left Hip Flexion 4-/5   Left Hip Extension 4/5  HS dominant, weak Glute max   Left Hip External Rotation 5/5  does not provoke pain   Left Hip Internal Rotation 5/5  does not provoke pain   Left Hip ABduction 4/5   Left Hip ADduction 5/5  aggravates pain     Palpation   Palpation comment Painful response to Glute Med, Glute Max, Piriformis bilat, L>R.   Taught bands detected.      Special Tests    Special Tests Hip Special Tests   Hip Special Tests  Saralyn Pilar (FABER) Test;Hip Scouring;Anterior Hip Impingement Test;Other     Saralyn Pilar (FABER) Test   Findings Negative   Side Right;Left     Hip Scouring   Findings Negative   Side Right;Left     Anterior Hip Impingement Test    Findings Negative   Side  Left;Right     other   Findings Negative   Side Left   Comments External Derotation Test     Bed Mobility   Bed Mobility Rolling Right;Rolling Left;Right Sidelying to Sit;Left Sidelying to Sit   Rolling Right 5: Supervision   Rolling Left 5: Supervision   Right Sidelying to Sit 5: Supervision   Left Sidelying to Sit 5: Supervision     Transfers   Five time sit to stand comments  13.36s, hands free     Ambulation/Gait   Ambulation Distance (Feet) 675 Feet   Assistive device None   Gait velocity 1.54m/s   Gait Comments Pain increases from 1/10 to 2/10 after 400'     Balance   Balance Assessed Yes     High Level Balance   High Level Balance Comments SLS Balance: R:4s; L:18.5s (c trendelenburg sign)                   [facility] Adult PT Treatment/Exercise - 09/03/16 0001      Exercises   Exercises Knee/Hip     Knee/Hip Exercises: Stretches   Other Knee/Hip Stretches FABER Stretch Supine: 2x30s bil  FADIR Stretch Supine: 2x30s bil   Other Knee/Hip Stretches Knee to Opposite Shoulder: 2x30sec bil   Fig-4 Stretch: 2x30s Supine     Knee/Hip Exercises: Supine   Bridges 2 sets;10  reps  (HEP [place])                PT Education - 09/03/16 2117    Education provided Yes   Education Details Examination findings largely indicative of myalgia as source of CC.    Person(s) Educated Patient   Methods Explanation   Comprehension Verbalized understanding          PT Short Term Goals - 09/03/16 2133      PT SHORT TERM GOAL #1   Title After 2 weeks pt will demonstrate independence in HEP with items availabel for pain management.    Status New     PT SHORT TERM GOAL #2   Title After 4 weeks pt will demonstrate improved global strength in BLE with 30s Chair Rise>14x.    Status New     PT SHORT TERM GOAL #3   Title After 4 weeks pt will demonstrate improved improved hip strength/balance with SLS>10s bilat.      PT SHORT TERM GOAL #4   Title After 4 weeks pt will demonstrate improved fascial restrictions within the bilat gluteal muscularture tolerating deep palaption with pain <2/10.    Status New           PT Long Term Goals - 09/03/16 2140      PT LONG TERM GOAL #1   Title After 7 weeks pt will demonstrate independence in HEP with items available for continued progress s/p DC.    Status New     PT LONG TERM GOAL #2   Title After 7 weeks pt will demonstrate improved global strength in BLE with 30s Chair Rise>17x.   Status New     PT LONG TERM GOAL #3   Title After 7 weeks pt will demonstrate improved improved hip strength/balance with SLS>30s bilat.    Status New     PT LONG TERM GOAL #4   Title After 7 weeks pt will demonstrate improved fascial restrictions within the bilat gluteal muscularture tolerating deep palaption without pain.                Plan - 09/03/16 2120    Clinical Impression Statement Javanna Shallcross is a 70yo white female presenting with acute on chronic Left sided hip pain, that is aggravted by sleeping posture, prolonged standing,  stairs, and prolonged sitting. The patient's pain ranges from 1/10-9/10 and is activity driven. Examination reveals no gross assymetries in hip range, nor abnormal ROM on the Left hip joint. Strength testing reveals some deficits in hip extension and hip flexion, and some aggravaton of symptoms with adduction MMT. Sensation appears intact. Special tests for HipOA, Gluteal tendon/bursa pathology are negative. Balance is mildly impaired bilaterally. Gait speed is WNL for age. Functional strength is mildly impaired for age with 5xSTS: 13.36s. Palpation of gluteal musculature, as well as the L inguinal region all produce pain, with intermittent areas of taught bands in the tissue that respond well to sustained tissue compression. The patient demonstrates difficulty performing all bed mobility and complains of inability to come to standing from the floor. The aforementioned impairments are creating limitations in the patients ability to participate in ADL/IADL, take part in familial activities, and tolerate sleeping in her bed at night.    Rehab Potential Good   Clinical Impairments Affecting Rehab Potential Chonicity of complaint   PT Frequency 2x / week   PT Treatment/Interventions Electrical Stimulation;Moist Heat;Gait training;Stair training;Functional mobility training;Therapeutic activities;Therapeutic exercise;Patient/family education;Neuromuscular re-education;Balance training;Dry needling;Passive range of motion;Manual techniques  PT Next Visit Plan Screen Lumbar Spine, SI Joint, BBT; MFR to glutes.    PT Home Exercise Plan Faber stretch, Fadir Stretch, Bridging, Piriformis Stretch, Glute Max Stretch    Consulted and Agree with Plan of Care Patient      Patient will benefit from skilled therapeutic intervention in order to improve the following deficits and impairments:  Decreased activity tolerance, Decreased balance, Difficulty walking, Increased edema, Obesity, Decreased strength, Increased muscle  spasms, Postural dysfunction, Improper body mechanics, Pain, Increased fascial restricitons  Visit Diagnosis: Other muscle spasm - Plan: PT plan of care cert/re-cert  Muscle weakness (generalized) - Plan: PT plan of care cert/re-cert  Unsteadiness on feet - Plan: PT plan of care cert/re-cert      G-Codes - [id number]    Functional Assessment Tool Used Clinical Judgment    Functional Limitation Mobility: Walking and moving around   Mobility: Walking and Moving Around Current Status [phone]) At least 20 percent but less than 40 percent impaired, limited or restricted   Mobility: Walking and Moving Around Goal Status [phone]) At least 20 percent but less than 40 percent impaired, limited or restricted       Problem List Patient Active Problem List   Diagnosis Date Noted  . OTHER DISEASES OF VOCAL CORDS 01/01/2009  . GERD 01/01/2009  . COUGH 01/01/2009    9:48 PM, 09/03/16 Etta Grandchild, PT, DPT Physical Therapist at [facility] [phone] (office)     [place] [address] [place], [place], [address] Phone: [phone]   Fax:  [phone]  Name: CASSADY BOYKE MRN: [id number] Date of Birth: [birth year]

## 2016-09-09 ENCOUNTER — Ambulatory Visit (HOSPITAL_COMMUNITY): Payer: Medicare Other

## 2016-09-09 DIAGNOSIS — M62838 Other muscle spasm: Secondary | ICD-10-CM

## 2016-09-09 DIAGNOSIS — R2681 Unsteadiness on feet: Secondary | ICD-10-CM

## 2016-09-09 DIAGNOSIS — M6281 Muscle weakness (generalized): Secondary | ICD-10-CM

## 2016-09-09 NOTE — Therapy (Signed)
[place], [place], [address] Phone: [phone]   Fax:  [phone]  Physical Therapy Treatment  Patient Details  Name: Victoria Andrade MRN: [id number] Date of Birth: [birth year] Referring Provider: Molli Barrows   Encounter Date: 09/09/2016      PT End of Session - 09/09/16 1234    Visit Number 2   Number of Visits 16   Date for PT Re-Evaluation 10/03/16   Authorization Type [employer]HC Medicare    Authorization Time Period 09/03/16-11/03/16   Authorization - Visit Number 2   Authorization - Number of Visits 10   PT Start Time 1117   PT Stop Time 1204   PT Time Calculation (min) 47 min   Activity Tolerance Patient tolerated treatment well   Behavior During Therapy [facility] for tasks assessed/performed      Past Medical History:  Diagnosis Date  . Anxiety    takes xanax  . Arthritis    fingers and neck  . Cataract immature   . Elevated hemoglobin A1c March 2016   level 6.2  . GERD (gastroesophageal reflux disease)   . Headache(784.0)    migraines  . History of hiatal hernia   . Hypertension   . IBS (irritable bowel syndrome)   . Kidney stones   . Melanoma ([place])    left eye  . Osteopenia 2016  . PONV (postoperative nausea and vomiting)     Past Surgical History:  Procedure Laterality Date  . ABDOMINAL HYSTERECTOMY  1978   partial  . BILATERAL SALPINGOOPHORECTOMY  2010   with robot  . CHOLECYSTECTOMY  2005   lap  . CYSTOSCOPY N/A 03/13/2015   Procedure: CYSTOSCOPY;  Surgeon: Nunzio Cobbs, MD;  Location: [facility];  Service: Gynecology;  Laterality: N/A;  . LITHOTRIPSY     20 years ago  . ROBOTIC ASSISTED SALPINGO OOPHERECTOMY Left 03/13/2015   Procedure: ROBOTIC ASSISTED INCISION OF  LEFT PELVIC MASS, LYSIS OF ADHESIONS  WITH PELVIC WASHINGS;  Surgeon: Nunzio Cobbs, MD;  Location: [facility];  Service: Gynecology;  Laterality: Left;  . TONSILLECTOMY     as child  . TUBAL LIGATION  1976     There were no vitals filed for this visit.      Subjective Assessment - 09/09/16 1121    Subjective Pt reports since evaluation she has been doing well. She says the rotational stretches have been irritating an old injury in her left knee. She also reports that her hip has felt fairly good until last night wherein she was unable to fnd a posture of comfort. Her leg pulling stretches are aggravating he rshoulder pain bil.    Currently in Pain? Yes   Pain Score 2    Pain Location Hip   Pain Orientation Left;Lateral   Pain Descriptors / Indicators Aching;Dull   Pain Type Chronic pain   Pain Onset Yesterday   Pain Frequency Several days a week                         [facility] Adult PT Treatment/Exercise - 09/09/16 0001      Transfers   Five time sit to stand comments  --  30 second chair rise *see below    Number of Reps Other reps (comment);Other sets (comment)  30 sec chair rise     Knee/Hip Exercises: Stretches   Other Knee/Hip Stretches FABER Stretch Supine: 2x60s bil (low tension)  FADIR Stretch Supine: 2x30s bil (low tension)    Other Knee/Hip Stretches Fig-4 Stretch: 2x30s (HEP updated to sitting)     Manual Therapy   Manual Therapy Myofascial release   Manual therapy comments Left GLute med: 8 minutes Trigger point release  5 minutes myofascial stretching                PT Education - 09/09/16 1232    Education provided Yes   Education Details Switching to low load long duration stretching to reduce aggravation of knee joint on Left.    Person(s) Educated Patient   Methods Explanation   Comprehension Verbalized understanding          PT Short Term Goals - 09/03/16 2133      PT SHORT TERM GOAL #1   Title After 2 weeks pt will demonstrate independence in HEP with items availabel for pain management.    Status New     PT SHORT TERM GOAL #2   Title After 4 weeks pt will demonstrate improved global strength in BLE with 30s Chair  Rise>14x.    Status New     PT SHORT TERM GOAL #3   Title After 4 weeks pt will demonstrate improved improved hip strength/balance with SLS>10s bilat.      PT SHORT TERM GOAL #4   Title After 4 weeks pt will demonstrate improved fascial restrictions within the bilat gluteal muscularture tolerating deep palaption with pain <2/10.    Status New           PT Long Term Goals - 09/03/16 2140      PT LONG TERM GOAL #1   Title After 7 weeks pt will demonstrate independence in HEP with items available for continued progress s/p DC.    Status New     PT LONG TERM GOAL #2   Title After 7 weeks pt will demonstrate improved global strength in BLE with 30s Chair Rise>17x.   Status New     PT LONG TERM GOAL #3   Title After 7 weeks pt will demonstrate improved improved hip strength/balance with SLS>30s bilat.    Status New     PT LONG TERM GOAL #4   Title After 7 weeks pt will demonstrate improved fascial restrictions within the bilat gluteal muscularture tolerating deep palaption without pain.                Plan - 09/09/16 1235    Clinical Impression Statement Goals are reviewed this session, as well as findings from evaluation and assessment. The patient reports some intermittent reduction in overall pain, but with some aggravation of her left knee (remote injury with chronic dyscomfort). HEP is reviewed and updated to address knee stresses. Pt respondes well to manual therapy to address myofascial restrictions and trigger points/taut bands in the left glute med, with moderate sorenss and increased mobility afterward.    Rehab Potential Good   Clinical Impairments Affecting Rehab Potential Chonicity of complaint   PT Frequency 2x / week   PT Treatment/Interventions Electrical Stimulation;Moist Heat;Gait training;Stair training;Functional mobility training;Therapeutic activities;Therapeutic exercise;Patient/family education;Neuromuscular re-education;Balance training;Dry  needling;Passive range of motion;Manual techniques   PT Next Visit Plan Berg Balance Test; 30s Chair Rise; Continue MFR to Left Med; Try half kneeling core stabilization/balance next time; try quadruped hip extension and fire hydrant.    PT Home Exercise Plan 9/12: Corky Sox stretch, Fadir Stretch, Bridging, Piriformis Stretch (seated);    Consulted and Agree with Plan of Care Patient  Patient will benefit from skilled therapeutic intervention in order to improve the following deficits and impairments:  Decreased activity tolerance, Decreased balance, Difficulty walking, Increased edema, Obesity, Decreased strength, Increased muscle spasms, Postural dysfunction, Improper body mechanics, Pain, Increased fascial restricitons  Visit Diagnosis: Other muscle spasm  Muscle weakness (generalized)  Unsteadiness on feet     Problem List Patient Active Problem List   Diagnosis Date Noted  . OTHER DISEASES OF VOCAL CORDS 01/01/2009  . GERD 01/01/2009  . COUGH 01/01/2009    12:41 PM, 09/09/16 Etta Grandchild, PT, DPT Physical Therapist at [facility] [phone] (office)     [place] [address] [place], [place], [address] Phone: [phone]   Fax:  [phone]  Name: VERLE PATTAN MRN: [id number] Date of Birth: [birth year]

## 2016-09-11 ENCOUNTER — Encounter (HOSPITAL_COMMUNITY): Payer: Medicare Other

## 2016-09-15 ENCOUNTER — Ambulatory Visit (HOSPITAL_COMMUNITY): Payer: Medicare Other

## 2016-09-15 DIAGNOSIS — M6281 Muscle weakness (generalized): Secondary | ICD-10-CM

## 2016-09-15 DIAGNOSIS — M62838 Other muscle spasm: Secondary | ICD-10-CM | POA: Diagnosis not present

## 2016-09-15 DIAGNOSIS — R2681 Unsteadiness on feet: Secondary | ICD-10-CM

## 2016-09-15 NOTE — Therapy (Signed)
[place], [place], [address] Phone: [phone]   Fax:  [phone]  Physical Therapy Treatment  Patient Details  Name: Victoria Andrade MRN: [id number] Date of Birth: September 15, 1946 Referring Provider: Molli Barrows   Encounter Date: 09/15/2016      PT End of Session - 09/15/16 1144    Visit Number 3   Number of Visits 16   Date for PT Re-Evaluation 10/03/16   Authorization Type [employer] Medicare    Authorization Time Period 09/03/16-11/03/16   Authorization - Visit Number 3   Authorization - Number of Visits 10   PT Start Time 1115   PT Stop Time 1155   PT Time Calculation (min) 40 min   Activity Tolerance Patient tolerated treatment well   Behavior During Therapy [facility] for tasks assessed/performed      Past Medical History:  Diagnosis Date  . Anxiety    takes xanax  . Arthritis    fingers and neck  . Cataract immature   . Elevated hemoglobin A1c March 2016   level 6.2  . GERD (gastroesophageal reflux disease)   . Headache(784.0)    migraines  . History of hiatal hernia   . Hypertension   . IBS (irritable bowel syndrome)   . Kidney stones   . Melanoma ([place])    left eye  . Osteopenia 2016  . PONV (postoperative nausea and vomiting)     Past Surgical History:  Procedure Laterality Date  . ABDOMINAL HYSTERECTOMY  1978   partial  . BILATERAL SALPINGOOPHORECTOMY  2010   with robot  . CHOLECYSTECTOMY  2005   lap  . CYSTOSCOPY N/A 03/13/2015   Procedure: CYSTOSCOPY;  Surgeon: Nunzio Cobbs, MD;  Location: [facility];  Service: Gynecology;  Laterality: N/A;  . LITHOTRIPSY     20 years ago  . ROBOTIC ASSISTED SALPINGO OOPHERECTOMY Left 03/13/2015   Procedure: ROBOTIC ASSISTED INCISION OF  LEFT PELVIC MASS, LYSIS OF ADHESIONS  WITH PELVIC WASHINGS;  Surgeon: Nunzio Cobbs, MD;  Location: [facility];  Service: Gynecology;  Laterality: Left;  . TONSILLECTOMY     as child  . TUBAL LIGATION  1976     There were no vitals filed for this visit.      Subjective Assessment - 09/15/16 1119    Subjective Pt reports she had a rough week with lots of pain in her anterior left hip which limited her mobility all weekend. She was able to do parts of her HEP buyt not all of them.    Pertinent History Denies surgical issues on that knee; 9/10 at worse (night); 1/10 at best; particularly bad in mornings, but is limited to sleeping in a recliner.    Currently in Pain? No/denies                         [facility] Adult PT Treatment/Exercise - 09/15/16 0001      Transfers   Number of Reps Other reps (comment)  30sec chair rise: 15x     Knee/Hip Exercises: Stretches   Other Knee/Hip Stretches FABER Stretch Supine: 2x60s bil (low tension)   FADIR Stretch Supine: 2x30s bil (low tension)      Knee/Hip Exercises: Seated   Marching Limitations 2x10 bilat   Marching Weights 5 lbs.     Knee/Hip Exercises: Supine   Bridges 2 sets;10 reps   Other Supine Knee/Hip Exercises Quad'ped: Hip Ext 1x10  Quad'ped: Fire Hyrdrants 1x10   Other Supine Knee/Hip Exercises Hip Abduction heel slides 2" step, 5# weight: 2x10                  PT Short Term Goals - 09/03/16 2133      PT SHORT TERM GOAL #1   Title After 2 weeks pt will demonstrate independence in HEP with items availabel for pain management.    Status New     PT SHORT TERM GOAL #2   Title After 4 weeks pt will demonstrate improved global strength in BLE with 30s Chair Rise>14x.    Status New     PT SHORT TERM GOAL #3   Title After 4 weeks pt will demonstrate improved improved hip strength/balance with SLS>10s bilat.      PT SHORT TERM GOAL #4   Title After 4 weeks pt will demonstrate improved fascial restrictions within the bilat gluteal muscularture tolerating deep palaption with pain <2/10.    Status New           PT Long Term Goals - 09/03/16 2140      PT LONG TERM GOAL #1   Title After 7 weeks pt will  demonstrate independence in HEP with items available for continued progress s/p DC.    Status New     PT LONG TERM GOAL #2   Title After 7 weeks pt will demonstrate improved global strength in BLE with 30s Chair Rise>17x.   Status New     PT LONG TERM GOAL #3   Title After 7 weeks pt will demonstrate improved improved hip strength/balance with SLS>30s bilat.    Status New     PT LONG TERM GOAL #4   Title After 7 weeks pt will demonstrate improved fascial restrictions within the bilat gluteal muscularture tolerating deep palaption without pain.                Plan - 09/15/16 1146    Clinical Impression Statement Pt comes to session complaining of agrgavation fo pain after previosu session which she believes to be GI related, but plapation of LLQ remains quite uncomfortable while trying to assess involvement of psoas. Attempt to perform activirties in quadruped today results in some aggravation of left knee and bilat shoulders after multiple sets. This session progressed hip strengtyhening measures, with noted instability with fire hydrants, but very good strength in supine band clams, hence should consider moving these to sidelying with weights next session. Pt tolerates sewsion well, but manually pulls paniculus away from hip to avoid aggravation. Pt encouraged to keep her GI follow-up on friday should symptoms improve.    Rehab Potential Good   Clinical Impairments Affecting Rehab Potential Chonicity of complaint   PT Frequency 2x / week   PT Treatment/Interventions Electrical Stimulation;Moist Heat;Gait training;Stair training;Functional mobility training;Therapeutic activities;Therapeutic exercise;Patient/family education;Neuromuscular re-education;Balance training;Dry needling;Passive range of motion;Manual techniques   PT Next Visit Plan sidelying clams c weight; reverse clams, standing hip extension.    PT Home Exercise Plan 9/12: Corky Sox stretch, Fadir Stretch, Bridging, Piriformis  Stretch (seated);    Consulted and Agree with Plan of Care Patient      Patient will benefit from skilled therapeutic intervention in order to improve the following deficits and impairments:  Decreased activity tolerance, Decreased balance, Difficulty walking, Increased edema, Obesity, Decreased strength, Increased muscle spasms, Postural dysfunction, Improper body mechanics, Pain, Increased fascial restricitons  Visit Diagnosis: Other muscle spasm  Muscle weakness (generalized)  Unsteadiness on feet  Problem List Patient Active Problem List   Diagnosis Date Noted  . OTHER DISEASES OF VOCAL CORDS 01/01/2009  . GERD 01/01/2009  . COUGH 01/01/2009    12:04 PM, 09/15/16 Etta Grandchild, PT, DPT Physical Therapist at [facility] [phone] (office)     [place] [address] [place], [place], [address] Phone: [phone]   Fax:  [phone]  Name: Victoria Andrade MRN: [id number] Date of Birth: 1946-12-25

## 2016-09-18 ENCOUNTER — Ambulatory Visit (HOSPITAL_COMMUNITY): Payer: Medicare Other

## 2016-09-18 ENCOUNTER — Encounter (HOSPITAL_COMMUNITY): Payer: Self-pay

## 2016-09-18 DIAGNOSIS — M62838 Other muscle spasm: Secondary | ICD-10-CM

## 2016-09-18 DIAGNOSIS — M6281 Muscle weakness (generalized): Secondary | ICD-10-CM

## 2016-09-18 DIAGNOSIS — R2681 Unsteadiness on feet: Secondary | ICD-10-CM

## 2016-09-18 NOTE — Therapy (Signed)
[place], [place], [address] Phone: [phone]   Fax:  [phone]  Physical Therapy Treatment  Patient Details  Name: Victoria Andrade MRN: [id number] Date of Birth: May 19, 1946 Referring Provider: Molli Barrows   Encounter Date: 09/18/2016      PT End of Session - 09/18/16 1201    Visit Number 4   Number of Visits 16   Date for PT Re-Evaluation 10/03/16   Authorization Type UHC Medicare    Authorization Time Period 09/03/16-11/03/16   Authorization - Visit Number 4   Authorization - Number of Visits 10   PT Start Time [id number]   PT Stop Time 1159   PT Time Calculation (min) 42 min   Activity Tolerance Patient tolerated treatment well;Patient limited by fatigue;No increased pain   Behavior During Therapy [facility] for tasks assessed/performed      Past Medical History:  Diagnosis Date  . Anxiety    takes xanax  . Arthritis    fingers and neck  . Cataract immature   . Elevated hemoglobin A1c March 2016   level 6.2  . GERD (gastroesophageal reflux disease)   . Headache(784.0)    migraines  . History of hiatal hernia   . Hypertension   . IBS (irritable bowel syndrome)   . Kidney stones   . Melanoma ([place])    left eye  . Osteopenia 2016  . PONV (postoperative nausea and vomiting)     Past Surgical History:  Procedure Laterality Date  . ABDOMINAL HYSTERECTOMY  1978   partial  . BILATERAL SALPINGOOPHORECTOMY  2010   with robot  . CHOLECYSTECTOMY  2005   lap  . CYSTOSCOPY N/A 03/13/2015   Procedure: CYSTOSCOPY;  Surgeon: Nunzio Cobbs, MD;  Location: [facility];  Service: Gynecology;  Laterality: N/A;  . LITHOTRIPSY     20 years ago  . ROBOTIC ASSISTED SALPINGO OOPHERECTOMY Left 03/13/2015   Procedure: ROBOTIC ASSISTED INCISION OF  LEFT PELVIC MASS, LYSIS OF ADHESIONS  WITH PELVIC WASHINGS;  Surgeon: Nunzio Cobbs, MD;  Location: [facility];  Service: Gynecology;  Laterality: Left;  .  TONSILLECTOMY     as child  . TUBAL LIGATION  1976    There were no vitals filed for this visit.      Subjective Assessment - 09/18/16 1119    Subjective Pt reports she is doing alright today. Her LLQ pain continues but she canceled her GI appointment fearful that she may contract a bug in the waiting room.  (she has an upcoming cattaracts surgery scheduled).     Currently in Pain? Yes   Pain Score 2    Pain Location Shoulder   Pain Orientation Left                         [facility] Adult PT Treatment/Exercise - 09/18/16 0001      Therapeutic Activites    Therapeutic Activities Other Therapeutic Activities  Tandem stance, 4" between feet 4x15s alt bilat     Knee/Hip Exercises: Standing   Lateral Step Up Both;2 sets  2x8 bilat 8"box + 2" foam    Other Standing Knee Exercises Full kneel to half kneel, 4x bilat alternating     Knee/Hip Exercises: Seated   Abd/Adduction Weights --  foot sitting to tall kneeing 1x15   Sit to Sand 2 sets;10 reps;without UE support  18" riser/box     Knee/Hip Exercises: Sidelying  Other Sidelying Knee/Hip Exercises Left hip circles 20x CW, 20xCCW  (Limit to 15x next session.    Other Sidelying Knee/Hip Exercises Left Reverse Clams                  PT Short Term Goals - 09/18/16 1204      PT SHORT TERM GOAL #1   Title After 2 weeks pt will demonstrate independence in HEP with items availabel for pain management.    Status Achieved     PT SHORT TERM GOAL #2   Title After 4 weeks pt will demonstrate improved global strength in BLE with 30s Chair Rise>14x.    Baseline 09/04/16: 15x   Status Achieved           PT Long Term Goals - 09/03/16 2140      PT LONG TERM GOAL #1   Title After 7 weeks pt will demonstrate independence in HEP with items available for continued progress s/p DC.    Status New     PT LONG TERM GOAL #2   Title After 7 weeks pt will demonstrate improved global strength in BLE with 30s Chair  Rise>17x.   Status New     PT LONG TERM GOAL #3   Title After 7 weeks pt will demonstrate improved improved hip strength/balance with SLS>30s bilat.    Status New     PT LONG TERM GOAL #4   Title After 7 weeks pt will demonstrate improved fascial restrictions within the bilat gluteal muscularture tolerating deep palaption without pain.                Plan - 09/18/16 1201    Clinical Impression Statement Session focus moved from mobility work to aggressive glute med/max strengthening and functional core stability and strength to improve independence with floor to standing transfers. Care is taken to avoid aggracation of Left shoulder pain (easily exacerbated, as well as knee pain (conditionallyaggravated in kneeling). Pt making progress toward goals.    Rehab Potential Good   Clinical Impairments Affecting Rehab Potential Chonicity of complaint   PT Frequency 2x / week   PT Duration 8 weeks   PT Treatment/Interventions Electrical Stimulation;Moist Heat;Gait training;Stair training;Functional mobility training;Therapeutic activities;Therapeutic exercise;Patient/family education;Neuromuscular re-education;Balance training;Dry needling;Passive range of motion;Manual techniques   PT Next Visit Plan Repeat last session, consider attempting a lower lunge in //bars   PT Home Exercise Plan 9/12: Corky Sox stretch, Fadir Stretch, Bridging, Piriformis Stretch (seated);    Consulted and Agree with Plan of Care Patient      Patient will benefit from skilled therapeutic intervention in order to improve the following deficits and impairments:  Decreased activity tolerance, Decreased balance, Difficulty walking, Increased edema, Obesity, Decreased strength, Increased muscle spasms, Postural dysfunction, Improper body mechanics, Pain, Increased fascial restricitons  Visit Diagnosis: Other muscle spasm  Muscle weakness (generalized)  Unsteadiness on feet     Problem List Patient Active Problem  List   Diagnosis Date Noted  . OTHER DISEASES OF VOCAL CORDS 01/01/2009  . GERD 01/01/2009  . COUGH 01/01/2009    12:06 PM, 09/18/16 Victoria Andrade, PT, DPT Physical Therapist at [facility] [phone] (office)     [place] [address] [place], [place], [address] Phone: [phone]   Fax:  [phone]  Name: Victoria Andrade MRN: [id number] Date of Birth: [birth year]

## 2016-09-22 ENCOUNTER — Ambulatory Visit (HOSPITAL_COMMUNITY): Payer: Medicare Other

## 2016-09-22 ENCOUNTER — Telehealth (HOSPITAL_COMMUNITY): Payer: Self-pay

## 2016-09-22 DIAGNOSIS — R2681 Unsteadiness on feet: Secondary | ICD-10-CM

## 2016-09-22 DIAGNOSIS — M6281 Muscle weakness (generalized): Secondary | ICD-10-CM

## 2016-09-22 DIAGNOSIS — M62838 Other muscle spasm: Secondary | ICD-10-CM | POA: Diagnosis not present

## 2016-09-22 NOTE — Therapy (Signed)
[place], [place], [address] Phone: [phone]   Fax:  [phone]  Physical Therapy Treatment  Patient Details  Name: Victoria Andrade MRN: [id number] Date of Birth: 1946-06-16 Referring Provider: Molli Barrows   Encounter Date: 09/22/2016      PT End of Session - 09/22/16 1208    Visit Number 5   Number of Visits 16   Date for PT Re-Evaluation 10/03/16   Authorization Type UHC Medicare    Authorization Time Period 09/03/16-11/03/16   Authorization - Visit Number 5   Authorization - Number of Visits 10   PT Start Time 1120   PT Stop Time 1201   PT Time Calculation (min) 41 min   Activity Tolerance Patient tolerated treatment well;Patient limited by fatigue;No increased pain   Behavior During Therapy [facility] for tasks assessed/performed      Past Medical History:  Diagnosis Date  . Anxiety    takes xanax  . Arthritis    fingers and neck  . Cataract immature   . Elevated hemoglobin A1c March 2016   level 6.2  . GERD (gastroesophageal reflux disease)   . Headache(784.0)    migraines  . History of hiatal hernia   . Hypertension   . IBS (irritable bowel syndrome)   . Kidney stones   . Melanoma ([place])    left eye  . Osteopenia 2016  . PONV (postoperative nausea and vomiting)     Past Surgical History:  Procedure Laterality Date  . ABDOMINAL HYSTERECTOMY  1978   partial  . BILATERAL SALPINGOOPHORECTOMY  2010   with robot  . CHOLECYSTECTOMY  2005   lap  . CYSTOSCOPY N/A 03/13/2015   Procedure: CYSTOSCOPY;  Surgeon: Nunzio Cobbs, MD;  Location: [facility];  Service: Gynecology;  Laterality: N/A;  . LITHOTRIPSY     20 years ago  . ROBOTIC ASSISTED SALPINGO OOPHERECTOMY Left 03/13/2015   Procedure: ROBOTIC ASSISTED INCISION OF  LEFT PELVIC MASS, LYSIS OF ADHESIONS  WITH PELVIC WASHINGS;  Surgeon: Nunzio Cobbs, MD;  Location: [facility];  Service: Gynecology;  Laterality: Left;  .  TONSILLECTOMY     as child  . TUBAL LIGATION  1976    There were no vitals filed for this visit.      Subjective Assessment - 09/22/16 1125    Subjective Pt reports she continues to feel about the same. She reports her Left hip feels about the same. She continues to have deep pain in her Left hip. Stretches are going ok. She is generally sore from additional housework over th eweekend with some neck and shoulder pain.    Currently in Pain? Yes   Pain Score 2    Pain Location Hip   Pain Orientation Left   Pain Descriptors / Indicators Aching;Dull   Pain Onset More than a month ago   Pain Frequency Constant                         [facility] Adult PT Treatment/Exercise - 09/22/16 0001      Therapeutic Activites    Therapeutic Activities Other Therapeutic Activities  Tandem stance, 4" between feet 4x15s alt bilat     Exercises   Exercises Knee/Hip     Knee/Hip Exercises: Standing   Lateral Step Up Both;2 sets  2x8 bilat 8"box + 2" foam      Knee/Hip Exercises: Sidelying   Hip ABduction Left;Strengthening  eccentrics, 5# 2x8   Other Sidelying Knee/Hip Exercises Left hip circles 20x CW, 20xCCW  (Limit to 15x next session.    Other Sidelying Knee/Hip Exercises Left Reverse Clams: 2x8  Left manual Hip Extension stretch 2x30sec     Modalities   Modalities Moist Heat     Moist Heat Therapy   Number Minutes Moist Heat 15 Minutes  used concurrently with exercises for pain management and FMR     Manual Therapy   Manual Therapy Myofascial release   Myofascial Release MFR and Active Release: 15 minutes  crainial glute med, glute max, tfl, glute minimus                  PT Short Term Goals - 09/18/16 1204      PT SHORT TERM GOAL #1   Title After 2 weeks pt will demonstrate independence in HEP with items availabel for pain management.    Status Achieved     PT SHORT TERM GOAL #2   Title After 4 weeks pt will demonstrate improved global strength in  BLE with 30s Chair Rise>14x.    Baseline 09/04/16: 15x   Status Achieved           PT Long Term Goals - 09/03/16 2140      PT LONG TERM GOAL #1   Title After 7 weeks pt will demonstrate independence in HEP with items available for continued progress s/p DC.    Status New     PT LONG TERM GOAL #2   Title After 7 weeks pt will demonstrate improved global strength in BLE with 30s Chair Rise>17x.   Status New     PT LONG TERM GOAL #3   Title After 7 weeks pt will demonstrate improved improved hip strength/balance with SLS>30s bilat.    Status New     PT LONG TERM GOAL #4   Title After 7 weeks pt will demonstrate improved fascial restrictions within the bilat gluteal muscularture tolerating deep palaption without pain.                Plan - 09/22/16 1210    Clinical Impression Statement Pt From patient report at beginning of session, it is difficult to qualify much improvement in CC. The patient is is unable to provide sufficicent detail regarding aggravating factors at this time. Return to assessment of soft tissue structures at the Left hip and pelvis demonstrates tightness in the TFL, 2 gluteus muscles, and Lateral Rectus. MFR is continued in this area today both static and with physiological motion of the hip joint. The patient reports that all of this tissue feels tight, but is unable to say for certain that any of them are causing her pain related to her CC. Strengthening is progressed and target at the Abduction and internal rotation function of glute med. Pt tolerating session well, but overall gives very little feedback throughout session.       Rehab Potential Good   Clinical Impairments Affecting Rehab Potential Chonicity of complaint   PT Frequency 2x / week   PT Duration 8 weeks   PT Treatment/Interventions Electrical Stimulation;Moist Heat;Gait training;Stair training;Functional mobility training;Therapeutic activities;Therapeutic exercise;Patient/family  education;Neuromuscular re-education;Balance training;Dry needling;Passive range of motion;Manual techniques   PT Next Visit Plan Continue with manual as needed. Continue with this specific regimen of glut emed strengthening. Add circles to hip program next visit.    PT Home Exercise Plan 9/12: Corky Sox stretch, Fadir Stretch, Bridging, Piriformis Stretch (seated);    Consulted and  Agree with Plan of Care Patient      Patient will benefit from skilled therapeutic intervention in order to improve the following deficits and impairments:  Decreased activity tolerance, Decreased balance, Difficulty walking, Increased edema, Obesity, Decreased strength, Increased muscle spasms, Postural dysfunction, Improper body mechanics, Pain, Increased fascial restricitons  Visit Diagnosis: Other muscle spasm  Muscle weakness (generalized)  Unsteadiness on feet     Problem List Patient Active Problem List   Diagnosis Date Noted  . OTHER DISEASES OF VOCAL CORDS 01/01/2009  . GERD 01/01/2009  . COUGH 01/01/2009    12:26 PM, 09/22/16 Etta Grandchild, PT, DPT Physical Therapist at [facility] [phone] (office)    [place] [address] [place], [place], [address] Phone: [phone]   Fax:  [phone]  Name: Victoria Andrade MRN: [id number] Date of Birth: 1946/08/14

## 2016-09-22 NOTE — Telephone Encounter (Signed)
She will have eye surgery and can not come on Thursday

## 2016-09-25 ENCOUNTER — Encounter (HOSPITAL_COMMUNITY): Payer: Medicare Other

## 2016-09-26 ENCOUNTER — Telehealth (HOSPITAL_COMMUNITY): Payer: Self-pay

## 2016-09-26 NOTE — Telephone Encounter (Signed)
called pt - cx applment due to [place] having to do acute care for [facility] D.

## 2016-09-29 ENCOUNTER — Ambulatory Visit (HOSPITAL_COMMUNITY): Payer: Medicare Other

## 2016-10-02 ENCOUNTER — Ambulatory Visit (HOSPITAL_COMMUNITY): Payer: Medicare Other | Attending: Physician Assistant | Admitting: Physical Therapy

## 2016-10-02 DIAGNOSIS — R2681 Unsteadiness on feet: Secondary | ICD-10-CM | POA: Diagnosis present

## 2016-10-02 DIAGNOSIS — M6281 Muscle weakness (generalized): Secondary | ICD-10-CM

## 2016-10-02 DIAGNOSIS — M62838 Other muscle spasm: Secondary | ICD-10-CM

## 2016-10-02 NOTE — Therapy (Signed)
[place], [place], [address] Phone: [phone]   Fax:  [phone]  Physical Therapy Treatment  Patient Details  Name: Victoria Andrade MRN: [id number] Date of Birth: Feb 07, 1946 Referring Provider: Molli Barrows   Encounter Date: 10/02/2016      PT End of Session - 10/02/16 1024    Visit Number 6   Number of Visits 16   Date for PT Re-Evaluation 10/03/16   Authorization Type U[employer] Medicare    Authorization Time Period 09/03/16-11/03/16   Authorization - Visit Number 6   Authorization - Number of Visits 10   PT Start Time 0908   PT Stop Time 0950   PT Time Calculation (min) 42 min   Activity Tolerance Patient tolerated treatment well;Patient limited by fatigue;No increased pain   Behavior During Therapy WFL for tasks assessed/performed      Past Medical History:  Diagnosis Date  . Anxiety    takes xanax  . Arthritis    fingers and neck  . Cataract immature   . Elevated hemoglobin A1c March 2016   level 6.2  . GERD (gastroesophageal reflux disease)   . Headache(784.0)    migraines  . History of hiatal hernia   . Hypertension   . IBS (irritable bowel syndrome)   . Kidney stones   . Melanoma ([place])    left eye  . Osteopenia 2016  . PONV (postoperative nausea and vomiting)     Past Surgical History:  Procedure Laterality Date  . ABDOMINAL HYSTERECTOMY  1978   partial  . BILATERAL SALPINGOOPHORECTOMY  2010   with robot  . CHOLECYSTECTOMY  2005   lap  . CYSTOSCOPY N/A 03/13/2015   Procedure: CYSTOSCOPY;  Surgeon: Nunzio Cobbs, MD;  Location: [facility];  Service: Gynecology;  Laterality: N/A;  . LITHOTRIPSY     20 years ago  . ROBOTIC ASSISTED SALPINGO OOPHERECTOMY Left 03/13/2015   Procedure: ROBOTIC ASSISTED INCISION OF  LEFT PELVIC MASS, LYSIS OF ADHESIONS  WITH PELVIC WASHINGS;  Surgeon: Nunzio Cobbs, MD;  Location: [facility];  Service: Gynecology;  Laterality: Left;  .  TONSILLECTOMY     as child  . TUBAL LIGATION  1976    There were no vitals filed for this visit.      Subjective Assessment - 10/02/16 0910    Subjective Pt states it was feeling better then the pain returned after [place]  States she walked a couple hours and then standing a couple hours.  Rates pain 1/10 in Lt hip and down into lateral proximal LE   Currently in Pain? Yes   Pain Score 1    Pain Location Hip   Pain Orientation Left   Pain Descriptors / Indicators Aching;Dull   Pain Type Chronic pain                         [facility] Adult PT Treatment/Exercise - 10/02/16 0001      Knee/Hip Exercises: Stretches   Active Hamstring Stretch Both;2 reps;20 seconds   Active Hamstring Stretch Limitations 12" box   Hip Flexor Stretch Both;2 reps;20 seconds   Hip Flexor Stretch Limitations 12" box    Other Knee/Hip Stretches standing groin stretch 12" box     Knee/Hip Exercises: Standing   Hip Abduction Both;10 reps   Hip Extension Both;10 reps   Lateral Step Up Both;2 sets;10 reps   Lateral  Step Up Limitations 6" box eccentric control focus   Other Standing Knee Exercises tandem stance 2X30" each LE lead no UE assist     Knee/Hip Exercises: Seated   Sit to Sand 20 reps     Knee/Hip Exercises: Supine   Bridges 15 reps     Knee/Hip Exercises: Sidelying   Other Sidelying Knee/Hip Exercises Left hip circles 20x CW, 20.0xCCW   Other Sidelying Knee/Hip Exercises Left  Clams: 2x10     Manual Therapy   Manual Therapy Myofascial release   Manual therapy comments done seperately from all other skilled interventions   Myofascial Release to Lt gluteal musculature in Rt sidelying                  PT Short Term Goals - 09/18/16 1204      PT SHORT TERM GOAL #1   Title After 2 weeks pt will demonstrate independence in HEP with items availabel for pain management.    Status Achieved     PT SHORT TERM GOAL #2   Title After 4 weeks  pt will demonstrate improved global strength in BLE with 30s Chair Rise>14x.    Baseline 09/04/16: 15x   Status Achieved           PT Long Term Goals - 09/03/16 2140      PT LONG TERM GOAL #1   Title After 7 weeks pt will demonstrate independence in HEP with items available for continued progress s/p DC.    Status New     PT LONG TERM GOAL #2   Title After 7 weeks pt will demonstrate improved global strength in BLE with 30s Chair Rise>17x.   Status New     PT LONG TERM GOAL #3   Title After 7 weeks pt will demonstrate improved improved hip strength/balance with SLS>30s bilat.    Status New     PT LONG TERM GOAL #4   Title After 7 weeks pt will demonstrate improved fascial restrictions within the bilat gluteal muscularture tolerating deep palaption without pain.                Plan - 10/02/16 1024    Clinical Impression Statement Contiued with focus on improving pain and overall mobility of the surrounding tissues.  Progressed with hip strengthening as well this session adding standing hip abduction and extension and hip flexor strength to target tight anterior hip musculature.  Pt with improving LE strength with good eccentric control.  Manual completed with most tightness in Lt lumbar around to anterior hip to knee level.  Able to decrease adhesions however patient reported increased discomfort immediately following session.  Recommended pateint try ice to area to see which modality works better.  Suggested using prior to bed to help sleep through the night.    Rehab Potential Good   Clinical Impairments Affecting Rehab Potential Chonicity of complaint   PT Frequency 2x / week   PT Duration 8 weeks   PT Treatment/Interventions Electrical Stimulation;Moist Heat;Gait training;Stair training;Functional mobility training;Therapeutic activities;Therapeutic exercise;Patient/family education;Neuromuscular re-education;Balance training;Dry needling;Passive range of motion;Manual  techniques   PT Next Visit Plan Continue with manual as needed. Continue with this specific regimen of glut emed strengthening. Inquire on use of ice vs heat next session.   PT Home Exercise Plan 9/12: Corky Sox stretch, Fadir Stretch, Bridging, Piriformis Stretch (seated);    Consulted and Agree with Plan of Care Patient      Patient will benefit from skilled therapeutic intervention in order to  improve the following deficits and impairments:  Decreased activity tolerance, Decreased balance, Difficulty walking, Increased edema, Obesity, Decreased strength, Increased muscle spasms, Postural dysfunction, Improper body mechanics, Pain, Increased fascial restricitons  Visit Diagnosis: Other muscle spasm  Muscle weakness (generalized)  Unsteadiness on feet     Problem List Patient Active Problem List   Diagnosis Date Noted  . OTHER DISEASES OF VOCAL CORDS 01/01/2009  . GERD 01/01/2009  . COUGH 01/01/2009    Teena Irani, PTA/CLT [phone]  10/02/2016, 10:31 AM  [facility] [address] [place], [place], [address] Phone: [phone]   Fax:  [phone]  Name: Victoria Andrade MRN: [id number] Date of Birth: 12/26/1946

## 2016-10-06 ENCOUNTER — Ambulatory Visit (HOSPITAL_COMMUNITY): Payer: Medicare Other

## 2016-10-06 DIAGNOSIS — R2681 Unsteadiness on feet: Secondary | ICD-10-CM

## 2016-10-06 DIAGNOSIS — M62838 Other muscle spasm: Secondary | ICD-10-CM | POA: Diagnosis not present

## 2016-10-06 DIAGNOSIS — M6281 Muscle weakness (generalized): Secondary | ICD-10-CM

## 2016-10-06 NOTE — Therapy (Signed)
[place], [place], [address] Phone: [phone]   Fax:  [phone]  Physical Therapy Treatment  Patient Details  Name: Victoria Andrade MRN: [id number] Date of Birth: Jan 12, 1946 Referring Provider: Molli Barrows   Encounter Date: 10/06/2016      PT End of Session - 10/06/16 1219    Visit Number 7   Number of Visits 16   Date for PT Re-Evaluation 10/03/16   Authorization Type UHC Medicare    Authorization Time Period 09/03/16-11/03/16   Authorization - Visit Number 7   Authorization - Number of Visits 10   PT Start Time [id number]  Pt arrived late.   PT Stop Time 1200   PT Time Calculation (min) 36 min   Activity Tolerance Patient tolerated treatment well;Patient limited by fatigue   Behavior During Therapy [facility] for tasks assessed/performed      Past Medical History:  Diagnosis Date  . Anxiety    takes xanax  . Arthritis    fingers and neck  . Cataract immature   . Elevated hemoglobin A1c March 2016   level 6.2  . GERD (gastroesophageal reflux disease)   . Headache(784.0)    migraines  . History of hiatal hernia   . Hypertension   . IBS (irritable bowel syndrome)   . Kidney stones   . Melanoma ([place])    left eye  . Osteopenia 2016  . PONV (postoperative nausea and vomiting)     Past Surgical History:  Procedure Laterality Date  . ABDOMINAL HYSTERECTOMY  1978   partial  . BILATERAL SALPINGOOPHORECTOMY  2010   with robot  . CHOLECYSTECTOMY  2005   lap  . CYSTOSCOPY N/A 03/13/2015   Procedure: CYSTOSCOPY;  Surgeon: Nunzio Cobbs, MD;  Location: [facility];  Service: Gynecology;  Laterality: N/A;  . LITHOTRIPSY     20 years ago  . ROBOTIC ASSISTED SALPINGO OOPHERECTOMY Left 03/13/2015   Procedure: ROBOTIC ASSISTED INCISION OF  LEFT PELVIC MASS, LYSIS OF ADHESIONS  WITH PELVIC WASHINGS;  Surgeon: Nunzio Cobbs, MD;  Location: [facility];  Service: Gynecology;  Laterality: Left;  .  TONSILLECTOMY     as child  . TUBAL LIGATION  1976    There were no vitals filed for this visit.      Subjective Assessment - 10/06/16 1126    Subjective Pt reports she is doing alright today. She continues to have good/bad daysintermittently but no real change in the hip noted. She has been having some difficulty with any of the HEP activities that aggravate her stomach which remains painful.    Pertinent History Denies surgical issues on that knee; 9/10 at worse (night); 1/10 at best; particularly bad in mornings, but is limited to sleeping in a recliner.                          [facility] Adult PT Treatment/Exercise - 10/06/16 0001      Knee/Hip Exercises: Stretches   Other Knee/Hip Stretches Supine Psoas Stretch bilat 1x3 sminutes     Knee/Hip Exercises: Sidelying   Hip ABduction 2 sets;Both;10 reps;AAROM   Other Sidelying Knee/Hip Exercises Left Reverse Clams: 2x10 AAROM     Manual Therapy   Manual Therapy Myofascial release   Myofascial Release MFR and Active Release: 15 minutes  bilat glute med, glute minimus  PT Education - 10/06/16 1217    Education provided Yes   Education Details Given education on how knots in gluteal muscles change the contractile function of the muscles and the role of MFR to resolve them to improve activation. Also disucssed the role of strenghtening going forward to improve overall funcitonal and reduce pain long-term.    Person(s) Educated Patient   Methods Explanation;Demonstration   Comprehension Verbalized understanding          PT Short Term Goals - 09/18/16 1204      PT SHORT TERM GOAL #1   Title After 2 weeks pt will demonstrate independence in HEP with items availabel for pain management.    Status Achieved     PT SHORT TERM GOAL #2   Title After 4 weeks pt will demonstrate improved global strength in BLE with 30s Chair Rise>14x.    Baseline 09/04/16: 15x   Status Achieved           PT  Long Term Goals - 09/03/16 2140      PT LONG TERM GOAL #1   Title After 7 weeks pt will demonstrate independence in HEP with items available for continued progress s/p DC.    Status New     PT LONG TERM GOAL #2   Title After 7 weeks pt will demonstrate improved global strength in BLE with 30s Chair Rise>17x.   Status New     PT LONG TERM GOAL #3   Title After 7 weeks pt will demonstrate improved improved hip strength/balance with SLS>30s bilat.    Status New     PT LONG TERM GOAL #4   Title After 7 weeks pt will demonstrate improved fascial restrictions within the bilat gluteal muscularture tolerating deep palaption without pain.                Plan - 10/06/16 1220    Clinical Impression Statement Pt continues to demonstrate limited progress, compciated by continued lower GI pain, which also limited her ability to perform HEP consistently. Manual therapy is used again to address tightness in bilat glute min/med, followed up by stretching. Pt does not offer very much feedback from soft tissue work,, in spite of frequent questioning of patient, which makes it difficult to ascertain the subjective effects,however, muscles are more supple and ROM improves in hips afterward. Glute med remains somewhat weak on the Rt side, and very weak on the Lt side, hence AAROM is used during strengthening to asure proper form and avoid compensation patterns.  At end of session, pt gives some feedback as to hips feelng more mobile, but otherwise does not given any more details.    Rehab Potential Good   Clinical Impairments Affecting Rehab Potential Chonicity of complaint   PT Frequency 2x / week   PT Duration 8 weeks   PT Treatment/Interventions Electrical Stimulation;Moist Heat;Gait training;Stair training;Functional mobility training;Therapeutic activities;Therapeutic exercise;Patient/family education;Neuromuscular re-education;Balance training;Dry needling;Passive range of motion;Manual techniques    PT Next Visit Plan Next Session Reassessment: add in seated hip IR AROM, and standing hip abduction heel on wall. Continue with manual as needed to address tightness in peritrochanteric gluteals. Continue with this specific regimen of glute med strengthening.   PT Home Exercise Plan 9/12: Corky Sox stretch, Fadir Stretch, Bridging, Piriformis Stretch (seated);    Consulted and Agree with Plan of Care Patient      Patient will benefit from skilled therapeutic intervention in order to improve the following deficits and impairments:  Decreased activity tolerance, Decreased  balance, Difficulty walking, Increased edema, Obesity, Decreased strength, Increased muscle spasms, Postural dysfunction, Improper body mechanics, Pain, Increased fascial restricitons  Visit Diagnosis: Other muscle spasm  Muscle weakness (generalized)  Unsteadiness on feet     Problem List Patient Active Problem List   Diagnosis Date Noted  . OTHER DISEASES OF VOCAL CORDS 01/01/2009  . GERD 01/01/2009  . COUGH 01/01/2009    12:33 PM, 10/06/16 Etta Grandchild, PT, DPT Physical Therapist at [facility] [phone] (office)     [facility] [address] [place], [place], [address] Phone: [phone]   Fax:  [phone]  Name: Victoria Andrade MRN: [id number] Date of Birth: 12-Sep-1946

## 2016-10-09 ENCOUNTER — Ambulatory Visit (INDEPENDENT_AMBULATORY_CARE_PROVIDER_SITE_OTHER): Payer: Medicare Other | Admitting: Obstetrics and Gynecology

## 2016-10-09 ENCOUNTER — Encounter: Payer: Self-pay | Admitting: Obstetrics and Gynecology

## 2016-10-09 VITALS — BP 140/70 | HR 80 | Resp 16 | Ht 61.5 in | Wt 188.0 lb

## 2016-10-09 DIAGNOSIS — F411 Generalized anxiety disorder: Secondary | ICD-10-CM | POA: Diagnosis not present

## 2016-10-09 DIAGNOSIS — R1032 Left lower quadrant pain: Secondary | ICD-10-CM

## 2016-10-09 DIAGNOSIS — Z79899 Other long term (current) drug therapy: Secondary | ICD-10-CM

## 2016-10-09 DIAGNOSIS — Z1211 Encounter for screening for malignant neoplasm of colon: Secondary | ICD-10-CM

## 2016-10-09 DIAGNOSIS — Z01419 Encounter for gynecological examination (general) (routine) without abnormal findings: Secondary | ICD-10-CM | POA: Diagnosis not present

## 2016-10-09 MED ORDER — ESTRADIOL 0.5 MG PO TABS: 0.5000 mg | ORAL_TABLET | Freq: Every day | ORAL | 11 refills | Status: DC

## 2016-10-09 NOTE — Progress Notes (Signed)
70 y.o. G9P0011 Married Caucasian female here for annual exam.    LLQ pain for one month.  Felt like a cyst in her ovary. Pain improved with stool softeners/laxatives.  Cannot tolerate pressure or laying on her abdomen.  Pain goes all the way across her abdomen to the upper right when it occurs. No nausea, vomiting, fever.  Bowels are going from loose to constipation due to IBS. Not taking any pain medication for her pain.   Patient remembers that I stated she has scar tissue remaining following her laparoscopic excision of left ovarian remnant done in 2016.  Remaining scar tissue is right sided near the cecum.   Has appt next week with Dr. [name] for hx of renal stones.  Had 2 lithotripsies in past.   Hx TAH - 1978. Hx robotic BSO - 2010. Hx of laparoscopic excision of left ovarian cystadenofibroma, ovarian remnant from prior BSO surgery - 03/13/15.  On oral estrogen, estradiol 0.5 mg at hs.  Feels anxious by mid morning so she takes a Xanax for this in the morning.   Sees PCP on Nov. 3rd for routine exam.   PCP: Sharilyn Sites   Patient's last menstrual period was 12/29/1976 (approximate).           Sexually active: Yes.    The current method of family planning is status post hysterectomy.    Exercising: No.  The patient does not participate in regular exercise at present. Smoker:  no  Health Maintenance: Pap:  2010 neg History of abnormal Pap:  no MMG:  04-11-16 category c density birads 1:neg Colonoscopy:  2014 neg f/y 51yrs.  Patient states she was told now to do this in 10 years and not 5 per her GI. BMD:   04/20/15  Result  Osteopenia:[place]. Took Fosamax in the past & had reflux with this.  Dr. Hilma Favors following.  TDaP:  Up to date with pcp HIV: No Hep C: No Screening Labs:  Hb today: PCP, Urine today: PCP   reports that she has never smoked. She has never used smokeless tobacco. She reports that she drinks about 0.6 oz of alcohol per week . She reports that she  does not use drugs.  Past Medical History:  Diagnosis Date  . Anxiety    takes xanax  . Arthritis    fingers and neck  . Cataract immature   . Elevated hemoglobin A1c March 2016   level 6.2  . GERD (gastroesophageal reflux disease)   . Headache(784.0)    migraines  . History of hiatal hernia   . Hypertension   . IBS (irritable bowel syndrome)   . Kidney stones   . Melanoma ([place])    left eye  . Osteopenia 2016  . PONV (postoperative nausea and vomiting)     Past Surgical History:  Procedure Laterality Date  . ABDOMINAL HYSTERECTOMY  1978   partial  . BILATERAL SALPINGOOPHORECTOMY  2010   with robot  . CHOLECYSTECTOMY  2005   lap  . CYSTOSCOPY N/A 03/13/2015   Procedure: CYSTOSCOPY;  Surgeon: Nunzio Cobbs, MD;  Location: [facility];  Service: Gynecology;  Laterality: N/A;  . LITHOTRIPSY     20 years ago  . ROBOTIC ASSISTED SALPINGO OOPHERECTOMY Left 03/13/2015   Procedure: ROBOTIC ASSISTED INCISION OF  LEFT PELVIC MASS, LYSIS OF ADHESIONS  WITH PELVIC WASHINGS;  Surgeon: Nunzio Cobbs, MD;  Location: [facility];  Service: Gynecology;  Laterality: Left;  . TONSILLECTOMY  as child  . TUBAL LIGATION  1976    Current Outpatient Prescriptions  Medication Sig Dispense Refill  . ALPRAZolam (XANAX) 1 MG tablet Take 1 mg by mouth at bedtime as needed for anxiety or sleep. anxiety    . brimonidine (ALPHAGAN) 0.15 % ophthalmic solution     . Cholecalciferol (VITAMIN D3) 5000 UNITS CAPS Take 5,000 Units by mouth daily.     . diphenhydrAMINE (BENADRYL) 25 MG tablet Take 25 mg by mouth at bedtime as needed for allergies or sleep. sleep    . estradiol (ESTRACE) 0.5 MG tablet Take 1 tablet (0.5 mg total) by mouth daily. 30 tablet 8  . famotidine (PEPCID) 20 MG tablet Take 20 mg by mouth as needed. For indigestion    . furosemide (LASIX) 40 MG tablet Take 40 mg by mouth as needed for fluid. For fluid    . hydrochlorothiazide (HYDRODIURIL) 25 MG tablet Take 25 mg by  mouth. Takes 1 tablet at breakfast and 1 tablet at lunch    . loratadine (CLARITIN) 10 MG tablet Take 10 mg by mouth daily.    . montelukast (SINGULAIR) 10 MG tablet Take 10 mg by mouth at bedtime.    . naratriptan (AMERGE) 2.5 MG tablet Take 2.5 mg by mouth as needed for migraine. Take one (1) tablet at onset of headache; if returns or does not resolve, may repeat after 4 hours; do not exceed five (5) mg in 24 hours.    Marland Kitchen ofloxacin (OCUFLOX) 0.3 % ophthalmic solution     . prednisoLONE acetate (PRED FORTE) 1 % ophthalmic suspension      No current facility-administered medications for this visit.     Family History  Problem Relation Age of Onset  . Adopted: Yes  . Hypertension Mother   . Thyroid disease Mother   . Diabetes Maternal Grandmother   . Hypertension Maternal Grandmother   . Heart attack Maternal Grandmother   . Hypertension Maternal Grandfather   . Stroke Maternal Grandfather   . Stroke Maternal Uncle   . Stroke Maternal Uncle   . Heart attack Maternal Uncle   . Heart attack Maternal Uncle   . Lung cancer Maternal Uncle     ROS:  Pertinent items are noted in HPI.  Otherwise, a comprehensive ROS was negative.  Exam:   BP 140/70 (BP Location: Right Arm, Patient Position: Sitting, Cuff Size: Normal)   Pulse 80   Resp 16   Ht 5' 1.5" (1.562 m)   Wt 188 lb (85.3 kg)   LMP 12/29/1976 (Approximate)   BMI 34.95 kg/m     General appearance: alert, cooperative and appears stated age Head: Normocephalic, without obvious abnormality, atraumatic Neck: no adenopathy, supple, symmetrical, trachea midline and thyroid normal to inspection and palpation Lungs: clear to auscultation bilaterally Breasts: normal appearance, no masses or tenderness, No nipple retraction or dimpling, No nipple discharge or bleeding, No axillary or supraclavicular adenopathy Heart: regular rate and rhythm Abdomen: soft, non-tender; no masses, no organomegaly. Extremities: extremities normal,  atraumatic, no cyanosis or edema Skin: Skin color, texture, turgor normal. No rashes or lesions Lymph nodes: Cervical, supraclavicular, and axillary nodes normal. No abnormal inguinal nodes palpated Neurologic: Grossly normal  Pelvic: External genitalia:  no lesions              Urethra:  normal appearing urethra with no masses, tenderness or lesions              Bartholins and Skenes: normal  Vagina: normal appearing vagina with normal color and discharge, no lesions              Cervix:absent               Pap taken: No. Bimanual Exam:  Uterus:  Absent.              Adnexa: no mass, fullness, tenderness              Rectal exam: Yes.  .  Confirms.              Anus:  normal sphincter tone, no lesions  Chaperone was present for exam.  Assessment:   Well woman visit with normal exam. Status post TAH.  Status post laparoscopic BSO with robotic approach.  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 Osteopenia.  Estrogen therapy patient.  LLQ pain.  Hx IBS, renal stones, adhesive disease.  Another ovarian remnant less likely but not impossible Anxiety. Regular Xanax use.   Plan: Yearly mammogram recommended after age 2.  Recommended self breast exam.  Pap and HR HPV as above. Discussed Calcium, Vitamin D, regular exercise program including cardiovascular and weight bearing exercise. Refill of Estrace 0.5 mg daily in am.  #30, RF 11.  Discussed risks of DVT, PE, stroke, and possible breast cancer.  IFOB to patient.  Surgical reports and pathology report reviewed related to prior ovarian remnant surgery. Follow up with Dr. [name] regarding her abdominal pain.  He likely will proceed with a CT scan in the office which will be a comprehensive intraabdominal and pelvic evaluation of her pain.  She knows to go to the emergency department if the pain becomes significant before then.  I encouraged her to discuss with her PCP antianxiety  medication that is preventative so she can start reducing her Xanax use. Follow up annually and prn.   Additional counseling given.  Yes.  . __15_____ minutes face to face time of which over 50% was spent in counseling regarding LLQ pain and anxiety.  This really was both a problem visit as well as an annual exam.  After visit summary provided.

## 2016-10-09 NOTE — Patient Instructions (Signed)

## 2016-10-10 ENCOUNTER — Ambulatory Visit (HOSPITAL_COMMUNITY): Payer: Medicare Other

## 2016-10-10 DIAGNOSIS — M62838 Other muscle spasm: Secondary | ICD-10-CM | POA: Diagnosis not present

## 2016-10-10 DIAGNOSIS — M6281 Muscle weakness (generalized): Secondary | ICD-10-CM

## 2016-10-10 DIAGNOSIS — R2681 Unsteadiness on feet: Secondary | ICD-10-CM

## 2016-10-10 NOTE — Therapy (Signed)
[place], [place], [address] Phone: [phone]   Fax:  [phone]  Physical Therapy Treatment  Patient Details  Name: Victoria Andrade MRN: [id number] Date of Birth: 21-Aug-1946 Referring Provider: Molli Barrows   Encounter Date: 2016/11/01      PT End of Session - Nov 01, 2016 1227    Visit Number 8   Number of Visits 16   Date for PT Re-Evaluation 11/25/16   Authorization Type [employer] Medicare    Authorization Time Period Sep 25, 2016-11-25-16 (G codes done Nov 01, 2016)   Authorization - Visit Number 8   Authorization - Number of Visits 18   PT Start Time 1116   PT Stop Time 1200   PT Time Calculation (min) 44 min   Activity Tolerance Patient tolerated treatment well;Patient limited by fatigue   Behavior During Therapy [facility] for tasks assessed/performed      Past Medical History:  Diagnosis Date  . Anxiety    takes xanax  . Arthritis    fingers and neck  . Cataract immature   . Elevated hemoglobin A1c March 2016   level 6.2  . GERD (gastroesophageal reflux disease)   . Headache(784.0)    migraines  . History of hiatal hernia   . Hypertension   . IBS (irritable bowel syndrome)   . Kidney stones   . Melanoma ([place])    left eye  . Osteopenia 2016  . PONV (postoperative nausea and vomiting)     Past Surgical History:  Procedure Laterality Date  . ABDOMINAL HYSTERECTOMY  1978   partial  . BILATERAL SALPINGOOPHORECTOMY  2010   with robot  . CHOLECYSTECTOMY  2005   lap  . CYSTOSCOPY N/A 03/13/2015   Procedure: CYSTOSCOPY;  Surgeon: Nunzio Cobbs, MD;  Location: [facility];  Service: Gynecology;  Laterality: N/A;  . LITHOTRIPSY     20 years ago  . ROBOTIC ASSISTED SALPINGO OOPHERECTOMY Left 03/13/2015   Procedure: ROBOTIC ASSISTED INCISION OF  LEFT PELVIC MASS, LYSIS OF ADHESIONS  WITH PELVIC WASHINGS;  Surgeon: Nunzio Cobbs, MD;  Location: [facility];  Service: Gynecology;  Laterality: Left;  .  TONSILLECTOMY     as child  . TUBAL LIGATION  1976    There were no vitals filed for this visit.      Subjective Assessment - November 01, 2016 1120    Subjective Pt reports since she started therapy that she feels an overall improvements, especially in feeling more lose after consistent stretching. She reports that she can reach her feet more easily now, which has improved her ability to don shoes/socks, don clothing, and bathe herself.    Pertinent History Denies surgical issues on that knee; 9/10 at worse (night); 1/10 at best; particularly bad in mornings, but is limited to sleeping in a recliner.    Limitations Sitting;Standing;Walking   How long can you sit comfortably? 90-120 minutes sitting; this has not changed since evaluation.    How long can you stand comfortably? only aggravated by prolonged standing still, 30-40 minutes   How long can you walk comfortably? Patient reports that she still does not walk frequently, but often has to slow her speed to accomodate to her husbands pace, which she thinks is more difficult.    Diagnostic tests X-ray recently, pt reports unremarkable.    Patient Stated Goals Have fewer limitations during sleep and management of stairs.    Currently in Pain? No/denies  some residual soreness from the Left  trochanter with Left sidelying, but no pain            [facility] PT Assessment - 10/10/16 0001      Assessment   Medical Diagnosis Left hip pain   Referring Provider Molli Barrows    Onset Date/Surgical Date 04/28/16   Hand Dominance Right   Next MD Visit First week in November    Prior Therapy Has done PT for this issue before; ~2012     Balance Screen   Has the patient fallen in the past 6 months No   Has the patient had a decrease in activity level because of a fear of falling?  No   Is the patient reluctant to leave their home because of a fear of falling?  No     Strength   Right Hip Flexion 5/5  4+/5 at evaluation   Right Hip Extension 4-/5   *standing due to prone restrictions.    Right Hip External Rotation  --  5/5 at evaluation   Right Hip Internal Rotation --  5/5 at evaluation   Right Hip ABduction 5/5  5/5 horizontal ABD seated; 4-/5 in left sidelying   Right Hip ADduction --  5/5 at evaluation   Left Hip Flexion 5/5  4-/5 at evaluation   Left Hip Extension 4+/5  4/5 at evaluation (tested in standing)    Left Hip External Rotation --  5/5 at evaluation   Left Hip Internal Rotation --  5/5 at evaluation   Left Hip ABduction 5/5  5/5 horizontal abd seated; 4/5 in Rt sidelying   Left Hip ADduction --  5/5 at evaluation     Transfers   Number of Reps Other reps (comment)  30sec chair rise: 20x (15x on 9/18)     Ambulation/Gait   Ambulation Distance (Feet) 675 Feet   Assistive device None   Gait velocity 1.60ms   Gait Comments Pain is better controlled compared to eval.   Pain increases from 1/10 to 2/10 after 400' at eval     Balance   Balance Assessed Yes     High Level Balance   High Level Balance Comments SLS Balance: R:22.3s; L:23.5s (c trendelenburg sign)  (eval) SLS Balance: R:4s; L:18.5s (c trendelenburg sign)                             PT Education - 10/10/16 1223    Education provided Yes   Education Details Extensive education on postural modification in standing to improve hip comfort, as well as importance of establishing a return to walking program for general health and wellness.    Person(s) Educated Patient   Methods Explanation;Demonstration;Tactile cues   Comprehension Verbalized understanding;Returned demonstration          PT Short Term Goals - 10/10/16 1153      PT SHORT TERM GOAL #1   Title After 2 weeks pt will demonstrate independence in HEP with items available for pain management.    Baseline Stretches have helped to address acute exacerbation of stiffness and/or pain.    Status Achieved     PT SHORT TERM GOAL #2   Title After 4 weeks pt  will demonstrate improved global strength in BLE with 30s Chair Rise>18x.    Baseline 09/04/16: 15x; 20x on 10/13   Status Achieved     PT SHORT TERM GOAL #3   Title After 4 weeks pt will demonstrate improved improved hip strength/balance with  SLS>10s bilat.    Baseline >20sec bilat at reassessment.    Status Achieved     PT SHORT TERM GOAL #4   Title After 4 weeks pt will demonstrate improved fascial restrictions within the bilat gluteal muscularture tolerating deep palaption with pain <2/10.    Baseline on going, still painful, knots and bands are improved.    Status Partially Met           PT Long Term Goals - 11-03-16 1157      PT LONG TERM GOAL #2   Title After 7 weeks pt will demonstrate improved global strength in BLE with 30s Chair Rise>17x.   Status Achieved               Plan - 11/03/2016 1229    Clinical Impression Statement Reassessment performed today. Subjective report demonstrates mild but clear progress toward improving activity tolerance and reducing pain. Objective tests and measures demonstrate excellent improvement in balance, isolated muscle strength, global functional strength, gait speed, and activity tolerance. Pt continues to have some myofascial restrictions in the bilat gluteals, reduced hip stratgey on the right, reduced ankle stratgey on the left, and mild generalized RLE weakness compared bilat. The first 4 weeks of rehab were strongly situated around improving pain, improving joint mobility, and reducing soft tissue restrictions and myofascial restrictions. Going forward the focus will swing hard toward improving isolated strength deficits, improving gait quality/tolerance, and establishing a regular walking program to be continued after DC.    Rehab Potential Good   Clinical Impairments Affecting Rehab Potential Chonicity of complaint   PT Frequency 2x / week   PT Duration 8 weeks   PT Treatment/Interventions Electrical Stimulation;Moist Heat;Gait  training;Stair training;Functional mobility training;Therapeutic activities;Therapeutic exercise;Patient/family education;Neuromuscular re-education;Balance training;Dry needling;Passive range of motion;Manual techniques   PT Next Visit Plan Teach HEP updates: Supine HS bridge (narrow), standing hip extension at counter, standing posterolateral hip circles, standing wide stance heel raises, walking program schedule.    PT Home Exercise Plan 9/12: Corky Sox stretch, Fadir Stretch, Bridging, Piriformis Stretch (seated);    Consulted and Agree with Plan of Care Patient      Patient will benefit from skilled therapeutic intervention in order to improve the following deficits and impairments:  Decreased activity tolerance, Decreased balance, Difficulty walking, Increased edema, Obesity, Decreased strength, Increased muscle spasms, Postural dysfunction, Improper body mechanics, Pain, Increased fascial restricitons  Visit Diagnosis: Other muscle spasm  Muscle weakness (generalized)  Unsteadiness on feet       G-Codes - Nov 03, 2016 1222    Functional Assessment Tool Used Clinical Judgment    Functional Limitation Mobility: Walking and moving around   Mobility: Walking and Moving Around Current Status [phone]) At least 1 percent but less than 20 percent impaired, limited or restricted   Mobility: Walking and Moving Around Goal Status [phone]) At least 1 percent but less than 20 percent impaired, limited or restricted      Problem List Patient Active Problem List   Diagnosis Date Noted  . OTHER DISEASES OF VOCAL CORDS 01/01/2009  . GERD 01/01/2009  . COUGH 01/01/2009    12:41 PM, 11-03-16 Etta Grandchild, PT, DPT Physical Therapist at [facility] [phone] (office)      [place] [address] [place], [place], [address] Phone: [phone]   Fax:  [phone]  Name: Victoria Andrade MRN: [id number] Date of Birth:  1946/11/11

## 2016-10-13 ENCOUNTER — Ambulatory Visit (HOSPITAL_COMMUNITY): Payer: Medicare Other

## 2016-10-13 DIAGNOSIS — R2681 Unsteadiness on feet: Secondary | ICD-10-CM

## 2016-10-13 DIAGNOSIS — M6281 Muscle weakness (generalized): Secondary | ICD-10-CM

## 2016-10-13 DIAGNOSIS — M62838 Other muscle spasm: Secondary | ICD-10-CM

## 2016-10-13 NOTE — Therapy (Signed)
[place], [place], [address] Phone: [phone]   Fax:  [phone]  Physical Therapy Treatment  Patient Details  Name: Victoria Andrade MRN: [id number] Date of Birth: 07-Nov-1946 Referring Provider: Molli Barrows   Encounter Date: 10/13/2016      PT End of Session - 10/13/16 1137    Visit Number 9   Number of Visits 16   Date for PT Re-Evaluation 2016/11/29   Authorization Type UHC Medicare    Authorization Time Period Sep 29, 2016-2016-11-29 (G codes done 2016-11-05)   Authorization - Visit Number 9   Authorization - Number of Visits 18   PT Start Time 1118   PT Stop Time 1158   PT Time Calculation (min) 40 min   Activity Tolerance Patient tolerated treatment well;Patient limited by fatigue   Behavior During Therapy [facility] for tasks assessed/performed      Past Medical History:  Diagnosis Date  . Anxiety    takes xanax  . Arthritis    fingers and neck  . Cataract immature   . Elevated hemoglobin A1c March 2016   level 6.2  . GERD (gastroesophageal reflux disease)   . Headache(784.0)    migraines  . History of hiatal hernia   . Hypertension   . IBS (irritable bowel syndrome)   . Kidney stones   . Melanoma ([place])    left eye  . Osteopenia 2016  . PONV (postoperative nausea and vomiting)     Past Surgical History:  Procedure Laterality Date  . ABDOMINAL HYSTERECTOMY  1978   partial  . BILATERAL SALPINGOOPHORECTOMY  2010   with robot  . CHOLECYSTECTOMY  2005   lap  . CYSTOSCOPY N/A 03/13/2015   Procedure: CYSTOSCOPY;  Surgeon: Nunzio Cobbs, MD;  Location: [facility];  Service: Gynecology;  Laterality: N/A;  . LITHOTRIPSY     20 years ago  . ROBOTIC ASSISTED SALPINGO OOPHERECTOMY Left 03/13/2015   Procedure: ROBOTIC ASSISTED INCISION OF  LEFT PELVIC MASS, LYSIS OF ADHESIONS  WITH PELVIC WASHINGS;  Surgeon: Nunzio Cobbs, MD;  Location: [facility];  Service: Gynecology;  Laterality: Left;  .  TONSILLECTOMY     as child  . TUBAL LIGATION  1976    There were no vitals filed for this visit.      Subjective Assessment - 10/13/16 1121    Subjective Pt is doing well today. She said manual therpay last visit left some mild soreness but nothing too bad. Stretches and HEP are going well at home.    Currently in Pain? No/denies                         [facility] Adult PT Treatment/Exercise - 10/13/16 0001      Knee/Hip Exercises: Standing   Hip Abduction Both;10 reps;2 sets  heavy cues required for correct form; poor proprioceptive    Hip Extension Both;10 reps;2 sets  heavy cues required for correct form; poor proprioceptive    Lateral Step Up Both;2 sets;Step Height: 6"  2x8 bilat 6"box + 2" foam, barefoot, hands free   Lateral Step Up Limitations hands free for balance training; dynamic stability   Other Standing Knee Exercises lateral side stepping with band   4x86f c 2# ankle weights   Other Standing Knee Exercises tandem stance 5X15" each LE lead no UE assist  SLS Foam 10x5s bilat alternating      Knee/Hip Exercises: Supine  Bridges Strengthening;Both;Other (comment);2 sets  2x8 Feet on physio ball, knees straight     Knee/Hip Exercises: Sidelying   Other Sidelying Knee/Hip Exercises standing hip circles: 20x each side                  PT Short Term Goals - 10/10/16 1153      PT SHORT TERM GOAL #1   Title After 2 weeks pt will demonstrate independence in HEP with items available for pain management.    Baseline Stretches have helped to address acute exacerbation of stiffness and/or pain.    Status Achieved     PT SHORT TERM GOAL #2   Title After 4 weeks pt will demonstrate improved global strength in BLE with 30s Chair Rise>18x.    Baseline 09/04/16: 15x; 20x on 10/13   Status Achieved     PT SHORT TERM GOAL #3   Title After 4 weeks pt will demonstrate improved improved hip strength/balance with SLS>10s bilat.    Baseline >20sec bilat  at reassessment.    Status Achieved     PT SHORT TERM GOAL #4   Title After 4 weeks pt will demonstrate improved fascial restrictions within the bilat gluteal muscularture tolerating deep palaption with pain <2/10.    Baseline on going, still painful, knots and bands are improved.    Status Partially Met           PT Long Term Goals - 10/13/16 1150      PT LONG TERM GOAL #1   Title After 7 weeks pt will demonstrate independence in HEP with items available for continued progress s/p DC.    Status New     PT LONG TERM GOAL #2   Title After 7 weeks pt will demonstrate improved global strength in BLE with 30s Chair Rise>17x.   Status Achieved     PT LONG TERM GOAL #3   Title After 7 weeks pt will demonstrate improved improved hip strength/balance with SLS>30s bilat.    Status New     PT LONG TERM GOAL #4   Title After 7 weeks pt will demonstrate improved fascial restrictions within the bilat gluteal muscularture tolerating deep palaption without pain.    Status New               Plan - 10/13/16 1138    Clinical Impression Statement Progra is advanced today to include mor eisolated hip strengthening and balance training. Pt has moderate difficulty performing these with good quality of movement, difficulty following cues for form correction at times, but remains highly motivated and is able to complete without aggravation of pain.    Rehab Potential Good   Clinical Impairments Affecting Rehab Potential Chonicity of complaint   PT Frequency 2x / week   PT Duration 8 weeks   PT Treatment/Interventions Electrical Stimulation;Moist Heat;Gait training;Stair training;Functional mobility training;Therapeutic activities;Therapeutic exercise;Patient/family education;Neuromuscular re-education;Balance training;Dry needling;Passive range of motion;Manual techniques   PT Home Exercise Plan 9/12: Faber stretch, Fadir Stretch, Bridging, Piriformis Stretch (seated);       Patient will  benefit from skilled therapeutic intervention in order to improve the following deficits and impairments:  Decreased activity tolerance, Decreased balance, Difficulty walking, Increased edema, Obesity, Decreased strength, Increased muscle spasms, Postural dysfunction, Improper body mechanics, Pain, Increased fascial restricitons  Visit Diagnosis: Other muscle spasm  Muscle weakness (generalized)  Unsteadiness on feet     Problem List Patient Active Problem List   Diagnosis Date Noted  . OTHER DISEASES OF VOCAL CORDS  01/01/2009  . GERD 01/01/2009  . COUGH 01/01/2009    12:01 PM, 10/13/16 Etta Grandchild, PT, DPT Physical Therapist at [place] [place] [phone] (office)     [place] [address] [place], [place], [address] Phone: [phone]   Fax:  [phone]  Name: MALIK RUFFINO MRN: [id number] Date of Birth: 12-Apr-1946

## 2016-10-16 ENCOUNTER — Ambulatory Visit (HOSPITAL_COMMUNITY): Payer: Medicare Other | Admitting: Physical Therapy

## 2016-10-16 DIAGNOSIS — M6281 Muscle weakness (generalized): Secondary | ICD-10-CM

## 2016-10-16 DIAGNOSIS — M62838 Other muscle spasm: Secondary | ICD-10-CM | POA: Diagnosis not present

## 2016-10-16 DIAGNOSIS — R2681 Unsteadiness on feet: Secondary | ICD-10-CM

## 2016-10-16 NOTE — Therapy (Signed)
[place], [place], [address] Phone: [phone]   Fax:  [phone]  Physical Therapy Treatment  Patient Details  Name: REGINA GANCI MRN: [id number] Date of Birth: Apr 05, 1946 Referring Provider: Molli Barrows   Encounter Date: 10/16/2016      PT End of Session - 10/16/16 1157    Visit Number 10   Number of Visits 16   Date for PT Re-Evaluation 11/04/16   Authorization Type [employer]HC Medicare    Authorization Time Period 2016/09/04-Nov 04, 2016 (G codes done 10/11/2016)   Authorization - Visit Number 10   Authorization - Number of Visits 18   PT Start Time 1115   PT Stop Time 1155   PT Time Calculation (min) 40 min   Activity Tolerance Patient tolerated treatment well;Patient limited by fatigue   Behavior During Therapy [facility] for tasks assessed/performed      Past Medical History:  Diagnosis Date  . Anxiety    takes xanax  . Arthritis    fingers and neck  . Cataract immature   . Elevated hemoglobin A1c March 2016   level 6.2  . GERD (gastroesophageal reflux disease)   . Headache(784.0)    migraines  . History of hiatal hernia   . Hypertension   . IBS (irritable bowel syndrome)   . Kidney stones   . Melanoma ([place])    left eye  . Osteopenia 2016  . PONV (postoperative nausea and vomiting)     Past Surgical History:  Procedure Laterality Date  . ABDOMINAL HYSTERECTOMY  1978   partial  . BILATERAL SALPINGOOPHORECTOMY  2010   with robot  . CHOLECYSTECTOMY  2005   lap  . CYSTOSCOPY N/A 03/13/2015   Procedure: CYSTOSCOPY;  Surgeon: Nunzio Cobbs, MD;  Location: [facility];  Service: Gynecology;  Laterality: N/A;  . LITHOTRIPSY     20 years ago  . ROBOTIC ASSISTED SALPINGO OOPHERECTOMY Left 03/13/2015   Procedure: ROBOTIC ASSISTED INCISION OF  LEFT PELVIC MASS, LYSIS OF ADHESIONS  WITH PELVIC WASHINGS;  Surgeon: Nunzio Cobbs, MD;  Location: [facility];  Service: Gynecology;  Laterality: Left;  .  TONSILLECTOMY     as child  . TUBAL LIGATION  1976    There were no vitals filed for this visit.      Subjective Assessment - 10/16/16 1111    Subjective Pt states that she only has pain when she places her whole weight on her leg.  Steps are still difficult for her .     Pertinent History Denies surgical issues on that knee; 9/10 at worse (night); 1/10 at best; particularly bad in mornings, but is limited to sleeping in a recliner.    Limitations Sitting;Standing;Walking   How long can you sit comfortably? 90-120 minutes sitting; this has not changed since evaluation.    How long can you stand comfortably? only aggravated by prolonged standing still, 30-40 minutes   How long can you walk comfortably? Patient reports that she still does not walk frequently, but often has to slow her speed to accomodate to her husbands pace, which she thinks is more difficult.    Diagnostic tests X-ray recently, pt reports unremarkable.    Patient Stated Goals Have fewer limitations during sleep and management of stairs.    Currently in Pain? Yes   Pain Score 1    Pain Orientation Left;Lateral   Pain Descriptors / Indicators Aching;Dull   Pain Onset 1 to 4 weeks  ago   Pain Frequency Constant   Aggravating Factors  weight bearing    Pain Relieving Factors ice   Effect of Pain on Daily Activities increased                         [facility] Adult PT Treatment/Exercise - 10/16/16 0001      Knee/Hip Exercises: Stretches   Hip Flexor Stretch Left;2 reps;30 seconds   Hip Flexor Stretch Limitations supine      Knee/Hip Exercises: Standing   Other Standing Knee Exercises side step with t-band x 2 RT; hip extension x 10    Other Standing Knee Exercises tandem stance with head turns x 10;  SLS Foam 10x5s bilat alternating      Knee/Hip Exercises: Seated   Sit to Sand 10 reps  10 with rt in back then 10 with left in back      Knee/Hip Exercises: Sidelying   Hip ABduction Strengthening;10  reps   Other Sidelying Knee/Hip Exercises hip circles 10 x forward/10 x backward x 2 reps      Manual Therapy   Manual Therapy Soft tissue mobilization   Manual therapy comments done seperately from all other skilled interventions   Myofascial Release to Lt gluteal musculature in Rt sidelying as well as IT band                   PT Short Term Goals - 10/10/16 1153      PT SHORT TERM GOAL #1   Title After 2 weeks pt will demonstrate independence in HEP with items available for pain management.    Baseline Stretches have helped to address acute exacerbation of stiffness and/or pain.    Status Achieved     PT SHORT TERM GOAL #2   Title After 4 weeks pt will demonstrate improved global strength in BLE with 30s Chair Rise>18x.    Baseline 09/04/16: 15x; 20x on 10/13   Status Achieved     PT SHORT TERM GOAL #3   Title After 4 weeks pt will demonstrate improved improved hip strength/balance with SLS>10s bilat.    Baseline >20sec bilat at reassessment.    Status Achieved     PT SHORT TERM GOAL #4   Title After 4 weeks pt will demonstrate improved fascial restrictions within the bilat gluteal muscularture tolerating deep palaption with pain <2/10.    Baseline on going, still painful, knots and bands are improved.    Status Partially Met           PT Long Term Goals - 10/13/16 1150      PT LONG TERM GOAL #1   Title After 7 weeks pt will demonstrate independence in HEP with items available for continued progress s/p DC.    Status New     PT LONG TERM GOAL #2   Title After 7 weeks pt will demonstrate improved global strength in BLE with 30s Chair Rise>17x.   Status Achieved     PT LONG TERM GOAL #3   Title After 7 weeks pt will demonstrate improved improved hip strength/balance with SLS>30s bilat.    Status New     PT LONG TERM GOAL #4   Title After 7 weeks pt will demonstrate improved fascial restrictions within the bilat gluteal muscularture tolerating deep palaption  without pain.    Status New               Plan - 10/16/16 1158  Clinical Impression Statement Pt unable to complete prone exercises therefore completed glut max strengthening while on forearms on counter.  Added head turns with tandem stance with pt needing contact guard assist.  Pt with distal tightness of IT band which was relieved with manual.    Rehab Potential Good   Clinical Impairments Affecting Rehab Potential Chonicity of complaint   PT Frequency 2x / week   PT Duration 8 weeks   PT Treatment/Interventions Electrical Stimulation;Moist Heat;Gait training;Stair training;Functional mobility training;Therapeutic activities;Therapeutic exercise;Patient/family education;Neuromuscular re-education;Balance training;Dry needling;Passive range of motion;Manual techniques   PT Next Visit Plan continue with stretchng and strengtheing of hip musculature.    PT Home Exercise Plan 9/12: Corky Sox stretch, Fadir Stretch, Bridging, Piriformis Stretch (seated);       Patient will benefit from skilled therapeutic intervention in order to improve the following deficits and impairments:  Decreased activity tolerance, Decreased balance, Difficulty walking, Increased edema, Obesity, Decreased strength, Increased muscle spasms, Postural dysfunction, Improper body mechanics, Pain, Increased fascial restricitons  Visit Diagnosis: Other muscle spasm  Muscle weakness (generalized)  Unsteadiness on feet     Problem List Patient Active Problem List   Diagnosis Date Noted  . OTHER DISEASES OF VOCAL CORDS 01/01/2009  . GERD 01/01/2009  . COUGH 01/01/2009   Rayetta Humphrey, PT CLT [phone] 10/16/2016, 12:03 PM  [place] [address] [place], [place], [address] Phone: [phone]   Fax:  [phone]  Name: ATARAH CADOGAN MRN: [id number] Date of Birth: 04/22/1946

## 2016-10-20 ENCOUNTER — Ambulatory Visit (HOSPITAL_COMMUNITY): Payer: Medicare Other

## 2016-10-21 ENCOUNTER — Other Ambulatory Visit: Payer: Self-pay | Admitting: Orthopedic Surgery

## 2016-10-21 DIAGNOSIS — M25512 Pain in left shoulder: Secondary | ICD-10-CM

## 2016-10-21 LAB — FECAL OCCULT BLOOD, IMMUNOCHEMICAL: IMMUNOLOGICAL FECAL OCCULT BLOOD TEST: NEGATIVE

## 2016-10-22 ENCOUNTER — Ambulatory Visit (HOSPITAL_COMMUNITY): Payer: Medicare Other

## 2016-10-22 DIAGNOSIS — M6281 Muscle weakness (generalized): Secondary | ICD-10-CM

## 2016-10-22 DIAGNOSIS — M62838 Other muscle spasm: Secondary | ICD-10-CM | POA: Diagnosis not present

## 2016-10-22 DIAGNOSIS — R2681 Unsteadiness on feet: Secondary | ICD-10-CM
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
## 2016-10-23 ENCOUNTER — Encounter (HOSPITAL_COMMUNITY): Payer: Medicare Other

## 2016-10-23 ENCOUNTER — Ambulatory Visit (HOSPITAL_COMMUNITY): Payer: Medicare Other

## 2016-10-23 DIAGNOSIS — R2681 Unsteadiness on feet: Secondary | ICD-10-CM

## 2016-10-23 DIAGNOSIS — M62838 Other muscle spasm: Secondary | ICD-10-CM | POA: Diagnosis not present

## 2016-10-23 DIAGNOSIS — M6281 Muscle weakness (generalized): Secondary | ICD-10-CM
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
## 2016-10-24 ENCOUNTER — Ambulatory Visit (HOSPITAL_COMMUNITY): Payer: Medicare Other

## 2016-10-27 ENCOUNTER — Ambulatory Visit (HOSPITAL_COMMUNITY): Payer: Medicare Other

## 2016-10-27 DIAGNOSIS — M62838 Other muscle spasm: Secondary | ICD-10-CM | POA: Diagnosis not present

## 2016-10-27 DIAGNOSIS — M6281 Muscle weakness (generalized): Secondary | ICD-10-CM

## 2016-10-27 DIAGNOSIS — R2681 Unsteadiness on feet: Secondary | ICD-10-CM

## 2016-10-27 NOTE — Therapy (Signed)
[place], [place], [address] Phone: [phone]   Fax:  [phone]  Physical Therapy Treatment  Patient Details  Name: Victoria Andrade MRN: [id number] Date of Birth: 01-07-1946 Referring Provider: Molli Barrows   Encounter Date: 10/27/2016      PT End of Session - 10/27/16 1103    Visit Number 13   Number of Visits 18   Date for PT Re-Evaluation 11/09/16   Authorization Type [employer] Medicare    Authorization Time Period 09-09-16-11/09/16 (G codes done 10/16/2016)   Authorization - Visit Number 13   Authorization - Number of Visits 18   PT Start Time 1030   PT Stop Time 1112   PT Time Calculation (min) 42 min   Activity Tolerance Patient tolerated treatment well;Patient limited by fatigue   Behavior During Therapy [facility] for tasks assessed/performed      Past Medical History:  Diagnosis Date  . Anxiety    takes xanax  . Arthritis    fingers and neck  . Cataract immature   . Elevated hemoglobin A1c March 2016   level 6.2  . GERD (gastroesophageal reflux disease)   . Headache(784.0)    migraines  . History of hiatal hernia   . Hypertension   . IBS (irritable bowel syndrome)   . Kidney stones   . Melanoma ([place])    left eye  . Osteopenia 2016  . PONV (postoperative nausea and vomiting)     Past Surgical History:  Procedure Laterality Date  . ABDOMINAL HYSTERECTOMY  1978   partial  . BILATERAL SALPINGOOPHORECTOMY  2010   with robot  . CHOLECYSTECTOMY  2005   lap  . CYSTOSCOPY N/A 03/13/2015   Procedure: CYSTOSCOPY;  Surgeon: Nunzio Cobbs, MD;  Location: [facility];  Service: Gynecology;  Laterality: N/A;  . LITHOTRIPSY     20 years ago  . ROBOTIC ASSISTED SALPINGO OOPHERECTOMY Left 03/13/2015   Procedure: ROBOTIC ASSISTED INCISION OF  LEFT PELVIC MASS, LYSIS OF ADHESIONS  WITH PELVIC WASHINGS;  Surgeon: Nunzio Cobbs, MD;  Location: [facility];  Service: Gynecology;  Laterality: Left;  .  TONSILLECTOMY     as child  . TUBAL LIGATION  1976    There were no vitals filed for this visit.      Subjective Assessment - 10/27/16 1038    Subjective Pt reports she has been doing fairly well in terms of her hip pain. She has had many other issues with her shoulder, cataracts, and GI. Her walking program is going well, as she has been trying to improve activity time, as well as her HEP when able to (her shoulder pain restricts her from being able to do them).     Pertinent History Denies surgical issues on that knee; 9/10 at worse (night); 1/10 at best; particularly bad in mornings, but is limited to sleeping in a recliner.    Currently in Pain? Yes   Pain Score 2    Pain Orientation Left  trochanteric                          [facility] Adult PT Treatment/Exercise - 10/27/16 0001      Knee/Hip Exercises: Standing   Hip Abduction Both;10 reps;2 sets  standing tall against door, heel slides   Hip Extension Both;10 reps;2 sets  leaning on countertop     Knee/Hip Exercises: Seated   Ball Squeeze Seated  Hip IR c RedTB at ankles   15x3secH    Clamshell with TheraBand Red  Seated clam with redTB 15x3sH             Balance Exercises - 10/27/16 1046      Balance Exercises: Standing   SLS 10 secs;Eyes open  10x10sec SLS, alternating sides (20x total)              PT Short Term Goals - 10/10/16 1153      PT SHORT TERM GOAL #1   Title After 2 weeks pt will demonstrate independence in HEP with items available for pain management.    Baseline Stretches have helped to address acute exacerbation of stiffness and/or pain.    Status Achieved     PT SHORT TERM GOAL #2   Title After 4 weeks pt will demonstrate improved global strength in BLE with 30s Chair Rise>18x.    Baseline 09/04/16: 15x; 20x on 10/13   Status Achieved     PT SHORT TERM GOAL #3   Title After 4 weeks pt will demonstrate improved improved hip strength/balance with SLS>10s bilat.     Baseline >20sec bilat at reassessment.    Status Achieved     PT SHORT TERM GOAL #4   Title After 4 weeks pt will demonstrate improved fascial restrictions within the bilat gluteal muscularture tolerating deep palaption with pain <2/10.    Baseline on going, still painful, knots and bands are improved.    Status Partially Met           PT Long Term Goals - 10/13/16 1150      PT LONG TERM GOAL #1   Title After 7 weeks pt will demonstrate independence in HEP with items available for continued progress s/p DC.    Status New     PT LONG TERM GOAL #2   Title After 7 weeks pt will demonstrate improved global strength in BLE with 30s Chair Rise>17x.   Status Achieved     PT LONG TERM GOAL #3   Title After 7 weeks pt will demonstrate improved improved hip strength/balance with SLS>30s bilat.    Status New     PT LONG TERM GOAL #4   Title After 7 weeks pt will demonstrate improved fascial restrictions within the bilat gluteal muscularture tolerating deep palaption without pain.    Status New               Plan - 10/27/16 1106    Clinical Impression Statement Pt toleratign session well today, no exacerbation of pain, and continued progress of therex with good form. Pt introduced to new, advanced HEP items todaya and educated on independent performance. Making good progress toward goals. Next session will perform reassessment and preparation for DC.    Rehab Potential Good   Clinical Impairments Affecting Rehab Potential Chonicity of complaint   PT Frequency 2x / week   PT Duration 8 weeks   PT Treatment/Interventions Electrical Stimulation;Moist Heat;Gait training;Stair training;Functional mobility training;Therapeutic activities;Therapeutic exercise;Patient/family education;Neuromuscular re-education;Balance training;Dry needling;Passive range of motion;Manual techniques   PT Next Visit Plan continue with stretchng and strengtheing of hip musculature.    PT Home Exercise Plan  9/12: [place]er stretch, Fadir Stretch, Bridging, Piriformis Stretch (seated); 10/30: Seated hip IR c band, Seated hip ER c band, Standing hip ABD, forward flexed hip ext.    Consulted and Agree with Plan of Care Patient      Patient will benefit from skilled therapeutic intervention in order to  improve the following deficits and impairments:  Decreased activity tolerance, Decreased balance, Difficulty walking, Increased edema, Obesity, Decreased strength, Increased muscle spasms, Postural dysfunction, Improper body mechanics, Pain, Increased fascial restricitons  Visit Diagnosis: Other muscle spasm  Muscle weakness (generalized)  Unsteadiness on feet     Problem List Patient Active Problem List   Diagnosis Date Noted  . OTHER DISEASES OF VOCAL CORDS 01/01/2009  . GERD 01/01/2009  . COUGH 01/01/2009   11:18 AM, 10/27/16 Etta Grandchild, PT, DPT Physical Therapist at [facility] [phone] (office)     [facility] [place] [facility] [address] [place], [place], [address] Phone: [phone]   Fax:  [phone]  Name: MIYU FENDERSON MRN: [id number] Date of Birth: January 15, 1946

## 2016-11-03 ENCOUNTER — Ambulatory Visit (HOSPITAL_COMMUNITY): Payer: Medicare Other | Attending: Physician Assistant

## 2016-11-03 ENCOUNTER — Ambulatory Visit
Admission: RE | Admit: 2016-11-03 | Discharge: 2016-11-03 | Disposition: A | Discharge status: Home / Self Care | Payer: Medicare Other | Source: Ambulatory Visit | Attending: Orthopedic Surgery | Admitting: Orthopedic Surgery

## 2016-11-03 DIAGNOSIS — M6281 Muscle weakness (generalized): Secondary | ICD-10-CM

## 2016-11-03 DIAGNOSIS — M25512 Pain in left shoulder: Secondary | ICD-10-CM

## 2016-11-03 DIAGNOSIS — R2681 Unsteadiness on feet: Secondary | ICD-10-CM

## 2016-11-03 DIAGNOSIS — M62838 Other muscle spasm: Secondary | ICD-10-CM

## 2016-11-03 DIAGNOSIS — M25612 Stiffness of left shoulder, not elsewhere classified: Secondary | ICD-10-CM | POA: Insufficient documentation

## 2016-11-03 DIAGNOSIS — R29898 Other symptoms and signs involving the musculoskeletal system: Secondary | ICD-10-CM | POA: Insufficient documentation

## 2016-11-03 NOTE — Therapy (Signed)
PHYSICAL THERAPY DISCHARGE SUMMARY  Visits from Start of Care: 14  Current functional level related to goals / functional outcomes: *see below   Remaining deficits: *see below   Education / Equipment: *see below Plan: Patient agrees to discharge.  Patient goals were met. Patient is being discharged due to meeting the stated rehab goals.  ?????          12:01 PM, 11/09/16 Etta Grandchild, PT, DPT Physical Therapist at [facility] [phone] (office)                [facility] [address] [place], [place], [address] Phone: [phone]   Fax:  [phone]  Physical Therapy Treatment  Patient Details  Name: Victoria Andrade MRN: [id number] Date of Birth: 09/11/1946 Referring Provider: Molli Barrows   Encounter Date: 11/09/16      PT End of Session - 09-Nov-2016 1154    Visit Number 14   Number of Visits 18   Date for PT Re-Evaluation 2016/11/09   Authorization Type [employer] Medicare    Authorization Time Period 09/09/16-Nov 09, 2016 (G codes done 2016-10-16)   Authorization - Visit Number 14   Authorization - Number of Visits 18   PT Start Time 1118   PT Stop Time 1150   PT Time Calculation (min) 32 min   Activity Tolerance Patient tolerated treatment well;Patient limited by fatigue   Behavior During Therapy [facility] for tasks assessed/performed      Past Medical History:  Diagnosis Date  . Anxiety    takes xanax  . Arthritis    fingers and neck  . Cataract immature   . Elevated hemoglobin A1c March 2016   level 6.2  . GERD (gastroesophageal reflux disease)   . Headache(784.0)    migraines  . History of hiatal hernia   . Hypertension   . IBS (irritable bowel syndrome)   . Kidney stones   . Melanoma ([place])    left eye  . Osteopenia 2016  . PONV (postoperative nausea and vomiting)     Past Surgical History:  Procedure Laterality Date  . ABDOMINAL HYSTERECTOMY  1978   partial  .  BILATERAL SALPINGOOPHORECTOMY  2010   with robot  . CHOLECYSTECTOMY  2005   lap  . CYSTOSCOPY N/A 03/13/2015   Procedure: CYSTOSCOPY;  Surgeon: Nunzio Cobbs, MD;  Location: [facility];  Service: Gynecology;  Laterality: N/A;  . LITHOTRIPSY     20 years ago  . ROBOTIC ASSISTED SALPINGO OOPHERECTOMY Left 03/13/2015   Procedure: ROBOTIC ASSISTED INCISION OF  LEFT PELVIC MASS, LYSIS OF ADHESIONS  WITH PELVIC WASHINGS;  Surgeon: Nunzio Cobbs, MD;  Location: [facility];  Service: Gynecology;  Laterality: Left;  . TONSILLECTOMY     as child  . TUBAL LIGATION  1976    There were no vitals filed for this visit.      Subjective Assessment - 2016-11-09 1121    Subjective Pt reports she is doing well overall. She thinks she is feeling better overall. Pt reports new HEP additions are going well, she was somewhat limited by her recent cataract surgery.    Pertinent History Denies surgical issues on that knee; 3/10 at worse (night); 1/10 at best; particularly bad in mornings, but is limited to sleeping in a recliner.    Limitations Sitting;Standing;Walking   How long can you sit comfortably? 90-120 minutes sitting; this has not changed since evaluation.  How long can you stand comfortably? only aggravated by prolonged standing still, 30-40 minutes (any change is unclear, as pt does nto stand for prologned periods)    How long can you walk comfortably? Has been trying to add in more walking into her daily routine, and has some mild pain in the legs, but thinks she is more limited by SOB/breathing issues.    Diagnostic tests X-ray recently, pt reports unremarkable.    Currently in Pain? Yes   Pain Score --  between 0-1.    Pain Location Hip   Pain Orientation Left  over the TFL area.    Pain Descriptors / Indicators Dull   Pain Onset More than a month ago   Pain Frequency Constant   Aggravating Factors  most weightbearing activity or prolonged sitting.             [facility] PT  Assessment - 11/03/16 0001      Strength   Right Hip Flexion 5/5   Right Hip Extension 5/5  *standing due to prone restrictions.    Left Hip Flexion 5/5   Left Hip Extension 5/5     Transfers   Number of Reps Other reps (comment);Other sets (comment)  30sec chair rise: 21x (20x in 10/13; 15x on 9/18)     High Level Balance   High Level Balance Comments SLS: R-21s; L-55s     (eval) SLS Balance: R:4s; L:18.5s (c trendelenburg sign);SLS                             PT Education - 11/03/16 1153    Education provided Yes   Education Details encouraged to continue with regular physical activity at the senior center upon DC, and to communicate specific deficits to the trainers to allow for modifications to be made as needed.    Person(s) Educated Patient   Methods Explanation;Demonstration;Tactile cues   Comprehension Verbalized understanding;Returned demonstration          PT Short Term Goals - 10/10/16 1153      PT SHORT TERM GOAL #1   Title After 2 weeks pt will demonstrate independence in HEP with items available for pain management.    Baseline Stretches have helped to address acute exacerbation of stiffness and/or pain.    Status Achieved     PT SHORT TERM GOAL #2   Title After 4 weeks pt will demonstrate improved global strength in BLE with 30s Chair Rise>18x.    Baseline 09/04/16: 15x; 20x on 10/13   Status Achieved     PT SHORT TERM GOAL #3   Title After 4 weeks pt will demonstrate improved improved hip strength/balance with SLS>10s bilat.    Baseline >20sec bilat at reassessment.    Status Achieved     PT SHORT TERM GOAL #4   Title After 4 weeks pt will demonstrate improved fascial restrictions within the bilat gluteal muscularture tolerating deep palaption with pain <2/10.    Baseline on going, still painful, knots and bands are improved.    Status Partially Met           PT Long Term Goals - 11/03/16 1138      PT LONG TERM GOAL #1    Title After 7 weeks pt will demonstrate independence in HEP with items available for continued progress s/p DC.    Status Achieved     PT LONG TERM GOAL #2   Title After 7 weeks pt  will demonstrate improved global strength in BLE with 30s Chair Rise>17x.   Baseline 21x at DC.    Status Achieved     PT LONG TERM GOAL #3   Title After 7 weeks pt will demonstrate improved improved hip strength/balance with SLS>30s bilat.    Baseline Met on Left side (50+s), and not met on Right side (<25s)   Status Partially Met     PT LONG TERM GOAL #4   Title After 7 weeks pt will demonstrate improved fascial restrictions within the bilat gluteal muscularture tolerating deep palaption without pain.    Baseline Remains quite painful, but improved since evaluation. Somewhat symmetrical.    Status Partially Met               Plan - 2016/11/21 1155    Clinical Impression Statement Reassessment and DC today. Pt demonstrating excellent progress today goals and overall imporvements in balance, strength, and activity tolerance. Advanced HEP is reviewed with patient with instructions to continue progressing regularl physical activity.    Rehab Potential Good   Clinical Impairments Affecting Rehab Potential Chonicity of complaint   PT Frequency 2x / week   PT Duration 8 weeks   PT Treatment/Interventions Electrical Stimulation;Moist Heat;Gait training;Stair training;Functional mobility training;Therapeutic activities;Therapeutic exercise;Patient/family education;Neuromuscular re-education;Balance training;Dry needling;Passive range of motion;Manual techniques   Consulted and Agree with Plan of Care Patient      Patient will benefit from skilled therapeutic intervention in order to improve the following deficits and impairments:  Decreased activity tolerance, Decreased balance, Difficulty walking, Increased edema, Obesity, Decreased strength, Increased muscle spasms, Postural dysfunction, Improper body  mechanics, Pain, Increased fascial restricitons  Visit Diagnosis: Other muscle spasm  Muscle weakness (generalized)  Unsteadiness on feet       G-Codes - 2016-11-21 1157    Functional Assessment Tool Used Clinical Judgment    Functional Limitation Mobility: Walking and moving around   Mobility: Walking and Moving Around Goal Status [phone]) At least 1 percent but less than 20 percent impaired, limited or restricted   Mobility: Walking and Moving Around Discharge Status [phone]) At least 1 percent but less than 20 percent impaired, limited or restricted      Problem List Patient Active Problem List   Diagnosis Date Noted  . OTHER DISEASES OF VOCAL CORDS 01/01/2009  . GERD 01/01/2009  . COUGH 01/01/2009    12:01 PM, Nov 21, 2016 Etta Grandchild, PT, DPT Physical Therapist at [facility] [phone] (office)     [facility] [address] [place], [place], [address] Phone: [phone]   Fax:  [phone]  Name: Victoria Andrade MRN: [id number] Date of Birth: December 16, 1946

## 2016-11-05 ENCOUNTER — Ambulatory Visit (HOSPITAL_COMMUNITY): Payer: Medicare Other

## 2016-11-24 ENCOUNTER — Ambulatory Visit (HOSPITAL_COMMUNITY): Payer: Medicare Other | Admitting: Specialist

## 2016-11-24 DIAGNOSIS — M25512 Pain in left shoulder: Secondary | ICD-10-CM

## 2016-11-24 DIAGNOSIS — M25612 Stiffness of left shoulder, not elsewhere classified: Secondary | ICD-10-CM

## 2016-11-24 DIAGNOSIS — M62838 Other muscle spasm: Secondary | ICD-10-CM | POA: Diagnosis not present

## 2016-11-24 DIAGNOSIS — R29898 Other symptoms and signs involving the musculoskeletal system: Secondary | ICD-10-CM

## 2016-11-24 NOTE — Patient Instructions (Signed)
SHOULDER: Flexion On Table   Place hands on table, elbows straight. Move hips away from body. Press hands down into table. Hold ___ seconds. ___ reps per set, ___ sets per day, ___ days per week  Abduction (Passive)   With arm out to side, resting on table, lower head toward arm, keeping trunk away from table. Hold ____ seconds. Repeat ____ times. Do ____ sessions per day.  Copyright  VHI. All rights reserved.     Internal Rotation (Assistive)   Seated with elbow bent at right angle and held against side, slide arm on table surface in an inward arc. Repeat ____ times. Do ____ sessions per day. Activity: Use this motion to brush crumbs off the table.  Copyright  VHI. All rights reserved.   

## 2016-11-24 NOTE — Therapy (Signed)
[facility] [place], [place], [address] Phone: [phone]   Fax:  [phone]  Occupational Therapy Evaluation  Patient Details  Name: Victoria Andrade MRN: [id number] Date of Birth: May 05, 1946 Referring Provider: Dr. Justice Britain  Encounter Date: 11/24/2016      OT End of Session - 11/24/16 1429    Visit Number 1   Number of Visits 16   Date for OT Re-Evaluation 01/23/17   Authorization Type [employer] Medicare   Authorization Time Period before 10th visit   Authorization - Visit Number 1   Authorization - Number of Visits 10   OT Start Time 1120   OT Stop Time 1200   OT Time Calculation (min) 40 min   Activity Tolerance Patient tolerated treatment well   Behavior During Therapy [facility] for tasks assessed/performed      Past Medical History:  Diagnosis Date  . Anxiety    takes xanax  . Arthritis    fingers and neck  . Cataract immature   . Elevated hemoglobin A1c March 2016   level 6.2  . GERD (gastroesophageal reflux disease)   . Headache(784.0)    migraines  . History of hiatal hernia   . Hypertension   . IBS (irritable bowel syndrome)   . Kidney stones   . Melanoma ([place])    left eye  . Osteopenia 2016  . PONV (postoperative nausea and vomiting)     Past Surgical History:  Procedure Laterality Date  . ABDOMINAL HYSTERECTOMY  1978   partial  . BILATERAL SALPINGOOPHORECTOMY  2010   with robot  . CHOLECYSTECTOMY  2005   lap  . CYSTOSCOPY N/A 03/13/2015   Procedure: CYSTOSCOPY;  Surgeon: Nunzio Cobbs, MD;  Location: [facility];  Service: Gynecology;  Laterality: N/A;  . LITHOTRIPSY     20 years ago  . ROBOTIC ASSISTED SALPINGO OOPHERECTOMY Left 03/13/2015   Procedure: ROBOTIC ASSISTED INCISION OF  LEFT PELVIC MASS, LYSIS OF ADHESIONS  WITH PELVIC WASHINGS;  Surgeon: Nunzio Cobbs, MD;  Location: [facility];  Service: Gynecology;  Laterality: Left;  . TONSILLECTOMY     as child  .  TUBAL LIGATION  1976    There were no vitals filed for this visit.      Subjective Assessment - 11/24/16 1419    Subjective  S:  I can't move my shoulder certain ways or it will catch.   Pertinent History Mrs. Feser reports increased pain and decreased mobility in her left shoulder for more than 6 months.  She does not remember specific injury.  She consulted with Dr. Onnie Graham and has recieved 2 cortisone injections which did not alleviate her symptoms.  She has been referred to occupational therapy for evaluation and treatment.    Patient Stated Goals I want to be able to move my arm again.    Currently in Pain? Yes   Pain Score 6    Pain Location Shoulder   Pain Orientation Left   Pain Descriptors / Indicators Aching;Constant   Pain Type Acute pain   Pain Onset More than a month ago   Pain Frequency Intermittent   Aggravating Factors  certain movements - the worst is abduction   Pain Relieving Factors nothing   Effect of Pain on Daily Activities decreased use of left arm with functional activities            [facility] OT Assessment - 11/24/16 0001  Assessment   Diagnosis Left Shoulder Calcific Tendonitis   Referring Provider Dr. Justice Britain   Onset Date --  chronic   Prior Therapy recently received PT for Left Hip     Precautions   Precautions None     Restrictions   Weight Bearing Restrictions No     Balance Screen   Has the patient fallen in the past 6 months No   Has the patient had a decrease in activity level because of a fear of falling?  No   Is the patient reluctant to leave their home because of a fear of falling?  No     Home  Environment   Family/patient expects to be discharged to: Private residence   Living Arrangements Spouse/significant other   Lives With Spouse     Prior Function   Level of [place] Retired   Leisure reading, computer use     ADL   ADL comments most painful activities are lifting her arm to the side  so that she can wash underneath (abduction), reaching behind her back, lifting pots and pans, and circular scrubbing motion when she wipes down shower wall.  She sometimes experiences a catching sensation when she reaches behind her head.      Written Expression   Dominant Hand Right     Vision - History   Baseline Vision Wears glasses only for reading     Cognition   Overall Cognitive Status Within Functional Limits for tasks assessed     Observation/Other Assessments   Focus on Therapeutic Outcomes (FOTO)  41/100     Sensation   Light Touch Appears Intact     Coordination   Gross Motor Movements are Fluid and Coordinated Yes   Fine Motor Movements are Fluid and Coordinated Yes     ROM / Strength   AROM / PROM / Strength AROM;Strength     Palpation   Palpation comment Max fascial restrictions in her left shoulder region.       AROM   Overall AROM Comments Assessed in seated, External rotation and internal rotation with shoulder adducted, P/ROM is WFL in supine   AROM Assessment Site Shoulder   Right/Left Shoulder Left   Left Shoulder Flexion 125 Degrees   Left Shoulder ABduction 120 Degrees   Left Shoulder Internal Rotation 70 Degrees   Left Shoulder External Rotation 70 Degrees     Strength   Overall Strength Comments assessed in seated, external rotation and internal rotation with shoulder adducted   Strength Assessment Site Shoulder   Right/Left Shoulder Left   Left Shoulder Flexion 3+/5   Left Shoulder ABduction 3+/5   Left Shoulder Internal Rotation 4+/5   Left Shoulder External Rotation 4/5                  OT Treatments/Exercises (OP) - 11/24/16 0001      Manual Therapy   Manual Therapy Myofascial release   Manual therapy comments completed seperately from all other therapeutic interventions this date   Myofascial Release myofascial release and manual stretching to left upper arm, scapular, and shoulder region to decrease pain and restrictions and  improve pain free mobility in her left shoulder region.                OT Education - 11/24/16 1429    Education provided Yes   Education Details towel slides for shoulder   Person(s) Educated Patient   Methods Explanation;Demonstration;Handout   Comprehension Verbalized understanding;Returned  demonstration          OT Short Term Goals - 11/24/16 1434      OT SHORT TERM GOAL #1   Title Patient will be educated on an HEP for improving left shoulder movement and strength needed for ADL completion.    Time 4   Period Weeks   Status New     OT SHORT TERM GOAL #2   Title Patient will improve left shoulder A/ROM to [facility] for improved ability to lift her left arm for hygiene purposes without pain.    Time 4   Period Weeks   Status New     OT SHORT TERM GOAL #3   Title Patient will improve left shoulder flexion and abduction strength to 4/5 for greater ability to reach out to her side or overhead during ADL completion.   Time 4   Period Weeks   Status New     OT SHORT TERM GOAL #4   Title Patient will decrease left shoulder pain to 4/10 during reaching to side activities.    Time 4   Period Weeks   Status New     OT SHORT TERM GOAL #5   Title Patient will decrease left shoulder restrictions to moderate for greater mobility needed for ADL completion.    Time 4   Period Weeks   Status New           OT Long Term Goals - 11/24/16 1437      OT LONG TERM GOAL #1   Title Patient will use left arm with all B/IADLs and leisure activities with minimal pain and difficulty.   Time 8   Period Weeks   Status New     OT LONG TERM GOAL #2   Title Patient will have WNL A/ROM in her left shoulder in order to reach overhead and behind her head during ADL completion.    Time 8   Period Weeks   Status New     OT LONG TERM GOAL #3   Title Patient will have 5/5 strength in her left shoulder in order to lift bags of groceries and laundry baskets.    Time 8   Period Weeks    Status New     OT LONG TERM GOAL #4   Title Patient will decrease pain in her left shoulder to 2/10 or better during ADL completion.    Time 8   Period Weeks   Status New     OT LONG TERM GOAL #5   Title Patient will have minimal or less fascial restrictions in order to have great er mobility needed for ADL completion.    Time 8   Period Weeks   Status New               Plan - 11/24/16 1430    Clinical Impression Statement A:  Patient is a 70 year old female with ongoing left shoulder pain and decreased mobility secondary to calcific tendonitis of her left shoulder.  Deficits are causing decreased independence with all daily activities, with the most painful movement being activities that involve abduction.     Rehab Potential Good   OT Frequency 2x / week   OT Duration 8 weeks   OT Treatment/Interventions Self-care/ADL training;Therapeutic exercise;Patient/family education;Ultrasound;Neuromuscular education;Manual Therapy;Splinting;Energy conservation;Therapeutic exercises;Iontophoresis;Cryotherapy;DME and/or AE instruction;Therapeutic activities;Electrical Stimulation;Moist Heat;Passive range of motion   Plan P:  Skilled OT intervention to decrease pain and stiffness and improve pain free mobility and strength in left shoulder  in order for patient to use left arm with all daily tasks. Next session, begin P/ROM, AA/ROM, progress as tolerated.    OT Home Exercise Plan 11/24/16:  table slides   Consulted and Agree with Plan of Care Patient      Patient will benefit from skilled therapeutic intervention in order to improve the following deficits and impairments:  Impaired UE functional use, Increased fascial restricitons, Decreased range of motion, Pain, Decreased strength  Visit Diagnosis: Acute pain of left shoulder - Plan: Ot plan of care cert/re-cert  Stiffness of left shoulder, not elsewhere classified - Plan: Ot plan of care cert/re-cert  Other symptoms and signs  involving the musculoskeletal system - Plan: Ot plan of care cert/re-cert      G-Codes - [id number]    Functional Assessment Tool Used FOTO 41/100 59% limited   Functional Limitation Carrying, moving and handling objects   Carrying, Moving and Handling Objects Current Status [id number]) At least 40 percent but less than 60 percent impaired, limited or restricted   Carrying, Moving and Handling Objects Goal Status [id number]) At least 1 percent but less than 20 percent impaired, limited or restricted      Problem List Patient Active Problem List   Diagnosis Date Noted  . OTHER DISEASES OF VOCAL CORDS 01/01/2009  . GERD 01/01/2009  . COUGH 01/01/2009    Vangie Bicker, [place], OTR/L [phone]  11/24/2016, 2:43 PM  [facility] [address] [place], [place], [address] Phone: [phone]   Fax:  [phone]  Name: SOFIYA ASHBRIDGE MRN: [id number] Date of Birth: 09/26/1946

## 2016-11-26 ENCOUNTER — Encounter (HOSPITAL_COMMUNITY): Payer: Self-pay

## 2016-11-26 ENCOUNTER — Ambulatory Visit (HOSPITAL_COMMUNITY): Payer: Medicare Other

## 2016-11-26 DIAGNOSIS — R29898 Other symptoms and signs involving the musculoskeletal system: Secondary | ICD-10-CM

## 2016-11-26 DIAGNOSIS — M62838 Other muscle spasm: Secondary | ICD-10-CM | POA: Diagnosis not present

## 2016-11-26 DIAGNOSIS — M25512 Pain in left shoulder: Secondary | ICD-10-CM

## 2016-11-26 DIAGNOSIS — M25612 Stiffness of left shoulder, not elsewhere classified: Secondary | ICD-10-CM

## 2016-11-26 NOTE — Patient Instructions (Signed)
Perform each exercise _____10-12___ reps. 2-3x days.   Protraction - STANDING  Start by holding a wand or cane at chest height.  Next, slowly push the wand outwards in front of your body so that your elbows become fully straightened. Then, return to the original position.     Shoulder FLEXION - STANDING - PALMS DOWN  In the standing position, hold a wand/cane with both arms, palms down on both sides. Raise up the wand/cane allowing your unaffected arm to perform most of the effort. Your affected arm should be partially relaxed.      Internal/External ROTATION - STANDING  In the standing position, hold a wand/cane with both hands keeping your elbows bent. Move your arms and wand/cane to one side.  Your affected arm should be partially relaxed while your unaffected arm performs most of the effort.       Shoulder ABDUCTION - STANDING  While holding a wand/cane palm face up on the injured side and palm face down on the uninjured side, slowly raise up your injured arm to the side.       Horizontal Abduction/Adduction      Straight arms holding cane at shoulder height, bring cane to right, center, left. Repeat starting to left.   Copyright  VHI. All rights reserved.       

## 2016-11-26 NOTE — Therapy (Signed)
[place], [place], [address] Phone: [phone]   Fax:  [phone]  Occupational Therapy Treatment  Patient Details  Name: Victoria Andrade MRN: [id number] Date of Birth: 08-12-1946 Referring Provider: Dr. Justice Britain  Encounter Date: 11/26/2016      OT End of Session - 11/26/16 1015    Visit Number 2   Number of Visits 16   Date for OT Re-Evaluation 01/23/17   Authorization Type [employer]   Authorization Time Period before 10th visit   Authorization - Visit Number 2   Authorization - Number of Visits 10   OT Start Time [id number]   OT Stop Time 0945   OT Time Calculation (min) 35 min   Activity Tolerance Patient tolerated treatment well   Behavior During Therapy [facility] for tasks assessed/performed      Past Medical History:  Diagnosis Date  . Anxiety    takes xanax  . Arthritis    fingers and neck  . Cataract immature   . Elevated hemoglobin A1c March 2016   level 6.2  . GERD (gastroesophageal reflux disease)   . Headache(784.0)    migraines  . History of hiatal hernia   . Hypertension   . IBS (irritable bowel syndrome)   . Kidney stones   . Melanoma ([place])    left eye  . Osteopenia 2016  . PONV (postoperative nausea and vomiting)     Past Surgical History:  Procedure Laterality Date  . ABDOMINAL HYSTERECTOMY  1978   partial  . BILATERAL SALPINGOOPHORECTOMY  2010   with robot  . CHOLECYSTECTOMY  2005   lap  . CYSTOSCOPY N/A 03/13/2015   Procedure: CYSTOSCOPY;  Surgeon: Nunzio Cobbs, MD;  Location: [facility];  Service: Gynecology;  Laterality: N/A;  . LITHOTRIPSY     20 years ago  . ROBOTIC ASSISTED SALPINGO OOPHERECTOMY Left 03/13/2015   Procedure: ROBOTIC ASSISTED INCISION OF  LEFT PELVIC MASS, LYSIS OF ADHESIONS  WITH PELVIC WASHINGS;  Surgeon: Nunzio Cobbs, MD;  Location: [facility];  Service: Gynecology;  Laterality: Left;  . TONSILLECTOMY     as child  .  TUBAL LIGATION  1976    There were no vitals filed for this visit.      Subjective Assessment - 11/26/16 1011    Subjective  S: The exercise that bothers my shoulder the most is the one where my elbow is bent and I go out and in.   Currently in Pain? Yes   Pain Score 3    Pain Location Shoulder   Pain Orientation Left   Pain Descriptors / Indicators Aching;Constant   Pain Type Acute pain            [facility] OT Assessment - 11/26/16 1012      Assessment   Diagnosis Left Shoulder Calcific Tendonitis     Precautions   Precautions None                  OT Treatments/Exercises (OP) - 11/26/16 1012      Exercises   Exercises Shoulder     Shoulder Exercises: Supine   Protraction PROM;AAROM;10 reps   Horizontal ABduction PROM;AAROM;10 reps   External Rotation PROM;AAROM;10 reps   Internal Rotation PROM;AAROM;10 reps   Flexion PROM;AAROM;10 reps   ABduction PROM;AAROM;10 reps     Shoulder Exercises: Standing   Protraction AAROM;10 reps   Horizontal ABduction AAROM;10 reps   External Rotation  AAROM;10 reps   Internal Rotation AAROM;10 reps   Flexion AAROM;10 reps   ABduction AAROM;10 reps     Manual Therapy   Manual Therapy Myofascial release   Manual therapy comments completed seperately from all other therapeutic interventions this date   Myofascial Release myofascial release and manual stretching to left upper arm, scapular, and shoulder region to decrease pain and restrictions and improve pain free mobility in her left shoulder region.                 OT Education - 11/26/16 1015    Education provided Yes   Education Details Pt was given OT evaluation print out. Reviewed goals and plan of care. Upgraded HEP to include AA/ROM.   Person(s) Educated Patient   Methods Explanation;Demonstration;Verbal cues;Handout   Comprehension Verbalized understanding;Returned demonstration          OT Short Term Goals - 11/26/16 1017      OT SHORT TERM  GOAL #1   Title Patient will be educated on an HEP for improving left shoulder movement and strength needed for ADL completion.    Time 4   Period Weeks   Status On-going     OT SHORT TERM GOAL #2   Title Patient will improve left shoulder A/ROM to [facility] for improved ability to lift her left arm for hygiene purposes without pain.    Time 4   Period Weeks   Status On-going     OT SHORT TERM GOAL #3   Title Patient will improve left shoulder flexion and abduction strength to 4/5 for greater ability to reach out to her side or overhead during ADL completion.   Time 4   Period Weeks   Status On-going     OT SHORT TERM GOAL #4   Title Patient will decrease left shoulder pain to 4/10 during reaching to side activities.    Time 4   Period Weeks   Status On-going     OT SHORT TERM GOAL #5   Title Patient will decrease left shoulder restrictions to moderate for greater mobility needed for ADL completion.    Time 4   Period Weeks   Status On-going           OT Long Term Goals - 11/26/16 1017      OT LONG TERM GOAL #1   Title Patient will use left arm with all B/IADLs and leisure activities with minimal pain and difficulty.   Time 8   Period Weeks   Status On-going     OT LONG TERM GOAL #2   Title Patient will have WNL A/ROM in her left shoulder in order to reach overhead and behind her head during ADL completion.    Time 8   Period Weeks   Status On-going     OT LONG TERM GOAL #3   Title Patient will have 5/5 strength in her left shoulder in order to lift bags of groceries and laundry baskets.    Time 8   Period Weeks   Status On-going     OT LONG TERM GOAL #4   Title Patient will decrease pain in her left shoulder to 2/10 or better during ADL completion.    Time 8   Period Weeks   Status On-going     OT LONG TERM GOAL #5   Title Patient will have minimal or less fascial restrictions in order to have great er mobility needed for ADL completion.    Time 8  Period  Weeks   Status On-going               Plan - 11/26/16 1015    Clinical Impression Statement A: Initiated myofascial release, manual stretching, and AA/ROM. Added AA/ROM to HEP with patient having good carry over. VC for form and technique as needed.   Plan P: Follow up on HEP. Add wall wash and proximal shoulder strengthening.   OT Home Exercise Plan 11/24/16:  table slides 11/29: Added AA/ROM exercises.   Consulted and Agree with Plan of Care Patient      Patient will benefit from skilled therapeutic intervention in order to improve the following deficits and impairments:  Impaired UE functional use, Increased fascial restricitons, Decreased range of motion, Pain, Decreased strength  Visit Diagnosis: Acute pain of left shoulder  Stiffness of left shoulder, not elsewhere classified  Other symptoms and signs involving the musculoskeletal system    Problem List Patient Active Problem List   Diagnosis Date Noted  . OTHER DISEASES OF VOCAL CORDS 01/01/2009  . GERD 01/01/2009  . COUGH 01/01/2009   Ailene Ravel, OTR/L,CBIS  [phone]  11/26/2016, 10:19 AM  [facility] [address] [place], [place], [address] Phone: [phone]   Fax:  [phone]  Name: AKIVA ONDERKO MRN: [id number] Date of Birth: 26-Jun-1946

## 2016-12-01 ENCOUNTER — Ambulatory Visit (HOSPITAL_COMMUNITY): Payer: Medicare Other | Attending: Physician Assistant | Admitting: Specialist

## 2016-12-01 DIAGNOSIS — R29898 Other symptoms and signs involving the musculoskeletal system: Secondary | ICD-10-CM | POA: Diagnosis present

## 2016-12-01 DIAGNOSIS — M25612 Stiffness of left shoulder, not elsewhere classified: Secondary | ICD-10-CM

## 2016-12-01 DIAGNOSIS — M25512 Pain in left shoulder: Secondary | ICD-10-CM | POA: Insufficient documentation

## 2016-12-01 NOTE — Therapy (Signed)
[place], [place], [address] Phone: [phone]   Fax:  [phone]  Occupational Therapy Treatment  Patient Details  Name: Victoria Andrade MRN: [id number] Date of Birth: 04-08-1946 Referring Provider: Dr. Justice Britain  Encounter Date: 12/01/2016      OT End of Session - 12/01/16 1339    Visit Number 3   Number of Visits 16   Date for OT Re-Evaluation 01/23/17  12/24/16 mini reassess   Authorization Type [employer]   Authorization Time Period before 10th visit   Authorization - Visit Number 3   Authorization - Number of Visits 10   OT Start Time [id number]   OT Stop Time 1120   OT Time Calculation (min) 41 min   Activity Tolerance Patient tolerated treatment well   Behavior During Therapy [facility] for tasks assessed/performed      Past Medical History:  Diagnosis Date  . Anxiety    takes xanax  . Arthritis    fingers and neck  . Cataract immature   . Elevated hemoglobin A1c March 2016   level 6.2  . GERD (gastroesophageal reflux disease)   . Headache(784.0)    migraines  . History of hiatal hernia   . Hypertension   . IBS (irritable bowel syndrome)   . Kidney stones   . Melanoma ([place])    left eye  . Osteopenia 2016  . PONV (postoperative nausea and vomiting)     Past Surgical History:  Procedure Laterality Date  . ABDOMINAL HYSTERECTOMY  1978   partial  . BILATERAL SALPINGOOPHORECTOMY  2010   with robot  . CHOLECYSTECTOMY  2005   lap  . CYSTOSCOPY N/A 03/13/2015   Procedure: CYSTOSCOPY;  Surgeon: Nunzio Cobbs, MD;  Location: [facility];  Service: Gynecology;  Laterality: N/A;  . LITHOTRIPSY     20 years ago  . ROBOTIC ASSISTED SALPINGO OOPHERECTOMY Left 03/13/2015   Procedure: ROBOTIC ASSISTED INCISION OF  LEFT PELVIC MASS, LYSIS OF ADHESIONS  WITH PELVIC WASHINGS;  Surgeon: Nunzio Cobbs, MD;  Location: [facility];  Service: Gynecology;  Laterality: Left;  .  TONSILLECTOMY     as child  . TUBAL LIGATION  1976    There were no vitals filed for this visit.      Subjective Assessment - 12/01/16 1338    Subjective  S:  I probably over did it this weekend.  I went shopping and lifted alot of bags.    Currently in Pain? Yes   Pain Score 4    Pain Location Shoulder   Pain Orientation Left   Pain Descriptors / Indicators Aching   Pain Type Acute pain            [facility] OT Assessment - 12/01/16 0001      Assessment   Diagnosis Left Shoulder Calcific Tendonitis   Referring Provider Dr. Justice Britain   Prior Therapy recently received PT for Left Hip     Precautions   Precautions None     Restrictions   Weight Bearing Restrictions No                  OT Treatments/Exercises (OP) - 12/01/16 0001      Exercises   Exercises Shoulder     Shoulder Exercises: Supine   Protraction PROM;5 reps;AROM;10 reps   Horizontal ABduction 10 reps;AROM;PROM;5 reps   External Rotation PROM;5 reps;AROM;10 reps   Internal Rotation PROM;5 reps;AROM;10 reps  Flexion PROM;5 reps;AROM;10 reps   ABduction PROM;10 reps;AROM     Shoulder Exercises: Standing   Protraction AAROM;10 reps   Horizontal ABduction AAROM;10 reps   External Rotation AAROM;Theraband   Theraband Level (Shoulder External Rotation) Level 2 (Red)   Internal Rotation AAROM;Theraband;10 reps   Theraband Level (Shoulder Internal Rotation) Level 2 (Red)   Flexion AAROM;10 reps   ABduction AAROM;10 reps   Extension Theraband;10 reps   Theraband Level (Shoulder Extension) Level 2 (Red)   Row Theraband;10 reps   Theraband Level (Shoulder Row) Level 2 (Red)   Retraction Theraband;10 reps   Theraband Level (Shoulder Retraction) Level 2 (Red)     Shoulder Exercises: ROM/Strengthening   UBE (Upper Arm Bike) 3' reverse at 1.0 for scapular stability   Wall Wash 1'   Proximal Shoulder Strengthening, Supine 10 times each   Proximal Shoulder Strengthening, Seated 10 times each      Manual Therapy   Manual Therapy Myofascial release   Manual therapy comments completed seperately from all other therapeutic interventions this date   Myofascial Release myofascial release and manual stretching to left upper arm, scapular, and shoulder region to decrease pain and restrictions and improve pain free mobility in her left shoulder region.                   OT Short Term Goals - 11/26/16 1017      OT SHORT TERM GOAL #1   Title Patient will be educated on an HEP for improving left shoulder movement and strength needed for ADL completion.    Time 4   Period Weeks   Status On-going     OT SHORT TERM GOAL #2   Title Patient will improve left shoulder A/ROM to [facility] for improved ability to lift her left arm for hygiene purposes without pain.    Time 4   Period Weeks   Status On-going     OT SHORT TERM GOAL #3   Title Patient will improve left shoulder flexion and abduction strength to 4/5 for greater ability to reach out to her side or overhead during ADL completion.   Time 4   Period Weeks   Status On-going     OT SHORT TERM GOAL #4   Title Patient will decrease left shoulder pain to 4/10 during reaching to side activities.    Time 4   Period Weeks   Status On-going     OT SHORT TERM GOAL #5   Title Patient will decrease left shoulder restrictions to moderate for greater mobility needed for ADL completion.    Time 4   Period Weeks   Status On-going           OT Long Term Goals - 11/26/16 1017      OT LONG TERM GOAL #1   Title Patient will use left arm with all B/IADLs and leisure activities with minimal pain and difficulty.   Time 8   Period Weeks   Status On-going     OT LONG TERM GOAL #2   Title Patient will have WNL A/ROM in her left shoulder in order to reach overhead and behind her head during ADL completion.    Time 8   Period Weeks   Status On-going     OT LONG TERM GOAL #3   Title Patient will have 5/5 strength in her left shoulder  in order to lift bags of groceries and laundry baskets.    Time 8   Period Weeks  Status On-going     OT LONG TERM GOAL #4   Title Patient will decrease pain in her left shoulder to 2/10 or better during ADL completion.    Time 8   Period Weeks   Status On-going     OT LONG TERM GOAL #5   Title Patient will have minimal or less fascial restrictions in order to have great er mobility needed for ADL completion.    Time 8   Period Weeks   Status On-going               Plan - 12/01/16 1340    Clinical Impression Statement A:  Patient demonstrating full P/ROM in supine and began A/ROM in supine this date.  Range was functional but painful.  Added tband and UBE in reverse for scapular stability.    Plan P:  Attempt A/ROM in standing, add x to v and w arms for scapular stability, which will decrease pain during functional activities.   Consulted and Agree with Plan of Care Patient      Patient will benefit from skilled therapeutic intervention in order to improve the following deficits and impairments:  Impaired UE functional use, Increased fascial restricitons, Decreased range of motion, Pain, Decreased strength  Visit Diagnosis: Acute pain of left shoulder  Stiffness of left shoulder, not elsewhere classified  Other symptoms and signs involving the musculoskeletal system    Problem List Patient Active Problem List   Diagnosis Date Noted  . OTHER DISEASES OF VOCAL CORDS 01/01/2009  . GERD 01/01/2009  . COUGH 01/01/2009    Vangie Bicker, [place], OTR/L [phone]  12/01/2016, 1:43 PM  [facility] [address] [place], [place], [address] Phone: [phone]   Fax:  [phone]  Name: Victoria Andrade MRN: [id number] Date of Birth: 1946/06/03

## 2016-12-03 ENCOUNTER — Ambulatory Visit (HOSPITAL_COMMUNITY): Payer: Medicare Other | Admitting: Specialist

## 2016-12-03 DIAGNOSIS — M25512 Pain in left shoulder: Secondary | ICD-10-CM | POA: Diagnosis not present

## 2016-12-03 DIAGNOSIS — M25612 Stiffness of left shoulder, not elsewhere classified: Secondary | ICD-10-CM

## 2016-12-03 NOTE — Therapy (Signed)
[place], [place], [address] Phone: [phone]   Fax:  [phone]  Occupational Therapy Treatment  Patient Details  Name: Victoria Andrade MRN: [id number] Date of Birth: Dec 22, 1946 Referring Provider: Dr. Justice Britain  Encounter Date: 12/03/2016      OT End of Session - 12/03/16 1257    Visit Number 4   Number of Visits 16   Date for OT Re-Evaluation 01/23/17  12/24/16 mini reassessment   Authorization Type [employer]   Authorization Time Period before 10th visit   Authorization - Visit Number 4   Authorization - Number of Visits 10   OT Start Time 1120   OT Stop Time 1200   OT Time Calculation (min) 40 min   Activity Tolerance Patient tolerated treatment well   Behavior During Therapy [facility] for tasks assessed/performed      Past Medical History:  Diagnosis Date  . Anxiety    takes xanax  . Arthritis    fingers and neck  . Cataract immature   . Elevated hemoglobin A1c March 2016   level 6.2  . GERD (gastroesophageal reflux disease)   . Headache(784.0)    migraines  . History of hiatal hernia   . Hypertension   . IBS (irritable bowel syndrome)   . Kidney stones   . Melanoma ([place])    left eye  . Osteopenia 2016  . PONV (postoperative nausea and vomiting)     Past Surgical History:  Procedure Laterality Date  . ABDOMINAL HYSTERECTOMY  1978   partial  . BILATERAL SALPINGOOPHORECTOMY  2010   with robot  . CHOLECYSTECTOMY  2005   lap  . CYSTOSCOPY N/A 03/13/2015   Procedure: CYSTOSCOPY;  Surgeon: Nunzio Cobbs, MD;  Location: [facility];  Service: Gynecology;  Laterality: N/A;  . LITHOTRIPSY     20 years ago  . ROBOTIC ASSISTED SALPINGO OOPHERECTOMY Left 03/13/2015   Procedure: ROBOTIC ASSISTED INCISION OF  LEFT PELVIC MASS, LYSIS OF ADHESIONS  WITH PELVIC WASHINGS;  Surgeon: Nunzio Cobbs, MD;  Location: [facility];  Service: Gynecology;  Laterality: Left;  .  TONSILLECTOMY     as child  . TUBAL LIGATION  1976    There were no vitals filed for this visit.      Subjective Assessment - 12/03/16 1257    Subjective  S:  I have been a bit sore kind of like a bruised feeling   Currently in Pain? Yes   Pain Score 1    Pain Location Shoulder   Pain Orientation Left            [facility] OT Assessment - 12/03/16 0001      Assessment   Diagnosis Left Shoulder Calcific Tendonitis   Referring Provider Dr. Justice Britain     Precautions   Precautions None     Restrictions   Weight Bearing Restrictions No                  OT Treatments/Exercises (OP) - 12/03/16 0001      Exercises   Exercises Shoulder     Shoulder Exercises: Supine   Protraction PROM;5 reps;AROM;10 reps   Horizontal ABduction 10 reps;AROM;PROM;5 reps   External Rotation PROM;5 reps;AROM;10 reps   Internal Rotation PROM;5 reps;AROM;10 reps   Flexion PROM;5 reps;AROM;10 reps   ABduction PROM;10 reps;AROM     Shoulder Exercises: Seated   Protraction AROM;10 reps   Horizontal ABduction AROM;10  reps   External Rotation AROM;10 reps   Internal Rotation AROM;10 reps   Flexion AROM;10 reps   Abduction AROM;10 reps     Shoulder Exercises: ROM/Strengthening   UBE (Upper Arm Bike) 3' forward and 3' reverse at 1.0   "W" Arms 10 times with good form   X to V Arms 10 times with min vg for form   Proximal Shoulder Strengthening, Supine 10 times each   Proximal Shoulder Strengthening, Seated 10 times each   Ball on Wall 1' with arm flexed to 90 and 45" with arm abducted to 90     Manual Therapy   Manual Therapy Myofascial release   Manual therapy comments completed seperately from all other therapeutic interventions this date   Myofascial Release myofascial release and manual stretching to left upper arm, scapular, and shoulder region to decrease pain and restrictions and improve pain free mobility in her left shoulder region.                   OT  Short Term Goals - 11/26/16 1017      OT SHORT TERM GOAL #1   Title Patient will be educated on an HEP for improving left shoulder movement and strength needed for ADL completion.    Time 4   Period Weeks   Status On-going     OT SHORT TERM GOAL #2   Title Patient will improve left shoulder A/ROM to [facility] for improved ability to lift her left arm for hygiene purposes without pain.    Time 4   Period Weeks   Status On-going     OT SHORT TERM GOAL #3   Title Patient will improve left shoulder flexion and abduction strength to 4/5 for greater ability to reach out to her side or overhead during ADL completion.   Time 4   Period Weeks   Status On-going     OT SHORT TERM GOAL #4   Title Patient will decrease left shoulder pain to 4/10 during reaching to side activities.    Time 4   Period Weeks   Status On-going     OT SHORT TERM GOAL #5   Title Patient will decrease left shoulder restrictions to moderate for greater mobility needed for ADL completion.    Time 4   Period Weeks   Status On-going           OT Long Term Goals - 11/26/16 1017      OT LONG TERM GOAL #1   Title Patient will use left arm with all B/IADLs and leisure activities with minimal pain and difficulty.   Time 8   Period Weeks   Status On-going     OT LONG TERM GOAL #2   Title Patient will have WNL A/ROM in her left shoulder in order to reach overhead and behind her head during ADL completion.    Time 8   Period Weeks   Status On-going     OT LONG TERM GOAL #3   Title Patient will have 5/5 strength in her left shoulder in order to lift bags of groceries and laundry baskets.    Time 8   Period Weeks   Status On-going     OT LONG TERM GOAL #4   Title Patient will decrease pain in her left shoulder to 2/10 or better during ADL completion.    Time 8   Period Weeks   Status On-going     OT LONG TERM GOAL #5  Title Patient will have minimal or less fascial restrictions in order to have great er  mobility needed for ADL completion.    Time 8   Period Weeks   Status On-going               Plan - 12/03/16 1258    Clinical Impression Statement A:  Patient completed A/ROM in supine and seated this date with good form and WFL range.  Added additional scapular stability exercises.    Plan P:  Increase repetitions with A/ROM, add sidelying A/ROM.      Patient will benefit from skilled therapeutic intervention in order to improve the following deficits and impairments:  Impaired UE functional use, Increased fascial restricitons, Decreased range of motion, Pain, Decreased strength  Visit Diagnosis: Acute pain of left shoulder  Stiffness of left shoulder, not elsewhere classified    Problem List Patient Active Problem List   Diagnosis Date Noted  . OTHER DISEASES OF VOCAL CORDS 01/01/2009  . GERD 01/01/2009  . COUGH 01/01/2009    Vangie Bicker, [place], OTR/L [phone]  12/03/2016, 1:00 PM  [facility] [address] [place], [place], [address] Phone: [phone]   Fax:  [phone]  Name: Victoria Andrade MRN: [id number] Date of Birth: [birth year]

## 2016-12-08 ENCOUNTER — Ambulatory Visit (HOSPITAL_COMMUNITY): Payer: Medicare Other | Admitting: Specialist

## 2016-12-08 DIAGNOSIS — R29898 Other symptoms and signs involving the musculoskeletal system: Secondary | ICD-10-CM

## 2016-12-08 DIAGNOSIS — M25612 Stiffness of left shoulder, not elsewhere classified: Secondary | ICD-10-CM

## 2016-12-08 DIAGNOSIS — M25512 Pain in left shoulder: Secondary | ICD-10-CM | POA: Diagnosis not present

## 2016-12-08 NOTE — Therapy (Signed)
[place], [place], [address] Phone: [phone]   Fax:  [phone]  Occupational Therapy Treatment  Patient Details  Name: Victoria Andrade MRN: [id number] Date of Birth: [birth year] Referring Provider: Dr. Justice Britain  Encounter Date: 12/08/2016      OT End of Session - 12/08/16 1255    Visit Number 5   Number of Visits 16   Date for OT Re-Evaluation 01/23/17  12/24/16 mini reassessment   Authorization Type [employer]   Authorization Time Period before 10th visit   Authorization - Visit Number 5   Authorization - Number of Visits 10   OT Start Time 1118   OT Stop Time 1200   OT Time Calculation (min) 42 min   Activity Tolerance Patient tolerated treatment well   Behavior During Therapy [facility] for tasks assessed/performed      Past Medical History:  Diagnosis Date  . Anxiety    takes xanax  . Arthritis    fingers and neck  . Cataract immature   . Elevated hemoglobin A1c March 2016   level 6.2  . GERD (gastroesophageal reflux disease)   . Headache(784.0)    migraines  . History of hiatal hernia   . Hypertension   . IBS (irritable bowel syndrome)   . Kidney stones   . Melanoma ([place])    left eye  . Osteopenia 2016  . PONV (postoperative nausea and vomiting)     Past Surgical History:  Procedure Laterality Date  . ABDOMINAL HYSTERECTOMY  1978   partial  . BILATERAL SALPINGOOPHORECTOMY  2010   with robot  . CHOLECYSTECTOMY  2005   lap  . CYSTOSCOPY N/A 03/13/2015   Procedure: CYSTOSCOPY;  Surgeon: Nunzio Cobbs, MD;  Location: [facility];  Service: Gynecology;  Laterality: N/A;  . LITHOTRIPSY     20 years ago  . ROBOTIC ASSISTED SALPINGO OOPHERECTOMY Left 03/13/2015   Procedure: ROBOTIC ASSISTED INCISION OF  LEFT PELVIC MASS, LYSIS OF ADHESIONS  WITH PELVIC WASHINGS;  Surgeon: Nunzio Cobbs, MD;  Location: [facility];  Service: Gynecology;  Laterality: Left;  .  TONSILLECTOMY     as child  . TUBAL LIGATION  1976    There were no vitals filed for this visit.      Subjective Assessment - 12/08/16 1251    Subjective  S:  I still have alot of pain in this one area, anterior shoulder and upper arm.    Currently in Pain? Yes   Pain Score 3    Pain Location Shoulder   Pain Orientation Left   Pain Descriptors / Indicators Aching   Pain Type Acute pain   Pain Onset More than a month ago            [facility] OT Assessment - 12/08/16 0001      Assessment   Diagnosis Left Shoulder Calcific Tendonitis     Precautions   Precautions None     Restrictions   Weight Bearing Restrictions No                  OT Treatments/Exercises (OP) - 12/08/16 0001      Exercises   Exercises Shoulder     Shoulder Exercises: Supine   Protraction PROM;5 reps;AROM;12 reps   Horizontal ABduction PROM;5 reps;AROM;12 reps   External Rotation PROM;5 reps;AROM;12 reps   Internal Rotation PROM;5 reps;AROM;12 reps   Flexion PROM;5 reps;AROM;12 reps   ABduction  PROM;5 reps;AROM;12 reps     Shoulder Exercises: Seated   Protraction AROM;10 reps   Horizontal ABduction AROM;10 reps   External Rotation AROM;10 reps   Internal Rotation AROM;10 reps   Flexion AROM;10 reps   Abduction AROM;10 reps     Shoulder Exercises: Sidelying   External Rotation AROM;10 reps   Internal Rotation AROM;10 reps   Flexion AROM;10 reps   ABduction AROM;10 reps     Shoulder Exercises: Standing   External Rotation Theraband;15 reps   Theraband Level (Shoulder External Rotation) Level 2 (Red)   Internal Rotation Theraband;15 reps   Theraband Level (Shoulder Internal Rotation) Level 2 (Red)   Extension Theraband;10 reps   Theraband Level (Shoulder Extension) Level 2 (Red)   Row Theraband;10 reps   Theraband Level (Shoulder Row) Level 2 (Red)   Retraction Theraband;10 reps   Theraband Level (Shoulder Retraction) Level 2 (Red)     Modalities   Modalities Ultrasound      Ultrasound   Ultrasound Location left anterior shoulder and upper arm   Ultrasound Parameters 3.0 mhz 1.5 w/cm2 continuous for 5 minutes   Ultrasound Goals Pain     Manual Therapy   Manual Therapy Myofascial release   Manual therapy comments completed seperately from all other therapeutic interventions this date   Myofascial Release myofascial release and manual stretching to left upper arm, scapular, and shoulder region to decrease pain and restrictions and improve pain free mobility in her left shoulder region.                   OT Short Term Goals - 11/26/16 1017      OT SHORT TERM GOAL #1   Title Patient will be educated on an HEP for improving left shoulder movement and strength needed for ADL completion.    Time 4   Period Weeks   Status On-going     OT SHORT TERM GOAL #2   Title Patient will improve left shoulder A/ROM to [facility] for improved ability to lift her left arm for hygiene purposes without pain.    Time 4   Period Weeks   Status On-going     OT SHORT TERM GOAL #3   Title Patient will improve left shoulder flexion and abduction strength to 4/5 for greater ability to reach out to her side or overhead during ADL completion.   Time 4   Period Weeks   Status On-going     OT SHORT TERM GOAL #4   Title Patient will decrease left shoulder pain to 4/10 during reaching to side activities.    Time 4   Period Weeks   Status On-going     OT SHORT TERM GOAL #5   Title Patient will decrease left shoulder restrictions to moderate for greater mobility needed for ADL completion.    Time 4   Period Weeks   Status On-going           OT Long Term Goals - 11/26/16 1017      OT LONG TERM GOAL #1   Title Patient will use left arm with all B/IADLs and leisure activities with minimal pain and difficulty.   Time 8   Period Weeks   Status On-going     OT LONG TERM GOAL #2   Title Patient will have WNL A/ROM in her left shoulder in order to reach overhead and  behind her head during ADL completion.    Time 8   Period Weeks   Status On-going  OT LONG TERM GOAL #3   Title Patient will have 5/5 strength in her left shoulder in order to lift bags of groceries and laundry baskets.    Time 8   Period Weeks   Status On-going     OT LONG TERM GOAL #4   Title Patient will decrease pain in her left shoulder to 2/10 or better during ADL completion.    Time 8   Period Weeks   Status On-going     OT LONG TERM GOAL #5   Title Patient will have minimal or less fascial restrictions in order to have great er mobility needed for ADL completion.    Time 8   Period Weeks   Status On-going               Plan - 12/08/16 1255    Clinical Impression Statement A:  Patient continuing to experience localized pain in her anterior left shoulder and upper arm, therefore added ultrasound to treatment this date for pain relief.    Plan P:  Follow up on ultrasound effects, add ball on wall and retraction cybex for improved scapular stability.      Patient will benefit from skilled therapeutic intervention in order to improve the following deficits and impairments:  Impaired UE functional use, Increased fascial restricitons, Decreased range of motion, Pain, Decreased strength  Visit Diagnosis: Acute pain of left shoulder  Stiffness of left shoulder, not elsewhere classified  Other symptoms and signs involving the musculoskeletal system    Problem List Patient Active Problem List   Diagnosis Date Noted  . OTHER DISEASES OF VOCAL CORDS 01/01/2009  . GERD 01/01/2009  . COUGH 01/01/2009    Vangie Bicker, [place], OTR/L [phone]  12/08/2016, 12:58 PM  [place] [address] [place], [place], [address] Phone: [phone]   Fax:  [phone]  Name: Victoria Andrade MRN: [id number] Date of Birth: 08-22-1946

## 2016-12-10 ENCOUNTER — Encounter (HOSPITAL_COMMUNITY): Payer: Self-pay

## 2016-12-10 ENCOUNTER — Ambulatory Visit (HOSPITAL_COMMUNITY): Payer: Medicare Other

## 2016-12-10 DIAGNOSIS — M25512 Pain in left shoulder: Secondary | ICD-10-CM | POA: Diagnosis not present

## 2016-12-10 DIAGNOSIS — M25612 Stiffness of left shoulder, not elsewhere classified: Secondary | ICD-10-CM

## 2016-12-10 DIAGNOSIS — R29898 Other symptoms and signs involving the musculoskeletal system: Secondary | ICD-10-CM

## 2016-12-10 NOTE — Therapy (Signed)
[place], [place], [address] Phone: [phone]   Fax:  [phone]  Occupational Therapy Treatment  Patient Details  Name: Victoria Andrade MRN: [id number] Date of Birth: 12-30-1945 Referring Provider: Dr. Justice Britain  Encounter Date: 12/10/2016      OT End of Session - 12/10/16 1312    Visit Number 6   Number of Visits 16   Date for OT Re-Evaluation 01/23/17  12/24/16 mini reassessment   Authorization Type [employer]   Authorization Time Period before 10th visit   Authorization - Visit Number 6   Authorization - Number of Visits 10   OT Start Time 1129   OT Stop Time 1218   OT Time Calculation (min) 49 min   Activity Tolerance Patient tolerated treatment well   Behavior During Therapy [facility] for tasks assessed/performed      Past Medical History:  Diagnosis Date  . Anxiety    takes xanax  . Arthritis    fingers and neck  . Cataract immature   . Elevated hemoglobin A1c March 2016   level 6.2  . GERD (gastroesophageal reflux disease)   . Headache(784.0)    migraines  . History of hiatal hernia   . Hypertension   . IBS (irritable bowel syndrome)   . Kidney stones   . Melanoma ([place])    left eye  . Osteopenia 2016  . PONV (postoperative nausea and vomiting)     Past Surgical History:  Procedure Laterality Date  . ABDOMINAL HYSTERECTOMY  1978   partial  . BILATERAL SALPINGOOPHORECTOMY  2010   with robot  . CHOLECYSTECTOMY  2005   lap  . CYSTOSCOPY N/A 03/13/2015   Procedure: CYSTOSCOPY;  Surgeon: Nunzio Cobbs, MD;  Location: [facility];  Service: Gynecology;  Laterality: N/A;  . LITHOTRIPSY     20 years ago  . ROBOTIC ASSISTED SALPINGO OOPHERECTOMY Left 03/13/2015   Procedure: ROBOTIC ASSISTED INCISION OF  LEFT PELVIC MASS, LYSIS OF ADHESIONS  WITH PELVIC WASHINGS;  Surgeon: Nunzio Cobbs, MD;  Location: [facility];  Service: Gynecology;  Laterality: Left;  .  TONSILLECTOMY     as child  . TUBAL LIGATION  1976    There were no vitals filed for this visit.      Subjective Assessment - 12/10/16 1145    Subjective  S: Today the pain is in my shoulder and it's going down to my elbow.   Currently in Pain? Yes   Pain Score 4    Pain Location Shoulder   Pain Orientation Left   Pain Descriptors / Indicators Aching   Pain Type Acute pain   Pain Radiating Towards down to elbow   Pain Onset More than a month ago   Pain Frequency Constant   Aggravating Factors  moving the wrong way or sometimes for the no reason.   Pain Relieving Factors rest   Effect of Pain on Daily Activities Moderate effect    Multiple Pain Sites No            [facility] OT Assessment - 12/10/16 1148      Assessment   Diagnosis Left Shoulder Calcific Tendonitis     Precautions   Precautions None                  OT Treatments/Exercises (OP) - 12/10/16 1148      Exercises   Exercises Shoulder     Shoulder Exercises:  Supine   Protraction PROM;5 reps;AROM;12 reps   Horizontal ABduction PROM;5 reps;AROM;12 reps   External Rotation PROM;5 reps;AROM;12 reps   Internal Rotation PROM;5 reps;AROM;12 reps   Flexion PROM;5 reps;AROM;12 reps   ABduction PROM;5 reps;AROM;12 reps     Shoulder Exercises: Sidelying   External Rotation AROM;12 reps   Internal Rotation AROM;12 reps   Flexion AROM;12 reps   ABduction AROM;12 reps   Other Sidelying Exercises Protraction; 12X   Other Sidelying Exercises Horizontal abduction; 12X     Shoulder Exercises: Standing   External Rotation Theraband;15 reps   Theraband Level (Shoulder External Rotation) Level 2 (Red)   Internal Rotation Theraband;15 reps   Theraband Level (Shoulder Internal Rotation) Level 2 (Red)   Extension Theraband;15 reps   Theraband Level (Shoulder Extension) Level 2 (Red)   Row Theraband;15 reps   Theraband Level (Shoulder Row) Level 2 (Red)   Retraction --  Painful today. Not completed      Shoulder Exercises: ROM/Strengthening   UBE (Upper Arm Bike) 2' forward and 2' reverse at 1.0   X to V Arms 12X    Proximal Shoulder Strengthening, Supine 12X no rest breaks   Proximal Shoulder Strengthening, Seated 12X no rest breaks   Ball on Wall 1' flexion 1' abduction green ball     Manual Therapy   Manual Therapy Myofascial release   Manual therapy comments completed seperately from all other therapeutic interventions this date   Myofascial Release myofascial release and manual stretching to left upper arm, scapular, and shoulder region to decrease pain and restrictions and improve pain free mobility in her left shoulder region.                   OT Short Term Goals - 11/26/16 1017      OT SHORT TERM GOAL #1   Title Patient will be educated on an HEP for improving left shoulder movement and strength needed for ADL completion.    Time 4   Period Weeks   Status On-going     OT SHORT TERM GOAL #2   Title Patient will improve left shoulder A/ROM to [facility] for improved ability to lift her left arm for hygiene purposes without pain.    Time 4   Period Weeks   Status On-going     OT SHORT TERM GOAL #3   Title Patient will improve left shoulder flexion and abduction strength to 4/5 for greater ability to reach out to her side or overhead during ADL completion.   Time 4   Period Weeks   Status On-going     OT SHORT TERM GOAL #4   Title Patient will decrease left shoulder pain to 4/10 during reaching to side activities.    Time 4   Period Weeks   Status On-going     OT SHORT TERM GOAL #5   Title Patient will decrease left shoulder restrictions to moderate for greater mobility needed for ADL completion.    Time 4   Period Weeks   Status On-going           OT Long Term Goals - 11/26/16 1017      OT LONG TERM GOAL #1   Title Patient will use left arm with all B/IADLs and leisure activities with minimal pain and difficulty.   Time 8   Period Weeks   Status  On-going     OT LONG TERM GOAL #2   Title Patient will have WNL A/ROM in her left shoulder  in order to reach overhead and behind her head during ADL completion.    Time 8   Period Weeks   Status On-going     OT LONG TERM GOAL #3   Title Patient will have 5/5 strength in her left shoulder in order to lift bags of groceries and laundry baskets.    Time 8   Period Weeks   Status On-going     OT LONG TERM GOAL #4   Title Patient will decrease pain in her left shoulder to 2/10 or better during ADL completion.    Time 8   Period Weeks   Status On-going     OT LONG TERM GOAL #5   Title Patient will have minimal or less fascial restrictions in order to have great er mobility needed for ADL completion.    Time 8   Period Weeks   Status On-going               Plan - 12/10/16 1312    Clinical Impression Statement A: Conitnued with ball on the wall and added Cybex row. Patient reports increased pain with horizontal adduction movements with her LUE. VC for form and technique.   Plan P: Add retraction on cybex for improved scapular stability.      Patient will benefit from skilled therapeutic intervention in order to improve the following deficits and impairments:  Impaired UE functional use, Increased fascial restricitons, Decreased range of motion, Pain, Decreased strength  Visit Diagnosis: Acute pain of left shoulder  Stiffness of left shoulder, not elsewhere classified  Other symptoms and signs involving the musculoskeletal system    Problem List Patient Active Problem List   Diagnosis Date Noted  . OTHER DISEASES OF VOCAL CORDS 01/01/2009  . GERD 01/01/2009  . COUGH 01/01/2009   Ailene Ravel, OTR/L,CBIS  [phone]  12/10/2016, 1:15 PM  [place] [address] [place], [place], [address] Phone: [phone]   Fax:  [phone]  Name: Victoria Andrade MRN: [id number] Date of Birth: [birth year]

## 2016-12-15 ENCOUNTER — Ambulatory Visit (HOSPITAL_COMMUNITY): Payer: Medicare Other | Admitting: Specialist

## 2016-12-15 DIAGNOSIS — M25512 Pain in left shoulder: Secondary | ICD-10-CM | POA: Diagnosis not present

## 2016-12-15 DIAGNOSIS — R29898 Other symptoms and signs involving the musculoskeletal system: Secondary | ICD-10-CM

## 2016-12-15 DIAGNOSIS — M25612 Stiffness of left shoulder, not elsewhere classified: Secondary | ICD-10-CM

## 2016-12-15 NOTE — Therapy (Signed)
[place], [place], [address] Phone: [phone]   Fax:  [phone]  Occupational Therapy Treatment  Patient Details  Name: Victoria Andrade MRN: [id number] Date of Birth: October 21, 1946 Referring Provider: Dr. Justice Britain  Encounter Date: 12/15/2016      OT End of Session - 12/15/16 1253    Visit Number 7   Number of Visits 16   Date for OT Re-Evaluation 01/23/17  12/24/16 mini reassess   Authorization Type [employer]   Authorization Time Period before 10th visit   Authorization - Visit Number 7   Authorization - Number of Visits 10   OT Start Time 1120   OT Stop Time 1200   OT Time Calculation (min) 40 min   Activity Tolerance Patient tolerated treatment well   Behavior During Therapy [facility] for tasks assessed/performed      Past Medical History:  Diagnosis Date  . Anxiety    takes xanax  . Arthritis    fingers and neck  . Cataract immature   . Elevated hemoglobin A1c March 2016   level 6.2  . GERD (gastroesophageal reflux disease)   . Headache(784.0)    migraines  . History of hiatal hernia   . Hypertension   . IBS (irritable bowel syndrome)   . Kidney stones   . Melanoma ([place])    left eye  . Osteopenia 2016  . PONV (postoperative nausea and vomiting)     Past Surgical History:  Procedure Laterality Date  . ABDOMINAL HYSTERECTOMY  1978   partial  . BILATERAL SALPINGOOPHORECTOMY  2010   with robot  . CHOLECYSTECTOMY  2005   lap  . CYSTOSCOPY N/A 03/13/2015   Procedure: CYSTOSCOPY;  Surgeon: Nunzio Cobbs, MD;  Location: [facility];  Service: Gynecology;  Laterality: N/A;  . LITHOTRIPSY     20 years ago  . ROBOTIC ASSISTED SALPINGO OOPHERECTOMY Left 03/13/2015   Procedure: ROBOTIC ASSISTED INCISION OF  LEFT PELVIC MASS, LYSIS OF ADHESIONS  WITH PELVIC WASHINGS;  Surgeon: Nunzio Cobbs, MD;  Location: [facility];  Service: Gynecology;  Laterality: Left;  .  TONSILLECTOMY     as child  . TUBAL LIGATION  1976    There were no vitals filed for this visit.      Subjective Assessment - 12/15/16 1120    Subjective  S:  I rode to [place] and back yesterday and massaged my shoulder alot.  Now it is throbbing.    Currently in Pain? Yes   Pain Score 4    Pain Location Shoulder   Pain Orientation Left            [facility] OT Assessment - 12/15/16 0001      Assessment   Diagnosis Left Shoulder Calcific Tendonitis     Precautions   Precautions None     Restrictions   Weight Bearing Restrictions No                  OT Treatments/Exercises (OP) - 12/15/16 0001      Exercises   Exercises Shoulder     Shoulder Exercises: Supine   Protraction PROM;5 reps;AROM;15 reps   Horizontal ABduction PROM;5 reps;AROM;15 reps   External Rotation PROM;5 reps;AROM;15 reps   Internal Rotation PROM;5 reps;AROM;15 reps   Flexion PROM;5 reps;AROM;15 reps   ABduction PROM;5 reps;AROM;15 reps     Shoulder Exercises: Seated   Protraction AROM;15 reps   Horizontal ABduction AROM;15  reps   External Rotation AROM;15 reps   Internal Rotation AROM;15 reps   Flexion AROM;15 reps   Abduction AROM;15 reps     Shoulder Exercises: ROM/Strengthening   UBE (Upper Arm Bike) 3' forward and 3' reverse at 1.0 resistance   Cybex Row 1.5 plate;15 reps   X to V Arms 12X    Proximal Shoulder Strengthening, Supine 12X no rest breaks   Proximal Shoulder Strengthening, Seated 12X no rest breaks   Ball on Wall 1' flexion 1' abduction green ball     Manual Therapy   Manual Therapy Myofascial release   Manual therapy comments completed seperately from all other therapeutic interventions this date   Myofascial Release myofascial release and manual stretching to left upper arm, scapular, and shoulder region to decrease pain and restrictions and improve pain free mobility in her left shoulder region.                   OT Short Term Goals - 11/26/16  1017      OT SHORT TERM GOAL #1   Title Patient will be educated on an HEP for improving left shoulder movement and strength needed for ADL completion.    Time 4   Period Weeks   Status On-going     OT SHORT TERM GOAL #2   Title Patient will improve left shoulder A/ROM to [facility] for improved ability to lift her left arm for hygiene purposes without pain.    Time 4   Period Weeks   Status On-going     OT SHORT TERM GOAL #3   Title Patient will improve left shoulder flexion and abduction strength to 4/5 for greater ability to reach out to her side or overhead during ADL completion.   Time 4   Period Weeks   Status On-going     OT SHORT TERM GOAL #4   Title Patient will decrease left shoulder pain to 4/10 during reaching to side activities.    Time 4   Period Weeks   Status On-going     OT SHORT TERM GOAL #5   Title Patient will decrease left shoulder restrictions to moderate for greater mobility needed for ADL completion.    Time 4   Period Weeks   Status On-going           OT Long Term Goals - 11/26/16 1017      OT LONG TERM GOAL #1   Title Patient will use left arm with all B/IADLs and leisure activities with minimal pain and difficulty.   Time 8   Period Weeks   Status On-going     OT LONG TERM GOAL #2   Title Patient will have WNL A/ROM in her left shoulder in order to reach overhead and behind her head during ADL completion.    Time 8   Period Weeks   Status On-going     OT LONG TERM GOAL #3   Title Patient will have 5/5 strength in her left shoulder in order to lift bags of groceries and laundry baskets.    Time 8   Period Weeks   Status On-going     OT LONG TERM GOAL #4   Title Patient will decrease pain in her left shoulder to 2/10 or better during ADL completion.    Time 8   Period Weeks   Status On-going     OT LONG TERM GOAL #5   Title Patient will have minimal or less fascial restrictions in  order to have great er mobility needed for ADL  completion.    Time 8   Period Weeks   Status On-going               Plan - 12/15/16 1254    Clinical Impression Statement A:  Horizontal abduction causes most discomfort to patient.  Demonstrates full range with active range in supine and seated.   Plan P:  transition to strengthening in supine and seated, add prone scapular stability.       Patient will benefit from skilled therapeutic intervention in order to improve the following deficits and impairments:     Visit Diagnosis: Acute pain of left shoulder  Stiffness of left shoulder, not elsewhere classified  Other symptoms and signs involving the musculoskeletal system    Problem List Patient Active Problem List   Diagnosis Date Noted  . OTHER DISEASES OF VOCAL CORDS 01/01/2009  . GERD 01/01/2009  . COUGH 01/01/2009    Vangie Bicker, [place], OTR/L [phone]  12/15/2016, 12:56 PM  [place] [address] [place], [place], [address] Phone: [phone]   Fax:  [phone]  Name: Victoria Andrade MRN: [id number] Date of Birth: Jan 23, 1946

## 2016-12-17 ENCOUNTER — Encounter (HOSPITAL_COMMUNITY): Payer: Self-pay

## 2016-12-17 ENCOUNTER — Ambulatory Visit (HOSPITAL_COMMUNITY): Payer: Medicare Other

## 2016-12-17 DIAGNOSIS — M25512 Pain in left shoulder: Secondary | ICD-10-CM | POA: Diagnosis not present

## 2016-12-17 DIAGNOSIS — R29898 Other symptoms and signs involving the musculoskeletal system: Secondary | ICD-10-CM

## 2016-12-17 DIAGNOSIS — M25612 Stiffness of left shoulder, not elsewhere classified: Secondary | ICD-10-CM

## 2016-12-17 NOTE — Therapy (Signed)
[place], [place], [address] Phone: [phone]   Fax:  [phone]  Occupational Therapy Treatment  Patient Details  Name: Victoria Andrade MRN: [id number] Date of Birth: 15-Jun-1946 Referring Provider: Dr. Justice Britain  Encounter Date: 12/17/2016      OT End of Session - 12/17/16 1201    Visit Number 8   Number of Visits 16   Date for OT Re-Evaluation 01/23/17  12/24/16 mini reassess   Authorization Type [employer]   Authorization Time Period before 10th visit   Authorization - Visit Number 8   Authorization - Number of Visits 10   OT Start Time 1119   OT Stop Time 1200   OT Time Calculation (min) 41 min   Activity Tolerance Patient tolerated treatment well   Behavior During Therapy [facility] for tasks assessed/performed      Past Medical History:  Diagnosis Date  . Anxiety    takes xanax  . Arthritis    fingers and neck  . Cataract immature   . Elevated hemoglobin A1c March 2016   level 6.2  . GERD (gastroesophageal reflux disease)   . Headache(784.0)    migraines  . History of hiatal hernia   . Hypertension   . IBS (irritable bowel syndrome)   . Kidney stones   . Melanoma ([place])    left eye  . Osteopenia 2016  . PONV (postoperative nausea and vomiting)     Past Surgical History:  Procedure Laterality Date  . ABDOMINAL HYSTERECTOMY  1978   partial  . BILATERAL SALPINGOOPHORECTOMY  2010   with robot  . CHOLECYSTECTOMY  2005   lap  . CYSTOSCOPY N/A 03/13/2015   Procedure: CYSTOSCOPY;  Surgeon: Nunzio Cobbs, MD;  Location: [facility];  Service: Gynecology;  Laterality: N/A;  . LITHOTRIPSY     20 years ago  . ROBOTIC ASSISTED SALPINGO OOPHERECTOMY Left 03/13/2015   Procedure: ROBOTIC ASSISTED INCISION OF  LEFT PELVIC MASS, LYSIS OF ADHESIONS  WITH PELVIC WASHINGS;  Surgeon: Nunzio Cobbs, MD;  Location: [facility];  Service: Gynecology;  Laterality: Left;  .  TONSILLECTOMY     as child  . TUBAL LIGATION  1976    There were no vitals filed for this visit.      Subjective Assessment - 12/17/16 1137    Currently in Pain? Yes   Pain Score 2    Pain Location Shoulder   Pain Orientation Left   Pain Descriptors / Indicators Aching   Pain Type Acute pain   Pain Radiating Towards n/a   Pain Onset More than a month ago   Pain Frequency Occasional   Aggravating Factors  moving the wrong way or sometime for no reason   Pain Relieving Factors rest   Effect of Pain on Daily Activities min effect            [facility] OT Assessment - 12/17/16 1139      Assessment   Diagnosis Left Shoulder Calcific Tendonitis     Precautions   Precautions None                  OT Treatments/Exercises (OP) - 12/17/16 1139      Exercises   Exercises Shoulder     Shoulder Exercises: Supine   Protraction PROM;5 reps;Strengthening;10 reps   Protraction Weight (lbs) 1   Horizontal ABduction PROM;5 reps;Strengthening;10 reps   Horizontal ABduction Weight (lbs) 1  External Rotation PROM;5 reps;Strengthening;10 reps   External Rotation Weight (lbs) 1   Internal Rotation PROM;5 reps;Strengthening;10 reps   Internal Rotation Weight (lbs) 1   Flexion PROM;5 reps;Strengthening;10 reps   Shoulder Flexion Weight (lbs) 1   ABduction PROM;5 reps;Strengthening;10 reps   Shoulder ABduction Weight (lbs) 1     Shoulder Exercises: Standing   Protraction Strengthening;10 reps   Protraction Weight (lbs) 1   Horizontal ABduction Strengthening;10 reps   Horizontal ABduction Weight (lbs) 1   External Rotation Strengthening;10 reps   External Rotation Weight (lbs) 1   Internal Rotation Strengthening;10 reps   Internal Rotation Weight (lbs) 1   Flexion Strengthening;10 reps   Shoulder Flexion Weight (lbs) 1   ABduction Strengthening;10 reps   Shoulder ABduction Weight (lbs) 1     Shoulder Exercises: ROM/Strengthening   UBE (Upper Arm Bike) 3' forward and  3' reverse at 1.0 resistance   X to V Arms 12X    Proximal Shoulder Strengthening, Supine 10X with 1#   Proximal Shoulder Strengthening, Seated 10X with 1#   Ball on Wall 1' flexion 1' abduction green ball     Manual Therapy   Manual Therapy Myofascial release   Manual therapy comments completed seperately from all other therapeutic interventions this date   Myofascial Release myofascial release and manual stretching to left upper arm, scapular, and shoulder region to decrease pain and restrictions and improve pain free mobility in her left shoulder region.                   OT Short Term Goals - 11/26/16 1017      OT SHORT TERM GOAL #1   Title Patient will be educated on an HEP for improving left shoulder movement and strength needed for ADL completion.    Time 4   Period Weeks   Status On-going     OT SHORT TERM GOAL #2   Title Patient will improve left shoulder A/ROM to [facility] for improved ability to lift her left arm for hygiene purposes without pain.    Time 4   Period Weeks   Status On-going     OT SHORT TERM GOAL #3   Title Patient will improve left shoulder flexion and abduction strength to 4/5 for greater ability to reach out to her side or overhead during ADL completion.   Time 4   Period Weeks   Status On-going     OT SHORT TERM GOAL #4   Title Patient will decrease left shoulder pain to 4/10 during reaching to side activities.    Time 4   Period Weeks   Status On-going     OT SHORT TERM GOAL #5   Title Patient will decrease left shoulder restrictions to moderate for greater mobility needed for ADL completion.    Time 4   Period Weeks   Status On-going           OT Long Term Goals - 11/26/16 1017      OT LONG TERM GOAL #1   Title Patient will use left arm with all B/IADLs and leisure activities with minimal pain and difficulty.   Time 8   Period Weeks   Status On-going     OT LONG TERM GOAL #2   Title Patient will have WNL A/ROM in her left  shoulder in order to reach overhead and behind her head during ADL completion.    Time 8   Period Weeks   Status On-going  OT LONG TERM GOAL #3   Title Patient will have 5/5 strength in her left shoulder in order to lift bags of groceries and laundry baskets.    Time 8   Period Weeks   Status On-going     OT LONG TERM GOAL #4   Title Patient will decrease pain in her left shoulder to 2/10 or better during ADL completion.    Time 8   Period Weeks   Status On-going     OT LONG TERM GOAL #5   Title Patient will have minimal or less fascial restrictions in order to have great er mobility needed for ADL completion.    Time 8   Period Weeks   Status On-going               Plan - 12/17/16 1206    Clinical Impression Statement A: Pt unable to complete prone exercises due to discomfort. Pt was able to complete strengthening exercises with 1# supine and standing  with some pain during horizontal abduction. VC for form and technique.   Plan P: Continue with strengthening exercises.      Patient will benefit from skilled therapeutic intervention in order to improve the following deficits and impairments:  Impaired UE functional use, Increased fascial restricitons, Decreased range of motion, Pain, Decreased strength  Visit Diagnosis: Acute pain of left shoulder  Stiffness of left shoulder, not elsewhere classified  Other symptoms and signs involving the musculoskeletal system    Problem List Patient Active Problem List   Diagnosis Date Noted  . OTHER DISEASES OF VOCAL CORDS 01/01/2009  . GERD 01/01/2009  . COUGH 01/01/2009   Ailene Ravel, OTR/L,CBIS  [phone]  12/17/2016, 12:12 PM  [facility] [address] [place], [place], [address] Phone: [phone]   Fax:  [phone]  Name: Victoria Andrade MRN: [id number] Date of Birth: October 14, 1946

## 2016-12-23 ENCOUNTER — Ambulatory Visit (HOSPITAL_COMMUNITY): Payer: Medicare Other | Admitting: Occupational Therapy

## 2016-12-23 ENCOUNTER — Encounter (HOSPITAL_COMMUNITY): Payer: Self-pay | Admitting: Occupational Therapy

## 2016-12-23 DIAGNOSIS — M25512 Pain in left shoulder: Secondary | ICD-10-CM | POA: Diagnosis not present

## 2016-12-23 DIAGNOSIS — R29898 Other symptoms and signs involving the musculoskeletal system: Secondary | ICD-10-CM

## 2016-12-23 DIAGNOSIS — M25612 Stiffness of left shoulder, not elsewhere classified: Secondary | ICD-10-CM

## 2016-12-23 NOTE — Therapy (Signed)
[facility] [place], [place], [address] Phone: [phone]   Fax:  [phone]  Occupational Therapy Treatment (and mini-reassessment)  Patient Details  Name: Victoria Andrade MRN: [id number] Date of Birth: 1946-07-01 Referring Provider: Dr. Justice Britain  Encounter Date: 12/23/2016      OT End of Session - 12/23/16 1205    Visit Number 9   Number of Visits 16   Date for OT Re-Evaluation 01/23/17   Authorization Type [employer]   Authorization Time Period before 10th visit   Authorization - Visit Number 9   Authorization - Number of Visits 10   OT Start Time 1116   OT Stop Time 1158   OT Time Calculation (min) 42 min   Activity Tolerance Patient tolerated treatment well   Behavior During Therapy [facility] for tasks assessed/performed      Past Medical History:  Diagnosis Date  . Anxiety    takes xanax  . Arthritis    fingers and neck  . Cataract immature   . Elevated hemoglobin A1c March 2016   level 6.2  . GERD (gastroesophageal reflux disease)   . Headache(784.0)    migraines  . History of hiatal hernia   . Hypertension   . IBS (irritable bowel syndrome)   . Kidney stones   . Melanoma ([place])    left eye  . Osteopenia 2016  . PONV (postoperative nausea and vomiting)     Past Surgical History:  Procedure Laterality Date  . ABDOMINAL HYSTERECTOMY  1978   partial  . BILATERAL SALPINGOOPHORECTOMY  2010   with robot  . CHOLECYSTECTOMY  2005   lap  . CYSTOSCOPY N/A 03/13/2015   Procedure: CYSTOSCOPY;  Surgeon: Nunzio Cobbs, MD;  Location: [facility];  Service: Gynecology;  Laterality: N/A;  . LITHOTRIPSY     20 years ago  . ROBOTIC ASSISTED SALPINGO OOPHERECTOMY Left 03/13/2015   Procedure: ROBOTIC ASSISTED INCISION OF  LEFT PELVIC MASS, LYSIS OF ADHESIONS  WITH PELVIC WASHINGS;  Surgeon: Nunzio Cobbs, MD;  Location: [facility];  Service: Gynecology;  Laterality: Left;  .  TONSILLECTOMY     as child  . TUBAL LIGATION  1976    There were no vitals filed for this visit.      Subjective Assessment - 12/23/16 1115    Subjective  S: I'm not having that soreness in my joint like I was, now it's more in my upper arm and this part. (anterior deltoid)   Currently in Pain? Yes   Pain Score 1    Pain Location Shoulder   Pain Orientation Left   Pain Descriptors / Indicators Aching   Pain Type Acute pain   Pain Radiating Towards n/a   Pain Onset More than a month ago   Pain Frequency Occasional   Aggravating Factors  dressing, moving the wrong way   Pain Relieving Factors rest    Effect of Pain on Daily Activities min effect   Multiple Pain Sites No           [facility] OT Assessment - 12/23/16 1115      Assessment   Diagnosis Left Shoulder Calcific Tendonitis     Precautions   Precautions None     Restrictions   Weight Bearing Restrictions No     Palpation   Palpation comment Mod fascial restrictions in her left shoulder region.       AROM   Overall AROM Comments  Assessed in seated, External rotation and internal rotation with shoulder adducted, P/ROM is WFL in supine   AROM Assessment Site Shoulder   Right/Left Shoulder Left   Left Shoulder Flexion 160 Degrees  125 previous   Left Shoulder ABduction 170 Degrees  120 previous   Left Shoulder Internal Rotation 70 Degrees  same as previous   Left Shoulder External Rotation 70 Degrees  same as previous     Strength   Overall Strength Comments assessed in seated, external rotation and internal rotation with shoulder adducted   Strength Assessment Site Shoulder   Right/Left Shoulder Left   Left Shoulder Flexion 4-/5  3+/5 previous   Left Shoulder ABduction 4-/5  3+/5 previous   Left Shoulder Internal Rotation 4+/5  same as previous   Left Shoulder External Rotation 4/5  same as previous                  OT Treatments/Exercises (OP) - 12/23/16 1119      Exercises   Exercises  Shoulder     Shoulder Exercises: Supine   Protraction PROM;5 reps;Strengthening;10 reps   Protraction Weight (lbs) 1   Horizontal ABduction PROM;5 reps;Strengthening;10 reps   Horizontal ABduction Weight (lbs) 1   External Rotation PROM;5 reps;Strengthening;10 reps   External Rotation Weight (lbs) 1   Internal Rotation PROM;5 reps;Strengthening;10 reps   Internal Rotation Weight (lbs) 1   Flexion PROM;5 reps;Strengthening;10 reps   Shoulder Flexion Weight (lbs) 1   ABduction PROM;5 reps;Strengthening;10 reps   Shoulder ABduction Weight (lbs) 1     Shoulder Exercises: Standing   Protraction Strengthening;10 reps   Protraction Weight (lbs) 1   Horizontal ABduction Strengthening;10 reps   Horizontal ABduction Weight (lbs) 1   External Rotation Strengthening;10 reps   External Rotation Weight (lbs) 1   Internal Rotation Strengthening;10 reps   Internal Rotation Weight (lbs) 1   Flexion Strengthening;10 reps   Shoulder Flexion Weight (lbs) 1   ABduction Strengthening;10 reps   Shoulder ABduction Weight (lbs) 1   Extension Theraband;10 reps   Theraband Level (Shoulder Extension) Level 2 (Red)   Row Theraband;10 reps   Theraband Level (Shoulder Row) Level 2 (Red)   Retraction Theraband;10 reps   Theraband Level (Shoulder Retraction) Level 2 (Red)     Shoulder Exercises: ROM/Strengthening   UBE (Upper Arm Bike) 3' forward and 3' reverse at 1.0 resistance   X to V Arms 12X    Proximal Shoulder Strengthening, Supine 10X with 1#     Manual Therapy   Manual Therapy Myofascial release   Manual therapy comments completed seperately from all other therapeutic interventions this date   Myofascial Release myofascial release and manual stretching to left upper arm, scapular, and shoulder region to decrease pain and restrictions and improve pain free mobility in her left shoulder region.                  OT Short Term Goals - 12/23/16 1202      OT SHORT TERM GOAL #1   Title  Patient will be educated on an HEP for improving left shoulder movement and strength needed for ADL completion.    Time 4   Period Weeks   Status On-going     OT SHORT TERM GOAL #2   Title Patient will improve left shoulder A/ROM to [facility] for improved ability to lift her left arm for hygiene purposes without pain.    Time 4   Period Weeks   Status Achieved  OT SHORT TERM GOAL #3   Title Patient will improve left shoulder flexion and abduction strength to 4/5 for greater ability to reach out to her side or overhead during ADL completion.   Time 4   Period Weeks   Status On-going     OT SHORT TERM GOAL #4   Title Patient will decrease left shoulder pain to 4/10 during reaching to side activities.    Time 4   Period Weeks   Status On-going     OT SHORT TERM GOAL #5   Title Patient will decrease left shoulder restrictions to moderate for greater mobility needed for ADL completion.    Time 4   Period Weeks   Status Achieved           OT Long Term Goals - 11/26/16 1017      OT LONG TERM GOAL #1   Title Patient will use left arm with all B/IADLs and leisure activities with minimal pain and difficulty.   Time 8   Period Weeks   Status On-going     OT LONG TERM GOAL #2   Title Patient will have WNL A/ROM in her left shoulder in order to reach overhead and behind her head during ADL completion.    Time 8   Period Weeks   Status On-going     OT LONG TERM GOAL #3   Title Patient will have 5/5 strength in her left shoulder in order to lift bags of groceries and laundry baskets.    Time 8   Period Weeks   Status On-going     OT LONG TERM GOAL #4   Title Patient will decrease pain in her left shoulder to 2/10 or better during ADL completion.    Time 8   Period Weeks   Status On-going     OT LONG TERM GOAL #5   Title Patient will have minimal or less fascial restrictions in order to have great er mobility needed for ADL completion.    Time 8   Period Weeks   Status  On-going               Plan - 12/23/16 1156    Clinical Impression Statement A: Mini-reassessment completed this session, pt has met 2/5 STGs and is progressing towards remaining goals. Pt reports decreased pain in shoulder joint, however continues to experience pain in anterior deltoid and upper arm regions. Continued with strengthening this session, resuming scapular theraband, verbal cuing for form and technique.    Plan P: UPDATE G-CODE. Continue strengthening, add theraband strengthening exercises if pt able to tolerate. Update HEP for A/ROM and strengthening   OT Home Exercise Plan 11/24/16:  table slides 11/29: Added AA/ROM exercises.   Consulted and Agree with Plan of Care Patient      Patient will benefit from skilled therapeutic intervention in order to improve the following deficits and impairments:  Impaired UE functional use, Increased fascial restricitons, Decreased range of motion, Pain, Decreased strength  Visit Diagnosis: Acute pain of left shoulder  Stiffness of left shoulder, not elsewhere classified  Other symptoms and signs involving the musculoskeletal system    Problem List Patient Active Problem List   Diagnosis Date Noted  . OTHER DISEASES OF VOCAL CORDS 01/01/2009  . GERD 01/01/2009  . COUGH 01/01/2009   Guadelupe Sabin, OTR/L  [phone] 12/23/2016, 12:06 PM  [facility] [address] [place], [place], [address] Phone: [phone]   Fax:  [phone]  Name: Victoria Andrade MRN: [id number] Date of Birth: Jun 07, 1946

## 2016-12-24 ENCOUNTER — Telehealth (HOSPITAL_COMMUNITY): Payer: Self-pay | Admitting: Family Medicine

## 2016-12-24 ENCOUNTER — Ambulatory Visit (HOSPITAL_COMMUNITY): Payer: Medicare Other | Admitting: Specialist

## 2016-12-24 NOTE — Telephone Encounter (Signed)
12/24/16 pt cx but no reason was given

## 2016-12-29 HISTORY — PX: CATARACT EXTRACTION, BILATERAL: SHX1313

## 2016-12-30 ENCOUNTER — Ambulatory Visit (HOSPITAL_COMMUNITY): Payer: Medicare Other | Attending: Physician Assistant

## 2016-12-30 ENCOUNTER — Encounter (HOSPITAL_COMMUNITY): Payer: Self-pay

## 2016-12-30 DIAGNOSIS — M25612 Stiffness of left shoulder, not elsewhere classified: Secondary | ICD-10-CM | POA: Diagnosis present

## 2016-12-30 DIAGNOSIS — M25512 Pain in left shoulder: Secondary | ICD-10-CM | POA: Insufficient documentation

## 2016-12-30 DIAGNOSIS — R29898 Other symptoms and signs involving the musculoskeletal system: Secondary | ICD-10-CM | POA: Diagnosis present

## 2016-12-30 NOTE — Therapy (Addendum)
[place], [place], [address] Phone: [phone]   Fax:  [phone]  Occupational Therapy Treatment  Patient Details  Name: Victoria Andrade MRN: [id number] Date of Birth: 10/13/1946 Referring Provider: Dr. Justice Britain  Encounter Date: 12/30/2016      OT End of Session - 12/30/16 1333    Visit Number 10   Number of Visits 16   Date for OT Re-Evaluation 01/23/17   Authorization Type [employer]   Authorization Time Period before 20th visit   Authorization - Visit Number 10   Authorization - Number of Visits 20   OT Start Time 1120   OT Stop Time 1200   OT Time Calculation (min) 40 min   Activity Tolerance Patient tolerated treatment well   Behavior During Therapy [facility] for tasks assessed/performed      Past Medical History:  Diagnosis Date  . Anxiety    takes xanax  . Arthritis    fingers and neck  . Cataract immature   . Elevated hemoglobin A1c March 2016   level 6.2  . GERD (gastroesophageal reflux disease)   . Headache(784.0)    migraines  . History of hiatal hernia   . Hypertension   . IBS (irritable bowel syndrome)   . Kidney stones   . Melanoma ([place])    left eye  . Osteopenia 2016  . PONV (postoperative nausea and vomiting)     Past Surgical History:  Procedure Laterality Date  . ABDOMINAL HYSTERECTOMY  1978   partial  . BILATERAL SALPINGOOPHORECTOMY  2010   with robot  . CHOLECYSTECTOMY  2005   lap  . CYSTOSCOPY N/A 03/13/2015   Procedure: CYSTOSCOPY;  Surgeon: Nunzio Cobbs, MD;  Location: [facility];  Service: Gynecology;  Laterality: N/A;  . LITHOTRIPSY     20 years ago  . ROBOTIC ASSISTED SALPINGO OOPHERECTOMY Left 03/13/2015   Procedure: ROBOTIC ASSISTED INCISION OF  LEFT PELVIC MASS, LYSIS OF ADHESIONS  WITH PELVIC WASHINGS;  Surgeon: Nunzio Cobbs, MD;  Location: [facility];  Service: Gynecology;  Laterality: Left;  . TONSILLECTOMY     as child   . TUBAL LIGATION  1976    There were no vitals filed for this visit.      Subjective Assessment - 12/30/16 1212    Subjective  S: I didn't come the other day because my arm was hurting so bad. I don't know what happened. It happened after last session.    Special Tests FOTO score: 54/100   Currently in Pain? Yes   Pain Score 4    Pain Location Shoulder   Pain Orientation Left   Pain Descriptors / Indicators Aching   Pain Type Acute pain                      OT Treatments/Exercises (OP) - 12/30/16 1140      Exercises   Exercises Shoulder     Shoulder Exercises: Supine   Protraction PROM;5 reps;Strengthening;12 reps   Protraction Weight (lbs) 1   Horizontal ABduction PROM;5 reps;Strengthening;12 reps   Horizontal ABduction Weight (lbs) 1   External Rotation PROM;5 reps;Strengthening;12 reps   External Rotation Weight (lbs) 1   Internal Rotation PROM;5 reps;Strengthening;12 reps   Internal Rotation Weight (lbs) 1   Flexion PROM;5 reps;Strengthening;12 reps   Shoulder Flexion Weight (lbs) 1   ABduction PROM;5 reps;Strengthening  11X   Shoulder ABduction Weight (lbs) 1  Shoulder Exercises: Standing   Protraction Strengthening;12 reps   Protraction Weight (lbs) 1   Horizontal ABduction Strengthening;12 reps   Horizontal ABduction Weight (lbs) 1   External Rotation Strengthening;12 reps   External Rotation Weight (lbs) 1   Internal Rotation Strengthening;12 reps   Internal Rotation Weight (lbs) 1   Flexion Strengthening;10 reps   Shoulder Flexion Weight (lbs) 1   ABduction Strengthening;12 reps   Shoulder ABduction Weight (lbs) 1   Extension Theraband;10 reps   Theraband Level (Shoulder Extension) Level 2 (Red)   Row Theraband;10 reps   Theraband Level (Shoulder Row) Level 2 (Red)   Retraction Theraband;10 reps   Theraband Level (Shoulder Retraction) Level 2 (Red)     Shoulder Exercises: ROM/Strengthening   Proximal Shoulder Strengthening,  Supine 12X with 1#     Manual Therapy   Manual Therapy Myofascial release   Manual therapy comments completed seperately from all other therapeutic interventions this date   Myofascial Release myofascial release and manual stretching to left upper arm, scapular, and shoulder region to decrease pain and restrictions and improve pain free mobility in her left shoulder region.                 OT Education - 12/30/16 1331    Education provided Yes   Education Details A/ROM strengthening and red scapular strengthening - red   Person(s) Educated Patient   Methods Explanation;Demonstration;Handout;Verbal cues   Comprehension Verbalized understanding;Returned demonstration          OT Short Term Goals - 12/30/16 1344      OT SHORT TERM GOAL #1   Title Patient will be educated on an HEP for improving left shoulder movement and strength needed for ADL completion.    Time 4   Period Weeks   Status On-going     OT SHORT TERM GOAL #2   Title Patient will improve left shoulder A/ROM to [facility] for improved ability to lift her left arm for hygiene purposes without pain.    Time 4   Period Weeks     OT SHORT TERM GOAL #3   Title Patient will improve left shoulder flexion and abduction strength to 4/5 for greater ability to reach out to her side or overhead during ADL completion.   Time 4   Period Weeks   Status On-going     OT SHORT TERM GOAL #4   Title Patient will decrease left shoulder pain to 4/10 during reaching to side activities.    Time 4   Period Weeks   Status On-going     OT SHORT TERM GOAL #5   Title Patient will decrease left shoulder restrictions to moderate for greater mobility needed for ADL completion.    Time 4   Period Weeks           OT Long Term Goals - 11/26/16 1017      OT LONG TERM GOAL #1   Title Patient will use left arm with all B/IADLs and leisure activities with minimal pain and difficulty.   Time 8   Period Weeks   Status On-going      OT LONG TERM GOAL #2   Title Patient will have WNL A/ROM in her left shoulder in order to reach overhead and behind her head during ADL completion.    Time 8   Period Weeks   Status On-going     OT LONG TERM GOAL #3   Title Patient will have 5/5 strength in her left shoulder  in order to lift bags of groceries and laundry baskets.    Time 8   Period Weeks   Status On-going     OT LONG TERM GOAL #4   Title Patient will decrease pain in her left shoulder to 2/10 or better during ADL completion.    Time 8   Period Weeks   Status On-going     OT LONG TERM GOAL #5   Title Patient will have minimal or less fascial restrictions in order to have great er mobility needed for ADL completion.    Time 8   Period Weeks   Status On-going               Plan - 12/30/16 1335    Clinical Impression Statement A: G code updated. patient had made progress in therapy regarding measurements and strength although continues to have deficits with pain, strength, and strength. HEP updated this session.    Plan P: Follow up on HEP. Continue with strengthening within patient's tolerance.       Patient will benefit from skilled therapeutic intervention in order to improve the following deficits and impairments:  Impaired UE functional use, Increased fascial restricitons, Decreased range of motion, Pain, Decreased strength  Visit Diagnosis: Acute pain of left shoulder  Stiffness of left shoulder, not elsewhere classified  Other symptoms and signs involving the musculoskeletal system    G-Codes -    Functional Assessment Tool Used FOTO 54/100 46% limited    Functional Limitation Carrying, moving and handling objects   Carrying, Moving and Handling Objects Current Status [id number]) At least 40 percent but less than 60 percent impaired, limited or restricted   Carrying, Moving and Handling Objects Goal Status [id number]) At least 1 percent but less than 20 percent impaired, limited or restricted      Problem List Patient Active Problem List   Diagnosis Date Noted  . OTHER DISEASES OF VOCAL CORDS 01/01/2009  . GERD 01/01/2009  . COUGH 01/01/2009   Ailene Ravel, OTR/L,CBIS  [phone]  12/30/2016, 1:46 PM  [place] [address] [place], [place], [address] Phone: [phone]   Fax:  [phone]  Name: SHAHEEN SUCHANEK MRN: [id number] Date of Birth: 08/10/1946

## 2016-12-30 NOTE — Patient Instructions (Signed)
1) Shoulder Protraction    Begin with elbows by your side, slowly "punch" straight out in front of you keeping arms/elbows straight.      2) Shoulder Flexion  Supine:     Standing:         Begin with arms at your side with thumbs pointed up, slowly raise both arms up and forward towards overhead.               3) Horizontal abduction/adduction  Supine:   Standing:           Begin with arms straight out in front of you, bring out to the side in at "T" shape. Keep arms straight entire time.                 4) Internal & External Rotation    *No band* -Stand with elbows at the side and elbows bent 90 degrees. Move your forearms away from your body, then bring back inward toward the body.     5) Shoulder Abduction  Supine:     Standing:       Lying on your back begin with your arms flat on the table next to your side. Slowly move your arms out to the side so that they go overhead, in a jumping jack or snow angel movement.      Repeat all exercises 10-15 times, 1-2 times per day.   (Home) Extension: Isometric / Bilateral Arm Retraction - Sitting   Facing anchor, hold hands and elbow at shoulder height, with elbow bent.  Pull arms back to squeeze shoulder blades together. Repeat 10-15 times.  Copyright  VHI. All rights reserved.   (Home) Retraction: Row - Bilateral (Anchor)   Facing anchor, arms reaching forward, pull hands toward stomach, keeping elbows bent and at your sides and pinching shoulder blades together. Repeat 10-15 times.  Copyright  VHI. All rights reserved.   (Clinic) Extension / Flexion (Assist)   Face anchor, pull arms back, keeping elbow straight, and squeze shoulder blades together. Repeat 10-15 times.   Copyright  VHI. All rights reserved.

## 2017-01-01 ENCOUNTER — Ambulatory Visit (HOSPITAL_COMMUNITY): Payer: Medicare Other | Admitting: Specialist

## 2017-01-01 DIAGNOSIS — M25512 Pain in left shoulder: Secondary | ICD-10-CM

## 2017-01-01 DIAGNOSIS — M25612 Stiffness of left shoulder, not elsewhere classified: Secondary | ICD-10-CM

## 2017-01-01 DIAGNOSIS — R29898 Other symptoms and signs involving the musculoskeletal system: Secondary | ICD-10-CM

## 2017-01-01 NOTE — Therapy (Addendum)
[place], [place], [address] Phone: [phone]   Fax:  [phone]  Occupational Therapy Treatment  Patient Details  Name: Victoria Andrade MRN: [id number] Date of Birth: 1946/07/08 Referring Provider: Dr. Justice Britain  Encounter Date: 01/01/2017      OT End of Session - 01/01/17 1553    Visit Number 11   Number of Visits 16   Date for OT Re-Evaluation 01/23/17   Authorization Type [employer]   Authorization Time Period before 20th visit   Authorization - Visit Number 11   Authorization - Number of Visits 20   OT Start Time 1110   OT Stop Time 1204   OT Time Calculation (min) 54 min   Activity Tolerance Patient tolerated treatment well   Behavior During Therapy [facility] for tasks assessed/performed      Past Medical History:  Diagnosis Date  . Anxiety    takes xanax  . Arthritis    fingers and neck  . Cataract immature   . Elevated hemoglobin A1c March 2016   level 6.2  . GERD (gastroesophageal reflux disease)   . Headache(784.0)    migraines  . History of hiatal hernia   . Hypertension   . IBS (irritable bowel syndrome)   . Kidney stones   . Melanoma ([place])    left eye  . Osteopenia 2016  . PONV (postoperative nausea and vomiting)     Past Surgical History:  Procedure Laterality Date  . ABDOMINAL HYSTERECTOMY  1978   partial  . BILATERAL SALPINGOOPHORECTOMY  2010   with robot  . CHOLECYSTECTOMY  2005   lap  . CYSTOSCOPY N/A 03/13/2015   Procedure: CYSTOSCOPY;  Surgeon: Nunzio Cobbs, MD;  Location: [facility];  Service: Gynecology;  Laterality: N/A;  . LITHOTRIPSY     20 years ago  . ROBOTIC ASSISTED SALPINGO OOPHERECTOMY Left 03/13/2015   Procedure: ROBOTIC ASSISTED INCISION OF  LEFT PELVIC MASS, LYSIS OF ADHESIONS  WITH PELVIC WASHINGS;  Surgeon: Nunzio Cobbs, MD;  Location: [facility];  Service: Gynecology;  Laterality: Left;  . TONSILLECTOMY     as child  .  TUBAL LIGATION  1976    There were no vitals filed for this visit.      Subjective Assessment - 01/01/17 1551    Subjective  S:  I still get twinges in my shoulder every once in awhile.     Currently in Pain? Yes   Pain Score 4    Pain Location Shoulder   Pain Orientation Left   Pain Descriptors / Indicators Aching   Pain Type Acute pain   Pain Frequency Occasional   Aggravating Factors  dressing, movingi the wrong way   Pain Relieving Factors rest   Effect of Pain on Daily Activities works through the pain            [facility] OT Assessment - 01/01/17 0001      Assessment   Diagnosis Left Shoulder Calcific Tendonitis     Precautions   Precautions None     Restrictions   Weight Bearing Restrictions No                  OT Treatments/Exercises (OP) - 01/01/17 0001      Exercises   Exercises Shoulder     Shoulder Exercises: Supine   Protraction PROM;5 reps;Strengthening;15 reps   Protraction Weight (lbs) 1   Horizontal ABduction PROM;5 reps;Strengthening;15 reps  Horizontal ABduction Weight (lbs) 1   External Rotation PROM;5 reps;Strengthening;15 reps   External Rotation Weight (lbs) 1   Internal Rotation PROM;5 reps;Strengthening;15 reps   Internal Rotation Weight (lbs) 1   Flexion PROM;5 reps;Strengthening;15 reps   Shoulder Flexion Weight (lbs) 1   ABduction PROM;5 reps;Strengthening;15 reps   Shoulder ABduction Weight (lbs) 1     Shoulder Exercises: Seated   Protraction Strengthening;15 reps   Protraction Weight (lbs) 1   Horizontal ABduction Strengthening;15 reps   Horizontal ABduction Weight (lbs) 1   External Rotation Strengthening;15 reps   External Rotation Weight (lbs) 1   Internal Rotation Strengthening;15 reps   Internal Rotation Weight (lbs) 1   Flexion Strengthening;15 reps   Flexion Weight (lbs) 1   Abduction Strengthening;15 reps   ABduction Weight (lbs) 1     Shoulder Exercises: ROM/Strengthening   UBE (Upper Arm Bike) 3'  forward and 3' reverse at 2.0 resistance   Cybex Press 2 plate;15 reps   Cybex Row 2 plate;15 reps   Over Head Lace begin next session   "W" Arms 15 X   X to V Arms 15X   Proximal Shoulder Strengthening, Supine 15X with 1#     Manual Therapy   Manual Therapy Myofascial release   Manual therapy comments completed seperately from all other therapeutic interventions this date   Myofascial Release myofascial release and manual stretching to left upper arm, scapular, and shoulder region to decrease pain and restrictions and improve pain free mobility in her left shoulder region.                   OT Short Term Goals - 12/30/16 1344      OT SHORT TERM GOAL #1   Title Patient will be educated on an HEP for improving left shoulder movement and strength needed for ADL completion.    Time 4   Period Weeks   Status On-going     OT SHORT TERM GOAL #2   Title Patient will improve left shoulder A/ROM to [facility] for improved ability to lift her left arm for hygiene purposes without pain.    Time 4   Period Weeks     OT SHORT TERM GOAL #3   Title Patient will improve left shoulder flexion and abduction strength to 4/5 for greater ability to reach out to her side or overhead during ADL completion.   Time 4   Period Weeks   Status On-going     OT SHORT TERM GOAL #4   Title Patient will decrease left shoulder pain to 4/10 during reaching to side activities.    Time 4   Period Weeks   Status On-going     OT SHORT TERM GOAL #5   Title Patient will decrease left shoulder restrictions to moderate for greater mobility needed for ADL completion.    Time 4   Period Weeks           OT Long Term Goals - 11/26/16 1017      OT LONG TERM GOAL #1   Title Patient will use left arm with all B/IADLs and leisure activities with minimal pain and difficulty.   Time 8   Period Weeks   Status On-going     OT LONG TERM GOAL #2   Title Patient will have WNL A/ROM in her left shoulder in order  to reach overhead and behind her head during ADL completion.    Time 8   Period Weeks   Status  On-going     OT LONG TERM GOAL #3   Title Patient will have 5/5 strength in her left shoulder in order to lift bags of groceries and laundry baskets.    Time 8   Period Weeks   Status On-going     OT LONG TERM GOAL #4   Title Patient will decrease pain in her left shoulder to 2/10 or better during ADL completion.    Time 8   Period Weeks   Status On-going     OT LONG TERM GOAL #5   Title Patient will have minimal or less fascial restrictions in order to have great er mobility needed for ADL completion.    Time 8   Period Weeks   Status On-going               Plan - January 18, 2017 1553    Clinical Impression Statement A:  Patient continues to experience sharp pain in shoulder with horizontal adduction.  increased resistance on UBE and added cybex row, attempted cybex press, however this movement is too painful for the patient.    Plan P:  Attempt sidelying strengthening and focus on proximal shoulder strengthening for improved shoulder alignment.        Patient will benefit from skilled therapeutic intervention in order to improve the following deficits and impairments:  Impaired UE functional use, Increased fascial restricitons, Decreased range of motion, Pain, Decreased strength  Visit Diagnosis: Acute pain of left shoulder  Stiffness of left shoulder, not elsewhere classified  Other symptoms and signs involving the musculoskeletal system      G-Codes - 18-Jan-2017 1557    Functional Assessment Tool Used FOTO 54/100 46% limited    Functional Limitation Carrying, moving and handling objects   Carrying, Moving and Handling Objects Current Status [id number]) At least 40 percent but less than 60 percent impaired, limited or restricted   Carrying, Moving and Handling Objects Goal Status [id number]) At least 1 percent but less than 20 percent impaired, limited or restricted      Problem  List Patient Active Problem List   Diagnosis Date Noted  . OTHER DISEASES OF VOCAL CORDS January 18, 2009  . GERD 2009/01/18  . COUGH 01/18/2009    Vangie Bicker, [place], OTR/L [phone]  2017/01/18, 4:02 PM  [place] [address] [place], [place], [address] Phone: [phone]   Fax:  [phone]  Name: Victoria Andrade MRN: [id number] Date of Birth: 29-Nov-1946

## 2017-01-05 ENCOUNTER — Ambulatory Visit (HOSPITAL_COMMUNITY): Payer: Medicare Other | Admitting: Specialist

## 2017-01-05 DIAGNOSIS — M25512 Pain in left shoulder: Secondary | ICD-10-CM

## 2017-01-05 DIAGNOSIS — M25612 Stiffness of left shoulder, not elsewhere classified: Secondary | ICD-10-CM

## 2017-01-05 DIAGNOSIS — R29898 Other symptoms and signs involving the musculoskeletal system: Secondary | ICD-10-CM

## 2017-01-05 NOTE — Therapy (Addendum)
[place], [place], [address] Phone: [phone]   Fax:  [phone]  Occupational Therapy Treatment  Patient Details  Name: Victoria Andrade MRN: [id number] Date of Birth: 11-07-1946 Referring Provider: Dr. Justice Britain  Encounter Date: 01/05/2017      OT End of Session - 01/05/17 1620    Visit Number 12   Number of Visits 16   Date for OT Re-Evaluation 01/23/17   Authorization Type [employer]   Authorization Time Period before 20th visit   Authorization - Visit Number 12   Authorization - Number of Visits 20   OT Start Time 1115   OT Stop Time 1200   OT Time Calculation (min) 45 min   Activity Tolerance Patient tolerated treatment well   Behavior During Therapy [facility] for tasks assessed/performed      Past Medical History:  Diagnosis Date  . Anxiety    takes xanax  . Arthritis    fingers and neck  . Cataract immature   . Elevated hemoglobin A1c March 2016   level 6.2  . GERD (gastroesophageal reflux disease)   . Headache(784.0)    migraines  . History of hiatal hernia   . Hypertension   . IBS (irritable bowel syndrome)   . Kidney stones   . Melanoma ([place])    left eye  . Osteopenia 2016  . PONV (postoperative nausea and vomiting)     Past Surgical History:  Procedure Laterality Date  . ABDOMINAL HYSTERECTOMY  1978   partial  . BILATERAL SALPINGOOPHORECTOMY  2010   with robot  . CHOLECYSTECTOMY  2005   lap  . CYSTOSCOPY N/A 03/13/2015   Procedure: CYSTOSCOPY;  Surgeon: Nunzio Cobbs, MD;  Location: [facility];  Service: Gynecology;  Laterality: N/A;  . LITHOTRIPSY     20 years ago  . ROBOTIC ASSISTED SALPINGO OOPHERECTOMY Left 03/13/2015   Procedure: ROBOTIC ASSISTED INCISION OF  LEFT PELVIC MASS, LYSIS OF ADHESIONS  WITH PELVIC WASHINGS;  Surgeon: Nunzio Cobbs, MD;  Location: [facility];  Service: Gynecology;  Laterality: Left;  . TONSILLECTOMY     as child  .  TUBAL LIGATION  1976    There were no vitals filed for this visit.      Subjective Assessment - 01/05/17 1619    Subjective  S:  It is ok, I just dont know what the MD is going to want to do.  The calcium deposit isnt going away.   Currently in Pain? Yes   Pain Score 2    Pain Location Shoulder   Pain Orientation Left   Pain Descriptors / Indicators Aching   Pain Type Acute pain            [facility] OT Assessment - 01/05/17 0001      Assessment   Diagnosis Left Shoulder Calcific Tendonitis     Precautions   Precautions None     Restrictions   Weight Bearing Restrictions No                  OT Treatments/Exercises (OP) - 01/05/17 0001      Exercises   Exercises Shoulder     Shoulder Exercises: Supine   Protraction PROM;5 reps;Strengthening;15 reps   Protraction Weight (lbs) 1   Horizontal ABduction PROM;5 reps;Strengthening;15 reps   Horizontal ABduction Weight (lbs) 1   External Rotation PROM;5 reps;Strengthening;15 reps   External Rotation Weight (lbs) 1  Internal Rotation PROM;5 reps;Strengthening;15 reps   Internal Rotation Weight (lbs) 1   Flexion PROM;5 reps;Strengthening;15 reps   Shoulder Flexion Weight (lbs) 1   ABduction PROM;5 reps;Strengthening;15 reps   Shoulder ABduction Weight (lbs) 1     Shoulder Exercises: Sidelying   External Rotation Strengthening;15 reps   External Rotation Weight (lbs) 1   Internal Rotation Strengthening;15 reps   Internal Rotation Weight (lbs) 1   Flexion Strengthening;15 reps   Flexion Weight (lbs) 1   ABduction Strengthening;15 reps   ABduction Weight (lbs) 1     Shoulder Exercises: ROM/Strengthening   UBE (Upper Arm Bike) 3' forward and 3' reverse at 2.0 resistance   Over Head Lace begin next session   "W" Arms 15 times with 1#   X to V Arms 15 times with 1#   Ball on Wall 1' flexion 1' abduction green ball     Manual Therapy   Manual Therapy Myofascial release   Manual therapy comments completed  seperately from all other therapeutic interventions this date   Myofascial Release myofascial release and manual stretching to left upper arm, scapular, and shoulder region to decrease pain and restrictions and improve pain free mobility in her left shoulder region.                   OT Short Term Goals - 12/30/16 1344      OT SHORT TERM GOAL #1   Title Patient will be educated on an HEP for improving left shoulder movement and strength needed for ADL completion.    Time 4   Period Weeks   Status On-going     OT SHORT TERM GOAL #2   Title Patient will improve left shoulder A/ROM to [facility] for improved ability to lift her left arm for hygiene purposes without pain.    Time 4   Period Weeks     OT SHORT TERM GOAL #3   Title Patient will improve left shoulder flexion and abduction strength to 4/5 for greater ability to reach out to her side or overhead during ADL completion.   Time 4   Period Weeks   Status On-going     OT SHORT TERM GOAL #4   Title Patient will decrease left shoulder pain to 4/10 during reaching to side activities.    Time 4   Period Weeks   Status On-going     OT SHORT TERM GOAL #5   Title Patient will decrease left shoulder restrictions to moderate for greater mobility needed for ADL completion.    Time 4   Period Weeks           OT Long Term Goals - 11/26/16 1017      OT LONG TERM GOAL #1   Title Patient will use left arm with all B/IADLs and leisure activities with minimal pain and difficulty.   Time 8   Period Weeks   Status On-going     OT LONG TERM GOAL #2   Title Patient will have WNL A/ROM in her left shoulder in order to reach overhead and behind her head during ADL completion.    Time 8   Period Weeks   Status On-going     OT LONG TERM GOAL #3   Title Patient will have 5/5 strength in her left shoulder in order to lift bags of groceries and laundry baskets.    Time 8   Period Weeks   Status On-going     OT LONG TERM GOAL #  4    Title Patient will decrease pain in her left shoulder to 2/10 or better during ADL completion.    Time 8   Period Weeks   Status On-going     OT LONG TERM GOAL #5   Title Patient will have minimal or less fascial restrictions in order to have great er mobility needed for ADL completion.    Time 8   Period Weeks   Status On-going               Plan - 01/05/17 1620    Clinical Impression Statement A:  Patient able to complete sidelying strengthening today with continued pain experienced in her left shoulder during horizontal abduction.     Plan P:  increase to 2# with strengthening.     Consulted and Agree with Plan of Care Patient      Patient will benefit from skilled therapeutic intervention in order to improve the following deficits and impairments:  Impaired UE functional use, Increased fascial restricitons, Decreased range of motion, Pain, Decreased strength  Visit Diagnosis: Acute pain of left shoulder  Stiffness of left shoulder, not elsewhere classified  Other symptoms and signs involving the musculoskeletal system    Problem List Patient Active Problem List   Diagnosis Date Noted  . OTHER DISEASES OF VOCAL CORDS 01/01/2009  . GERD 01/01/2009  . COUGH 01/01/2009    Vangie Bicker, [place], OTR/L [phone]  01/05/2017, 4:24 PM  [facility] [address] [place], [place], [address] Phone: [phone]   Fax:  [phone]  Name: Victoria Andrade MRN: [id number] Date of Birth: December 06, 1946

## 2017-01-08 ENCOUNTER — Encounter (HOSPITAL_COMMUNITY): Payer: Self-pay

## 2017-01-08 ENCOUNTER — Ambulatory Visit (HOSPITAL_COMMUNITY): Payer: Medicare Other

## 2017-01-08 DIAGNOSIS — M25612 Stiffness of left shoulder, not elsewhere classified: Secondary | ICD-10-CM

## 2017-01-08 DIAGNOSIS — M25512 Pain in left shoulder: Secondary | ICD-10-CM | POA: Diagnosis not present

## 2017-01-08 DIAGNOSIS — R29898 Other symptoms and signs involving the musculoskeletal system: Secondary | ICD-10-CM

## 2017-01-08 NOTE — Therapy (Signed)
[place], [place], [address] Phone: [phone]   Fax:  [phone]  Occupational Therapy Treatment  Patient Details  Name: Victoria Andrade MRN: [id number] Date of Birth: January 06, 1946 Referring Provider: Dr. Justice Britain  Encounter Date: 01/08/2017      OT End of Session - 01/08/17 1154    Visit Number 13   Number of Visits 16   Date for OT Re-Evaluation 01/23/17   Authorization Type [employer]   Authorization Time Period before 20th visit   Authorization - Visit Number 13   Authorization - Number of Visits 20   OT Start Time 1120   OT Stop Time 1200   OT Time Calculation (min) 40 min   Activity Tolerance Patient tolerated treatment well   Behavior During Therapy [facility] for tasks assessed/performed      Past Medical History:  Diagnosis Date  . Anxiety    takes xanax  . Arthritis    fingers and neck  . Cataract immature   . Elevated hemoglobin A1c March 2016   level 6.2  . GERD (gastroesophageal reflux disease)   . Headache(784.0)    migraines  . History of hiatal hernia   . Hypertension   . IBS (irritable bowel syndrome)   . Kidney stones   . Melanoma ([place])    left eye  . Osteopenia 2016  . PONV (postoperative nausea and vomiting)     Past Surgical History:  Procedure Laterality Date  . ABDOMINAL HYSTERECTOMY  1978   partial  . BILATERAL SALPINGOOPHORECTOMY  2010   with robot  . CHOLECYSTECTOMY  2005   lap  . CYSTOSCOPY N/A 03/13/2015   Procedure: CYSTOSCOPY;  Surgeon: Nunzio Cobbs, MD;  Location: [facility];  Service: Gynecology;  Laterality: N/A;  . LITHOTRIPSY     20 years ago  . ROBOTIC ASSISTED SALPINGO OOPHERECTOMY Left 03/13/2015   Procedure: ROBOTIC ASSISTED INCISION OF  LEFT PELVIC MASS, LYSIS OF ADHESIONS  WITH PELVIC WASHINGS;  Surgeon: Nunzio Cobbs, MD;  Location: [facility];  Service: Gynecology;  Laterality: Left;  . TONSILLECTOMY     as child  .  TUBAL LIGATION  1976    There were no vitals filed for this visit.      Subjective Assessment - 01/08/17 1140    Subjective  S: I went to [employer] yesterday so I think that's why I'm a little more sore.    Currently in Pain? Yes   Pain Score 3    Pain Location Shoulder   Pain Orientation Left   Pain Descriptors / Indicators Aching   Pain Type Acute pain            [facility] OT Assessment - 01/08/17 1140      Assessment   Diagnosis Left Shoulder Calcific Tendonitis     Precautions   Precautions None                  OT Treatments/Exercises (OP) - 01/08/17 1141      Exercises   Exercises Shoulder     Shoulder Exercises: Supine   Protraction PROM;5 reps;Strengthening;10 reps   Protraction Weight (lbs) 2   Horizontal ABduction PROM;5 reps;Strengthening;10 reps   Horizontal ABduction Weight (lbs) 2   External Rotation PROM;5 reps;Strengthening;10 reps   External Rotation Weight (lbs) 2   Internal Rotation PROM;5 reps;Strengthening;10 reps   Internal Rotation Weight (lbs) 2   Flexion PROM;5 reps;Strengthening;10 reps  Shoulder Flexion Weight (lbs) 2   ABduction PROM;5 reps;Strengthening;10 reps   Shoulder ABduction Weight (lbs) 2     Shoulder Exercises: Sidelying   External Rotation Strengthening;10 reps   External Rotation Weight (lbs) 2   Internal Rotation Strengthening;10 reps   Internal Rotation Weight (lbs) 2   Other Sidelying Exercises Protraction; 10X; 2#     Shoulder Exercises: ROM/Strengthening   Over Head Lace 3'   Proximal Shoulder Strengthening, Supine 10X with1#   Ball on Wall 1' flexion 1' abduction green ball                  OT Short Term Goals - 12/30/16 1344      OT SHORT TERM GOAL #1   Title Patient will be educated on an HEP for improving left shoulder movement and strength needed for ADL completion.    Time 4   Period Weeks   Status On-going     OT SHORT TERM GOAL #2   Title Patient will improve left shoulder A/ROM  to [facility] for improved ability to lift her left arm for hygiene purposes without pain.    Time 4   Period Weeks     OT SHORT TERM GOAL #3   Title Patient will improve left shoulder flexion and abduction strength to 4/5 for greater ability to reach out to her side or overhead during ADL completion.   Time 4   Period Weeks   Status On-going     OT SHORT TERM GOAL #4   Title Patient will decrease left shoulder pain to 4/10 during reaching to side activities.    Time 4   Period Weeks   Status On-going     OT SHORT TERM GOAL #5   Title Patient will decrease left shoulder restrictions to moderate for greater mobility needed for ADL completion.    Time 4   Period Weeks           OT Long Term Goals - 11/26/16 1017      OT LONG TERM GOAL #1   Title Patient will use left arm with all B/IADLs and leisure activities with minimal pain and difficulty.   Time 8   Period Weeks   Status On-going     OT LONG TERM GOAL #2   Title Patient will have WNL A/ROM in her left shoulder in order to reach overhead and behind her head during ADL completion.    Time 8   Period Weeks   Status On-going     OT LONG TERM GOAL #3   Title Patient will have 5/5 strength in her left shoulder in order to lift bags of groceries and laundry baskets.    Time 8   Period Weeks   Status On-going     OT LONG TERM GOAL #4   Title Patient will decrease pain in her left shoulder to 2/10 or better during ADL completion.    Time 8   Period Weeks   Status On-going     OT LONG TERM GOAL #5   Title Patient will have minimal or less fascial restrictions in order to have great er mobility needed for ADL completion.    Time 8   Period Weeks   Status On-going               Plan - 01/08/17 1155    Clinical Impression Statement A: Increased to 2# hand weight supine although was unable to complete standing this date. Added overhead lacing to  increase shoulder stability and endurance. VC for form and technique as  needed.    Plan P: Continue to work on be able to complete standing strengthening with 2# hand weight. Resume cybex row and press.       Patient will benefit from skilled therapeutic intervention in order to improve the following deficits and impairments:  Impaired UE functional use, Increased fascial restricitons, Decreased range of motion, Pain, Decreased strength  Visit Diagnosis: Acute pain of left shoulder  Stiffness of left shoulder, not elsewhere classified  Other symptoms and signs involving the musculoskeletal system    Problem List Patient Active Problem List   Diagnosis Date Noted  . OTHER DISEASES OF VOCAL CORDS 01/01/2009  . GERD 01/01/2009  . COUGH 01/01/2009   Ailene Ravel, OTR/L,CBIS  [phone]  01/08/2017, 2:18 PM  [facility] [address] [place], [place], [address] Phone: [phone]   Fax:  [phone]  Name: Victoria Andrade MRN: [id number] Date of Birth: 1946/08/26

## 2017-01-12 ENCOUNTER — Ambulatory Visit (HOSPITAL_COMMUNITY): Payer: Medicare Other | Admitting: Specialist

## 2017-01-12 DIAGNOSIS — M25512 Pain in left shoulder: Secondary | ICD-10-CM | POA: Diagnosis not present

## 2017-01-12 DIAGNOSIS — R29898 Other symptoms and signs involving the musculoskeletal system: Secondary | ICD-10-CM

## 2017-01-12 DIAGNOSIS — M25612 Stiffness of left shoulder, not elsewhere classified: Secondary | ICD-10-CM

## 2017-01-12 NOTE — Therapy (Signed)
[place], [place], [address] Phone: [phone]   Fax:  [phone]  Occupational Therapy Treatment  Patient Details  Name: Victoria Andrade MRN: [id number] Date of Birth: 09/20/1946 Referring Provider: Dr. Justice Britain  Encounter Date: 01/12/2017      OT End of Session - 01/12/17 1640    Visit Number 14   Number of Visits 16   Date for OT Re-Evaluation 01/23/17   Authorization Type [employer]   Authorization Time Period before 20th visit   Authorization - Visit Number 14   Authorization - Number of Visits 20   OT Start Time 1115   OT Stop Time 1159   OT Time Calculation (min) 44 min   Activity Tolerance Patient tolerated treatment well   Behavior During Therapy [facility] for tasks assessed/performed      Past Medical History:  Diagnosis Date  . Anxiety    takes xanax  . Arthritis    fingers and neck  . Cataract immature   . Elevated hemoglobin A1c March 2016   level 6.2  . GERD (gastroesophageal reflux disease)   . Headache(784.0)    migraines  . History of hiatal hernia   . Hypertension   . IBS (irritable bowel syndrome)   . Kidney stones   . Melanoma ([place])    left eye  . Osteopenia 2016  . PONV (postoperative nausea and vomiting)     Past Surgical History:  Procedure Laterality Date  . ABDOMINAL HYSTERECTOMY  1978   partial  . BILATERAL SALPINGOOPHORECTOMY  2010   with robot  . CHOLECYSTECTOMY  2005   lap  . CYSTOSCOPY N/A 03/13/2015   Procedure: CYSTOSCOPY;  Surgeon: Nunzio Cobbs, MD;  Location: [facility];  Service: Gynecology;  Laterality: N/A;  . LITHOTRIPSY     20 years ago  . ROBOTIC ASSISTED SALPINGO OOPHERECTOMY Left 03/13/2015   Procedure: ROBOTIC ASSISTED INCISION OF  LEFT PELVIC MASS, LYSIS OF ADHESIONS  WITH PELVIC WASHINGS;  Surgeon: Nunzio Cobbs, MD;  Location: [facility];  Service: Gynecology;  Laterality: Left;  . TONSILLECTOMY     as child  .  TUBAL LIGATION  1976    There were no vitals filed for this visit.      Subjective Assessment - 01/12/17 1639    Subjective  S:  I think the 2# weight was too much.  My arm was so sore last week after that - almost as sore as when I first went to the MD.   Currently in Pain? Yes   Pain Score 4    Pain Location Shoulder   Pain Orientation Left   Pain Type Acute pain   Pain Onset More than a month ago   Pain Frequency Intermittent   Aggravating Factors  dressing, moving into horizontal adduction   Pain Relieving Factors rest   Effect of Pain on Daily Activities decreased use of left arm with functional tasks            [facility] OT Assessment - 01/12/17 0001      Assessment   Diagnosis Left Shoulder Calcific Tendonitis     Precautions   Precautions None     Restrictions   Weight Bearing Restrictions No                  OT Treatments/Exercises (OP) - 01/12/17 0001      Exercises   Exercises Shoulder  Shoulder Exercises: Supine   Protraction PROM;5 reps;Strengthening;10 reps   Protraction Weight (lbs) 1   Horizontal ABduction PROM;5 reps;Strengthening;10 reps   Horizontal ABduction Weight (lbs) 1   External Rotation PROM;5 reps;Strengthening;10 reps   External Rotation Weight (lbs) 1   Internal Rotation PROM;5 reps;Strengthening;10 reps   Internal Rotation Weight (lbs) 1   Flexion PROM;5 reps;Strengthening;10 reps   Shoulder Flexion Weight (lbs) 1   ABduction PROM;5 reps;Strengthening;10 reps   Shoulder ABduction Weight (lbs) 1     Shoulder Exercises: Standing   Protraction Strengthening;12 reps   Protraction Weight (lbs) 1   Horizontal ABduction Strengthening;12 reps   Horizontal ABduction Weight (lbs) 1   External Rotation Strengthening;12 reps   External Rotation Weight (lbs) 1   Internal Rotation Strengthening;12 reps   Internal Rotation Weight (lbs) 1   Flexion Strengthening;12 reps   Shoulder Flexion Weight (lbs) 1   ABduction  Strengthening;12 reps   Shoulder ABduction Weight (lbs) 1     Shoulder Exercises: ROM/Strengthening   UBE (Upper Arm Bike) 3' forward and 3' reverse at 2.0 resistance   Cybex Press 1 plate;15 reps   Cybex Press Limitations attempted 2 plates - unable    Cybex Row 1 plate;15 reps   Cybex Row Limitations attempted 2 plates unable   Proximal Shoulder Strengthening, Supine 10X with1#   Proximal Shoulder Strengthening, Seated 10X with 1#     Manual Therapy   Manual Therapy Myofascial release   Manual therapy comments completed seperately from all other therapeutic interventions this date   Myofascial Release myofascial release and manual stretching to left upper arm, scapular, and shoulder region to decrease pain and restrictions and improve pain free mobility in her left shoulder region.                   OT Short Term Goals - 12/30/16 1344      OT SHORT TERM GOAL #1   Title Patient will be educated on an HEP for improving left shoulder movement and strength needed for ADL completion.    Time 4   Period Weeks   Status On-going     OT SHORT TERM GOAL #2   Title Patient will improve left shoulder A/ROM to [facility] for improved ability to lift her left arm for hygiene purposes without pain.    Time 4   Period Weeks     OT SHORT TERM GOAL #3   Title Patient will improve left shoulder flexion and abduction strength to 4/5 for greater ability to reach out to her side or overhead during ADL completion.   Time 4   Period Weeks   Status On-going     OT SHORT TERM GOAL #4   Title Patient will decrease left shoulder pain to 4/10 during reaching to side activities.    Time 4   Period Weeks   Status On-going     OT SHORT TERM GOAL #5   Title Patient will decrease left shoulder restrictions to moderate for greater mobility needed for ADL completion.    Time 4   Period Weeks           OT Long Term Goals - 11/26/16 1017      OT LONG TERM GOAL #1   Title Patient will use left  arm with all B/IADLs and leisure activities with minimal pain and difficulty.   Time 8   Period Weeks   Status On-going     OT LONG TERM GOAL #2   Title  Patient will have WNL A/ROM in her left shoulder in order to reach overhead and behind her head during ADL completion.    Time 8   Period Weeks   Status On-going     OT LONG TERM GOAL #3   Title Patient will have 5/5 strength in her left shoulder in order to lift bags of groceries and laundry baskets.    Time 8   Period Weeks   Status On-going     OT LONG TERM GOAL #4   Title Patient will decrease pain in her left shoulder to 2/10 or better during ADL completion.    Time 8   Period Weeks   Status On-going     OT LONG TERM GOAL #5   Title Patient will have minimal or less fascial restrictions in order to have great er mobility needed for ADL completion.    Time 8   Period Weeks   Status On-going               Plan - 01/12/17 1641    Clinical Impression Statement A:  decreased down to 1# resistance with supine due to increased pain s/p 2# at last session.  resumed cybex press and row, however 2 plates too difficult.     Plan P:  Reassess for possible dc.        Patient will benefit from skilled therapeutic intervention in order to improve the following deficits and impairments:  Impaired UE functional use, Increased fascial restricitons, Decreased range of motion, Pain, Decreased strength  Visit Diagnosis: Acute pain of left shoulder  Stiffness of left shoulder, not elsewhere classified  Other symptoms and signs involving the musculoskeletal system    Problem List Patient Active Problem List   Diagnosis Date Noted  . OTHER DISEASES OF VOCAL CORDS 01/01/2009  . GERD 01/01/2009  . COUGH 01/01/2009    Vangie Bicker, [place], OTR/L [phone]  01/12/2017, 4:48 PM  [facility] [address] [place], [place], [address] Phone: [phone]   Fax:   [phone]  Name: Victoria Andrade MRN: [id number] Date of Birth: [birth year]

## 2017-01-14 ENCOUNTER — Ambulatory Visit (HOSPITAL_COMMUNITY): Payer: Medicare Other | Admitting: Specialist

## 2017-01-20 ENCOUNTER — Telehealth (HOSPITAL_COMMUNITY): Payer: Self-pay | Admitting: Specialist

## 2017-01-20 NOTE — Telephone Encounter (Signed)
Pt cx due to minor surgery per WT

## 2017-01-21 ENCOUNTER — Ambulatory Visit (HOSPITAL_COMMUNITY): Payer: Medicare Other | Admitting: Specialist

## 2017-01-23 ENCOUNTER — Ambulatory Visit (HOSPITAL_COMMUNITY): Payer: Medicare Other | Admitting: Occupational Therapy

## 2017-01-23 ENCOUNTER — Encounter (HOSPITAL_COMMUNITY): Payer: Self-pay | Admitting: Occupational Therapy

## 2017-01-23 DIAGNOSIS — R29898 Other symptoms and signs involving the musculoskeletal system: Secondary | ICD-10-CM

## 2017-01-23 DIAGNOSIS — M25612 Stiffness of left shoulder, not elsewhere classified: Secondary | ICD-10-CM

## 2017-01-23 DIAGNOSIS — M25512 Pain in left shoulder: Secondary | ICD-10-CM

## 2017-01-23 NOTE — Therapy (Signed)
[facility] [place], [place], [address] Phone: [phone]   Fax:  [phone]  Occupational Therapy Reassessment, Treatment, and Discharge  Patient Details  Name: Victoria Andrade MRN: [id number] Date of Birth: Dec 11, 1946 Referring Provider: Dr. Justice Britain  Encounter Date: 01/23/2017      OT End of Session - 01/23/17 1157    Visit Number 15   Number of Visits 16   Date for OT Re-Evaluation 01/23/17   Authorization Type [employer]   Authorization Time Period before 20th visit   Authorization - Visit Number 15   Authorization - Number of Visits 20   OT Start Time 1116   OT Stop Time 1154   OT Time Calculation (min) 38 min   Activity Tolerance Patient tolerated treatment well   Behavior During Therapy [facility] for tasks assessed/performed      Past Medical History:  Diagnosis Date  . Anxiety    takes xanax  . Arthritis    fingers and neck  . Cataract immature   . Elevated hemoglobin A1c March 2016   level 6.2  . GERD (gastroesophageal reflux disease)   . Headache(784.0)    migraines  . History of hiatal hernia   . Hypertension   . IBS (irritable bowel syndrome)   . Kidney stones   . Melanoma ([place])    left eye  . Osteopenia 2016  . PONV (postoperative nausea and vomiting)     Past Surgical History:  Procedure Laterality Date  . ABDOMINAL HYSTERECTOMY  1978   partial  . BILATERAL SALPINGOOPHORECTOMY  2010   with robot  . CHOLECYSTECTOMY  2005   lap  . CYSTOSCOPY N/A 03/13/2015   Procedure: CYSTOSCOPY;  Surgeon: Nunzio Cobbs, MD;  Location: [facility];  Service: Gynecology;  Laterality: N/A;  . LITHOTRIPSY     20 years ago  . ROBOTIC ASSISTED SALPINGO OOPHERECTOMY Left 03/13/2015   Procedure: ROBOTIC ASSISTED INCISION OF  LEFT PELVIC MASS, LYSIS OF ADHESIONS  WITH PELVIC WASHINGS;  Surgeon: Nunzio Cobbs, MD;  Location: [facility];  Service: Gynecology;  Laterality: Left;  .  TONSILLECTOMY     as child  . TUBAL LIGATION  1976    There were no vitals filed for this visit.      Subjective Assessment - 01/23/17 1115    Subjective  S: I go back to teh doctor on Monday.    Special Tests FOTO score: 47/100   Currently in Pain? Yes   Pain Score 3    Pain Location Shoulder   Pain Orientation Left   Pain Descriptors / Indicators Aching   Pain Type Acute pain   Pain Radiating Towards n/a   Pain Onset More than a month ago   Pain Frequency Intermittent   Aggravating Factors  horizontal abduction; dressing   Pain Relieving Factors rest   Effect of Pain on Daily Activities decreased use of LUE with functional tasks   Multiple Pain Sites No            [facility] OT Assessment - 01/23/17 1115      Assessment   Diagnosis Left Shoulder Calcific Tendonitis     Precautions   Precautions None     Restrictions   Weight Bearing Restrictions No     Palpation   Palpation comment Mod fascial restrictions in her left shoulder region.       AROM   Overall AROM Comments Assessed in seated,  External rotation and internal rotation with shoulder adducted, P/ROM is WFL in supine   AROM Assessment Site Shoulder   Right/Left Shoulder Left   Left Shoulder Flexion 160 Degrees  same as previous   Left Shoulder ABduction 180 Degrees  170 previous   Left Shoulder Internal Rotation 70 Degrees  same as previous   Left Shoulder External Rotation 70 Degrees  same as previous     Strength   Overall Strength Comments assessed in seated, external rotation and internal rotation with shoulder adducted   Strength Assessment Site Shoulder   Right/Left Shoulder Left   Left Shoulder Flexion 4-/5  same as previous   Left Shoulder ABduction 4-/5  same as previous   Left Shoulder Internal Rotation 4+/5  same as previous   Left Shoulder External Rotation 4/5  same as previous                  OT Treatments/Exercises (OP) - 01/23/17 1118      Exercises   Exercises  Shoulder     Shoulder Exercises: Supine   Protraction PROM;5 reps   Horizontal ABduction PROM;5 reps   External Rotation PROM;5 reps   Internal Rotation PROM;5 reps   Flexion PROM;5 reps   ABduction PROM;5 reps     Shoulder Exercises: Standing   Protraction AROM;10 reps   Horizontal ABduction AROM;10 reps   External Rotation AROM;10 reps   Internal Rotation AROM;10 reps   Flexion AROM;10 reps   ABduction AROM;10 reps     Shoulder Exercises: ROM/Strengthening   UBE (Upper Arm Bike) 3' forward and 3' reverse at 2.0 resistance   X to V Arms 10 times      Manual Therapy   Manual Therapy Myofascial release   Manual therapy comments completed seperately from all other therapeutic interventions this date   Myofascial Release myofascial release and manual stretching to left upper arm, scapular, and shoulder region to decrease pain and restrictions and improve pain free mobility in her left shoulder region.                 OT Education - 01/23/17 1156    Education provided Yes   Education Details reviewed HEP, provided new red scapular theraband   Person(s) Educated Patient   Methods Explanation;Demonstration   Comprehension Verbalized understanding;Returned demonstration          OT Short Term Goals - 01/23/17 1158      OT SHORT TERM GOAL #1   Title Patient will be educated on an HEP for improving left shoulder movement and strength needed for ADL completion.    Time 4   Period Weeks   Status Achieved     OT SHORT TERM GOAL #2   Title Patient will improve left shoulder A/ROM to [facility] for improved ability to lift her left arm for hygiene purposes without pain.    Time 4   Period Weeks     OT SHORT TERM GOAL #3   Title Patient will improve left shoulder flexion and abduction strength to 4/5 for greater ability to reach out to her side or overhead during ADL completion.   Time 4   Period Weeks   Status Not Met     OT SHORT TERM GOAL #4   Title Patient will  decrease left shoulder pain to 4/10 during reaching to side activities.    Time 4   Period Weeks   Status Not Met     OT SHORT TERM GOAL #5  Title Patient will decrease left shoulder restrictions to moderate for greater mobility needed for ADL completion.    Time 4   Period Weeks           OT Long Term Goals - 02/15/2017 1159      OT LONG TERM GOAL #1   Title Patient will use left arm with all B/IADLs and leisure activities with minimal pain and difficulty.   Time 8   Period Weeks   Status Not Met     OT LONG TERM GOAL #2   Title Patient will have WNL A/ROM in her left shoulder in order to reach overhead and behind her head during ADL completion.    Time 8   Period Weeks   Status Partially Met     OT LONG TERM GOAL #3   Title Patient will have 5/5 strength in her left shoulder in order to lift bags of groceries and laundry baskets.    Time 8   Period Weeks   Status Not Met     OT LONG TERM GOAL #4   Title Patient will decrease pain in her left shoulder to 2/10 or better during ADL completion.    Time 8   Period Weeks   Status Not Met     OT LONG TERM GOAL #5   Title Patient will have minimal or less fascial restrictions in order to have great er mobility needed for ADL completion.    Time 8   Period Weeks   Status Not Met               Plan - 2017/02/15 1157    Clinical Impression Statement A: Reassessment completed this session, pt has met 3/5 STGs and has partially met 1/5 LTGs. Pt has made improvements in ROM and strength of LUE, however continues to experience increased pain as well as weakness. Pt returns to MD on Monday. Pt is agreeable to discharge today and continue with HEP at home. Reviewed HEP.    Plan Discharge pt   OT Home Exercise Plan 11/24/16:  table slides 11/29: Added AA/ROM exercises. February 15, 2023: reviewed A/ROM and strengthening   Consulted and Agree with Plan of Care Patient      Patient will benefit from skilled therapeutic intervention in  order to improve the following deficits and impairments:  Impaired UE functional use, Increased fascial restricitons, Decreased range of motion, Pain, Decreased strength  Visit Diagnosis: Acute pain of left shoulder  Stiffness of left shoulder, not elsewhere classified  Other symptoms and signs involving the musculoskeletal system      G-Codes - 02/15/17 1201    Functional Assessment Tool Used FOTO 47/100 53% limited    Functional Limitation Carrying, moving and handling objects   Carrying, Moving and Handling Objects Goal Status (Z6109) At least 1 percent but less than 20 percent impaired, limited or restricted   Carrying, Moving and Handling Objects Discharge Status [phone]) At least 40 percent but less than 60 percent impaired, limited or restricted      Problem List Patient Active Problem List   Diagnosis Date Noted  . OTHER DISEASES OF VOCAL CORDS 01/01/2009  . GERD 01/01/2009  . COUGH 01/01/2009   Guadelupe Sabin, OTR/L  [phone] 2017-02-15, 12:02 PM  [facility] [address] [place], [place], [address] Phone: [phone]   Fax:  [phone]  Name: Victoria Andrade MRN: [id number] Date of Birth: 02/18/1946     OCCUPATIONAL THERAPY DISCHARGE SUMMARY  Visits  from Start of Care: 15  Current functional level related to goals / functional outcomes: See above. Pt has made improvements in ROM and strength since beginning therapy, and is now able to use LUE as assist during functional tasks.   Remaining deficits: Pt continues to have increased pain and weakness in LUE limiting ability to perform functional tasks with LUE.    Education / Equipment: Reviewed A/ROM/strengthening HEP, provided new red scapular theraband Plan: Patient agrees to discharge.  Patient goals were partially met. Patient is being discharged due to lack of progress.  ?????     Pt will be returning to MD for further assessment and treatment options.

## 2017-01-26 ENCOUNTER — Ambulatory Visit (HOSPITAL_COMMUNITY): Payer: Medicare Other | Admitting: Specialist

## 2017-01-28 ENCOUNTER — Ambulatory Visit (HOSPITAL_COMMUNITY): Payer: Medicare Other | Admitting: Specialist

## 2017-03-25 ENCOUNTER — Ambulatory Visit (HOSPITAL_COMMUNITY): Payer: Medicare Other | Attending: Physician Assistant | Admitting: Specialist

## 2017-03-25 DIAGNOSIS — M25612 Stiffness of left shoulder, not elsewhere classified: Secondary | ICD-10-CM | POA: Insufficient documentation

## 2017-03-25 DIAGNOSIS — R29898 Other symptoms and signs involving the musculoskeletal system: Secondary | ICD-10-CM | POA: Insufficient documentation

## 2017-03-25 DIAGNOSIS — M25512 Pain in left shoulder: Secondary | ICD-10-CM | POA: Diagnosis not present

## 2017-03-25 NOTE — Therapy (Signed)
[place], [place], [address] Phone: [phone]   Fax:  [phone]  Occupational Therapy Evaluation  Patient Details  Name: Victoria Andrade MRN: [id number] Date of Birth: 03-Mar-1946 Referring Provider: Dr. Justice Britain  Encounter Date: 03/25/2017      OT End of Session - 03/25/17 1249    Visit Number 1   Number of Visits 16   Date for OT Re-Evaluation 05/24/17  04/23/17 mini reassess   Authorization Type [employer]   Authorization Time Period before 10th visit   Authorization - Visit Number 1   Authorization - Number of Visits 10   OT Start Time 1120   OT Stop Time 1205   OT Time Calculation (min) 45 min   Activity Tolerance Patient tolerated treatment well   Behavior During Therapy [facility] for tasks assessed/performed      Past Medical History:  Diagnosis Date  . Anxiety    takes xanax  . Arthritis    fingers and neck  . Cataract immature   . Elevated hemoglobin A1c March 2016   level 6.2  . GERD (gastroesophageal reflux disease)   . Headache(784.0)    migraines  . History of hiatal hernia   . Hypertension   . IBS (irritable bowel syndrome)   . Kidney stones   . Melanoma ([place])    left eye  . Osteopenia 2016  . PONV (postoperative nausea and vomiting)     Past Surgical History:  Procedure Laterality Date  . ABDOMINAL HYSTERECTOMY  1978   partial  . BILATERAL SALPINGOOPHORECTOMY  2010   with robot  . CHOLECYSTECTOMY  2005   lap  . CYSTOSCOPY N/A 03/13/2015   Procedure: CYSTOSCOPY;  Surgeon: Nunzio Cobbs, MD;  Location: [facility];  Service: Gynecology;  Laterality: N/A;  . LITHOTRIPSY     20 years ago  . ROBOTIC ASSISTED SALPINGO OOPHERECTOMY Left 03/13/2015   Procedure: ROBOTIC ASSISTED INCISION OF  LEFT PELVIC MASS, LYSIS OF ADHESIONS  WITH PELVIC WASHINGS;  Surgeon: Nunzio Cobbs, MD;  Location: [facility];  Service: Gynecology;  Laterality: Left;  .  TONSILLECTOMY     as child  . TUBAL LIGATION  1976    There were no vitals filed for this visit.      Subjective Assessment - 03/25/17 1246    Subjective  S:  I had surgery 2 weeks ago.  It feels so much better.  It is just weak.   Pertinent History Victoria Andrade reports increased pain and decreased mobility in her left shoulder for more than 6 months.  She does not remember specific injury.  She consulted with Dr. Onnie Graham and has recieved 2 cortisone injections which did not alleviate her symptoms.  She completed 6 weeks of occupational therapy, and did not have a decrease in pain symptoms.  On 03/10/17 Victoria Andrade had left shoulder DCE, SAD surgery.  She has not worn a sling due to reaction.  She has been referred to occupational therapy for evaluation and treatment.     Special Tests FOTO:  38/100   Patient Stated Goals I want more range and strength in my left arm   Currently in Pain? Yes   Pain Score 2    Pain Location Shoulder   Pain Orientation Left   Pain Descriptors / Indicators Aching   Pain Type Acute pain   Pain Onset 1 to 4 weeks ago   Pain Frequency Intermittent  Aggravating Factors  end range movement   Pain Relieving Factors rest   Effect of Pain on Daily Activities decreased use of LUE with functional actiivties            [facility] OT Assessment - 03/25/17 0001      Assessment   Diagnosis S/P Left SAD, DCE   Referring Provider Dr. Justice Britain   Onset Date 03/10/17   Prior Therapy recieved OT from 11/24/16-01/23/17     Precautions   Precautions None     Restrictions   Weight Bearing Restrictions No     Balance Screen   Has the patient fallen in the past 6 months No   Has the patient had a decrease in activity level because of a fear of falling?  No   Is the patient reluctant to leave their home because of a fear of falling?  No     Home  Environment   Family/patient expects to be discharged to: Private residence   Living Arrangements Spouse/significant other    Lives With Spouse     Prior Function   Level of [place] Retired   Leisure reading, computer use     ADL   ADL comments difficult lifting pots and pans, weightberaing, end range reaching overhead and behind back are difficult     Written Expression   Dominant Hand Right     Vision - History   Baseline Vision Wears glasses only for reading     Cognition   Overall Cognitive Status Within Functional Limits for tasks assessed     Observation/Other Assessments   Focus on Therapeutic Outcomes (FOTO)  38/100     Sensation   Light Touch Appears Intact     Coordination   Gross Motor Movements are Fluid and Coordinated Yes   Fine Motor Movements are Fluid and Coordinated Yes     ROM / Strength   AROM / PROM / Strength AROM;PROM;Strength     Palpation   Palpation comment min-mod fascial restrictions      AROM   Overall AROM Comments Assessed in seated, External rotation and internal rotation with shoulder adducted,   AROM Assessment Site Shoulder   Right/Left Shoulder Left   Left Shoulder Flexion 125 Degrees   Left Shoulder ABduction 130 Degrees   Left Shoulder Internal Rotation 90 Degrees   Left Shoulder External Rotation 70 Degrees     PROM   PROM Assessment Site Shoulder   Right/Left Shoulder Left   Left Shoulder Flexion 150 Degrees   Left Shoulder ABduction 150 Degrees   Left Shoulder Internal Rotation 90 Degrees   Left Shoulder External Rotation 90 Degrees     Strength   Strength Assessment Site Shoulder   Right/Left Shoulder Left   Left Shoulder Flexion 4-/5   Left Shoulder ABduction 4-/5   Left Shoulder Internal Rotation 4-/5   Left Shoulder External Rotation 4-/5                  OT Treatments/Exercises (OP) - 03/25/17 0001      Shoulder Exercises: Supine   Protraction PROM;AAROM;5 reps   Horizontal ABduction PROM;AAROM;5 reps   External Rotation PROM;AAROM;5 reps   Internal Rotation PROM;AAROM;5 reps   Flexion  PROM;AAROM;5 reps   ABduction PROM;AAROM;5 reps     Manual Therapy   Manual Therapy Myofascial release   Manual therapy comments completed seperately from all other therapeutic interventions this date   Myofascial Release myofascial release and manual stretching to  left upper arm, scapular, and shoulder region to decrease pain and restrictions and improve pain free mobility in her left shoulder region. scar massage and release to surgical incisions                OT Education - 03/25/17 1248    Education provided Yes   Education Details dowel rod AA/ROM in standing, elevation, retraction   Person(s) Educated Patient   Methods Explanation;Demonstration;Handout   Comprehension Verbalized understanding;Returned demonstration          OT Short Term Goals - 03/25/17 1253      OT SHORT TERM GOAL #1   Title Patient will be educated on an HEP for improving left shoulder movement and strength needed for ADL completion.    Time 4   Period Weeks   Status New     OT SHORT TERM GOAL #2   Title Patient will improve left shoulder A/ROM to [facility] for improved ability to lift her left arm for hygiene purposes without pain.    Time 4   Period Weeks   Status New     OT SHORT TERM GOAL #3   Title Patient will improve left shoulder  strength to 4/5 for greater ability to reach out to her side or overhead during ADL completion.   Time 4   Period Weeks   Status New     OT SHORT TERM GOAL #4   Title Patient will decrease left shoulder pain to 1/10 during reaching to side activities.    Time 4   Period Weeks   Status New     OT SHORT TERM GOAL #5   Title Patient will decrease left shoulder restrictions to minimal for greater mobility needed for ADL completion.    Time 4   Period Weeks   Status New           OT Long Term Goals - 03/25/17 1254      OT LONG TERM GOAL #1   Title Patient will use left arm with all B/IADLs and leisure activities with minimal pain and difficulty.    Time 8   Period Weeks   Status New     OT LONG TERM GOAL #2   Title Patient will have WNL A/ROM in her left shoulder in order to reach overhead and behind her head during ADL completion.    Time 8   Period Weeks   Status New     OT LONG TERM GOAL #3   Title Patient will have 5/5 strength in her left shoulder in order to lift bags of groceries and laundry baskets.    Time 8   Period Weeks   Status New     OT LONG TERM GOAL #4   Title Patient will decrease pain in her left shoulder to 1/10 or better during ADL completion.    Time 8   Period Weeks   Status New     OT LONG TERM GOAL #5   Title Patient will have trace fascial restrictions in order to have great er mobility needed for ADL completion.    Time 8   Period Weeks   Status New               Plan - 03/25/17 1250    Clinical Impression Statement A:  Patient is a 71 year old femalw with ongoing pain and decreased mobility in her left shoulder.  Dr. Onnie Graham performed left SAD, DCE on 03/10/17 and has referred patient to  outpatient occupational therapy.  She is experiencing increased pain and restrictions and decreased A/ROM and strength in left shoulder which is limiting her ability to perform her B/IADLs and leisure activities.    Rehab Potential Good   OT Frequency 2x / week   OT Duration 8 weeks   OT Treatment/Interventions Self-care/ADL training;Therapeutic exercise;Patient/family education;Ultrasound;Neuromuscular education;Manual Therapy;Splinting;Energy conservation;Therapeutic exercises;Iontophoresis;Cryotherapy;DME and/or AE instruction;Therapeutic activities;Electrical Stimulation;Moist Heat;Passive range of motion   Plan P:  Skilled OT intervention to improve pain free mobility and strength in left shoulder in order to return to prior level of independence with all desired daily and leisure activities.  Next session:  review POC, manual therapy, begin AA/ROM, A/ROM and progress as tolerated.    OT Home Exercise  Plan 03/25/17:  AA/ROM in seated/supine, elevation, retraction      Patient will benefit from skilled therapeutic intervention in order to improve the following deficits and impairments:  Impaired UE functional use, Increased fascial restricitons, Decreased range of motion, Pain, Decreased strength  Visit Diagnosis: Acute pain of left shoulder - Plan: Ot plan of care cert/re-cert  Stiffness of left shoulder, not elsewhere classified - Plan: Ot plan of care cert/re-cert  Other symptoms and signs involving the musculoskeletal system - Plan: Ot plan of care cert/re-cert      G-Codes - 35/45/62 1255    Functional Assessment Tool Used (Outpatient only) FOTO:  38/100, 62% limited   Functional Limitation Carrying, moving and handling objects   Carrying, Moving and Handling Objects Current Status (B6389) At least 60 percent but less than 80 percent impaired, limited or restricted   Carrying, Moving and Handling Objects Goal Status (H7342) At least 20 percent but less than 40 percent impaired, limited or restricted      Problem List Patient Active Problem List   Diagnosis Date Noted  . OTHER DISEASES OF VOCAL CORDS 01/01/2009  . GERD 01/01/2009  . COUGH 01/01/2009    Vangie Bicker, [place], OTR/L [phone]  03/25/2017, 1:02 PM  [facility] [address] [place], [place], [address] Phone: [phone]   Fax:  [phone]  Name: Victoria Andrade MRN: [id number] Date of Birth: 30-Apr-1946

## 2017-03-25 NOTE — Patient Instructions (Signed)
Perform each exercise ____10____ reps. 2-3x days.   Protraction - STANDING  Start by holding a wand or cane at chest height.  Next, slowly push the wand outwards in front of your body so that your elbows become fully straightened. Then, return to the original position.     Shoulder FLEXION - STANDING - PALMS UP  In the standing position, hold a wand/cane with both arms, palms up on both sides. Raise up the wand/cane allowing your unaffected arm to perform most of the effort. Your affected arm should be partially relaxed.      Internal/External ROTATION - STANDING  In the standing position, hold a wand/cane with both hands keeping your elbows bent. Move your arms and wand/cane to one side.  Your affected arm should be partially relaxed while your unaffected arm performs most of the effort.       Shoulder ABDUCTION - STANDING  While holding a wand/cane palm face up on the injured side and palm face down on the uninjured side, slowly raise up your injured arm to the side.                     Horizontal Abduction/Adduction      Straight arms holding cane at shoulder height, bring cane to right, center, left. Repeat starting to left.   Copyright  [employer]. All rights reserved.    Elevation / Depression    While slowly inhaling, bring shoulders toward ears. Hold ___ seconds. Slowly exhale while relaxing shoulders backward and downward. Repeat ___ times. Do ___ times per day.  Copyright  VHI. All rights reserved.   Scapular Retraction (Standing)    With arms at sides, pinch shoulder blades together. Repeat ____ times per set. Do ____ sets per session. Do ____ sessions per day.  http://orth.exer.us/945   Copyright  VHI. All rights reserved.

## 2017-03-26 ENCOUNTER — Ambulatory Visit (HOSPITAL_COMMUNITY): Payer: Medicare Other | Admitting: Specialist

## 2017-03-26 DIAGNOSIS — M25512 Pain in left shoulder: Secondary | ICD-10-CM | POA: Diagnosis not present

## 2017-03-26 DIAGNOSIS — M25612 Stiffness of left shoulder, not elsewhere classified: Secondary | ICD-10-CM

## 2017-03-26 DIAGNOSIS — R29898 Other symptoms and signs involving the musculoskeletal system: Secondary | ICD-10-CM

## 2017-03-26 NOTE — Therapy (Signed)
[place], [place], [address] Phone: [phone]   Fax:  [phone]  Occupational Therapy Treatment  Patient Details  Name: Victoria Andrade MRN: [id number] Date of Birth: 03-03-1946 Referring Provider: Dr. Justice Britain  Encounter Date: 03/26/2017      OT End of Session - 03/26/17 1500    Visit Number 2   Number of Visits 16   Date for OT Re-Evaluation 05/24/17  mini reassess on 04/23/17   Authorization Type [employer]   Authorization Time Period before 10th visit   Authorization - Visit Number 2   Authorization - Number of Visits 10   OT Start Time 1350   OT Stop Time 1435   OT Time Calculation (min) 45 min   Activity Tolerance Patient tolerated treatment well   Behavior During Therapy [facility] for tasks assessed/performed      Past Medical History:  Diagnosis Date  . Anxiety    takes xanax  . Arthritis    fingers and neck  . Cataract immature   . Elevated hemoglobin A1c March 2016   level 6.2  . GERD (gastroesophageal reflux disease)   . Headache(784.0)    migraines  . History of hiatal hernia   . Hypertension   . IBS (irritable bowel syndrome)   . Kidney stones   . Melanoma ([place])    left eye  . Osteopenia 2016  . PONV (postoperative nausea and vomiting)     Past Surgical History:  Procedure Laterality Date  . ABDOMINAL HYSTERECTOMY  1978   partial  . BILATERAL SALPINGOOPHORECTOMY  2010   with robot  . CHOLECYSTECTOMY  2005   lap  . CYSTOSCOPY N/A 03/13/2015   Procedure: CYSTOSCOPY;  Surgeon: Nunzio Cobbs, MD;  Location: [facility];  Service: Gynecology;  Laterality: N/A;  . LITHOTRIPSY     20 years ago  . ROBOTIC ASSISTED SALPINGO OOPHERECTOMY Left 03/13/2015   Procedure: ROBOTIC ASSISTED INCISION OF  LEFT PELVIC MASS, LYSIS OF ADHESIONS  WITH PELVIC WASHINGS;  Surgeon: Nunzio Cobbs, MD;  Location: [facility];  Service: Gynecology;  Laterality: Left;  .  TONSILLECTOMY     as child  . TUBAL LIGATION  1976    There were no vitals filed for this visit.      Subjective Assessment - 03/26/17 1500    Subjective  S:  My arm was a bit sore after our session.  I didnt get a chance to try my exercises.   Currently in Pain? Yes   Pain Score 4    Pain Location Shoulder   Pain Orientation Left   Pain Descriptors / Indicators Aching   Pain Type Acute pain            [facility] OT Assessment - 03/26/17 0001      Assessment   Diagnosis S/P Left SAD, DCE     Precautions   Precautions None     Restrictions   Weight Bearing Restrictions No                  OT Treatments/Exercises (OP) - 03/26/17 0001      Exercises   Exercises Shoulder     Shoulder Exercises: Supine   Protraction PROM;5 reps;AAROM;10 reps   Horizontal ABduction PROM;5 reps;AAROM;10 reps   External Rotation PROM;5 reps;AAROM;10 reps   Internal Rotation PROM;5 reps;AAROM;10 reps   Flexion PROM;5 reps;AAROM;10 reps   ABduction PROM;5 reps;AAROM;10 reps  Shoulder Exercises: Seated   Elevation AROM;10 reps   Extension AROM;10 reps   Row AROM;10 reps   Protraction AAROM;10 reps   Horizontal ABduction AAROM;10 reps   External Rotation AAROM;10 reps   Internal Rotation AAROM;10 reps   Flexion AAROM;10 reps   Abduction AAROM;10 reps     Shoulder Exercises: Therapy Ball   Flexion 10 reps   ABduction 10 reps   Right/Left 5 reps;Limitations   Right/Left Limitations therapist assist for reaching into internal rotation     Shoulder Exercises: ROM/Strengthening   Wall Wash 1'     Manual Therapy   Manual Therapy Myofascial release   Manual therapy comments completed seperately from all other therapeutic interventions this date   Myofascial Release myofascial release and manual stretching to left upper arm, scapular, and shoulder region to decrease pain and restrictions and improve pain free mobility in her left shoulder region. scar massage and release to  surgical incisions                 OT Education - 03/25/17 1248    Education provided Yes   Education Details dowel rod AA/ROM in standing, elevation, retraction   Person(s) Educated Patient   Methods Explanation;Demonstration;Handout   Comprehension Verbalized understanding;Returned demonstration          OT Short Term Goals - 03/26/17 1503      OT SHORT TERM GOAL #1   Title Patient will be educated on an HEP for improving left shoulder movement and strength needed for ADL completion.    Time 4   Period Weeks   Status On-going     OT SHORT TERM GOAL #2   Title Patient will improve left shoulder A/ROM to [facility] for improved ability to lift her left arm for hygiene purposes without pain.    Time 4   Period Weeks   Status On-going     OT SHORT TERM GOAL #3   Title Patient will improve left shoulder  strength to 4/5 for greater ability to reach out to her side or overhead during ADL completion.   Time 4   Period Weeks   Status On-going     OT SHORT TERM GOAL #4   Title Patient will decrease left shoulder pain to 1/10 during reaching to side activities.    Time 4   Period Weeks   Status On-going     OT SHORT TERM GOAL #5   Title Patient will decrease left shoulder restrictions to minimal for greater mobility needed for ADL completion.    Time 4   Period Weeks   Status On-going           OT Long Term Goals - 03/26/17 1503      OT LONG TERM GOAL #1   Title Patient will use left arm with all B/IADLs and leisure activities with minimal pain and difficulty.   Time 8   Period Weeks   Status On-going     OT LONG TERM GOAL #2   Title Patient will have WNL A/ROM in her left shoulder in order to reach overhead and behind her head during ADL completion.    Time 8   Period Weeks   Status On-going     OT LONG TERM GOAL #3   Title Patient will have 5/5 strength in her left shoulder in order to lift bags of groceries and laundry baskets.    Time 8   Period Weeks    Status On-going  OT LONG TERM GOAL #4   Title Patient will decrease pain in her left shoulder to 1/10 or better during ADL completion.    Time 8   Period Weeks   Status On-going     OT LONG TERM GOAL #5   Title Patient will have trace fascial restrictions in order to have great er mobility needed for ADL completion.    Time 8   Period Weeks   Status On-going               Plan - 03/26/17 1501    Clinical Impression Statement A:  Patient with increased stiffness during P/ROM this date.  Patient able to compelte AA/ROM in supine and seated, with increased range and comfort in seated compared to supine.    Plan P:  add serratus anterior punch is supine and proximal shoulder strengthening in supine for improved shoulder stability needed for improved range and functional use.       Patient will benefit from skilled therapeutic intervention in order to improve the following deficits and impairments:  Impaired UE functional use, Increased fascial restricitons, Decreased range of motion, Pain, Decreased strength  Visit Diagnosis: Acute pain of left shoulder  Stiffness of left shoulder, not elsewhere classified  Other symptoms and signs involving the musculoskeletal system      G-Codes - 04/02/17 1255    Functional Assessment Tool Used (Outpatient only) FOTO:  38/100, 62% limited   Functional Limitation Carrying, moving and handling objects   Carrying, Moving and Handling Objects Current Status (E2336) At least 60 percent but less than 80 percent impaired, limited or restricted   Carrying, Moving and Handling Objects Goal Status (P2244) At least 20 percent but less than 40 percent impaired, limited or restricted      Problem List Patient Active Problem List   Diagnosis Date Noted  . OTHER DISEASES OF VOCAL CORDS 01/01/2009  . GERD 01/01/2009  . COUGH 01/01/2009    Vangie Bicker, [place], OTR/L [phone]  03/26/2017, 3:07 PM  [place] [address] [place], [place], [address] Phone: [phone]   Fax:  [phone]  Name: ALLESANDRA HUEBSCH MRN: [id number] Date of Birth: 07/11/1946

## 2017-03-31 ENCOUNTER — Ambulatory Visit (HOSPITAL_COMMUNITY): Payer: Medicare Other | Attending: Physician Assistant | Admitting: Specialist

## 2017-03-31 DIAGNOSIS — M25612 Stiffness of left shoulder, not elsewhere classified: Secondary | ICD-10-CM | POA: Diagnosis present

## 2017-03-31 DIAGNOSIS — R29898 Other symptoms and signs involving the musculoskeletal system: Secondary | ICD-10-CM | POA: Insufficient documentation

## 2017-03-31 DIAGNOSIS — M25512 Pain in left shoulder: Secondary | ICD-10-CM

## 2017-03-31 NOTE — Therapy (Signed)
[place], [place], [address] Phone: [phone]   Fax:  [phone]  Occupational Therapy Treatment  Patient Details  Name: Victoria Andrade MRN: [id number] Date of Birth: January 28, 1946 Referring Provider: Dr. Justice Britain  Encounter Date: 03/31/2017      OT End of Session - 03/31/17 2128    Visit Number 3   Number of Visits 16   Date for OT Re-Evaluation 05/24/17  mini reassess on 4/26   Authorization Type [employer]   Authorization Time Period before 10th visit   Authorization - Visit Number 3   Authorization - Number of Visits 10   OT Start Time 1520   OT Stop Time 1600   OT Time Calculation (min) 40 min   Activity Tolerance Patient tolerated treatment well   Behavior During Therapy [facility] for tasks assessed/performed      Past Medical History:  Diagnosis Date  . Anxiety    takes xanax  . Arthritis    fingers and neck  . Cataract immature   . Elevated hemoglobin A1c March 2016   level 6.2  . GERD (gastroesophageal reflux disease)   . Headache(784.0)    migraines  . History of hiatal hernia   . Hypertension   . IBS (irritable bowel syndrome)   . Kidney stones   . Melanoma ([place])    left eye  . Osteopenia 2016  . PONV (postoperative nausea and vomiting)     Past Surgical History:  Procedure Laterality Date  . ABDOMINAL HYSTERECTOMY  1978   partial  . BILATERAL SALPINGOOPHORECTOMY  2010   with robot  . CHOLECYSTECTOMY  2005   lap  . CYSTOSCOPY N/A 03/13/2015   Procedure: CYSTOSCOPY;  Surgeon: Nunzio Cobbs, MD;  Location: [facility];  Service: Gynecology;  Laterality: N/A;  . LITHOTRIPSY     20 years ago  . ROBOTIC ASSISTED SALPINGO OOPHERECTOMY Left 03/13/2015   Procedure: ROBOTIC ASSISTED INCISION OF  LEFT PELVIC MASS, LYSIS OF ADHESIONS  WITH PELVIC WASHINGS;  Surgeon: Nunzio Cobbs, MD;  Location: [facility];  Service: Gynecology;  Laterality: Left;  .  TONSILLECTOMY     as child  . TUBAL LIGATION  1976    There were no vitals filed for this visit.      Subjective Assessment - 03/31/17 2127    Subjective  S:  I have just a little tweak in my shoulder, I moved alot of candles, that may have aggrevated it.   Currently in Pain? Yes   Pain Score 3    Pain Location Shoulder   Pain Orientation Left;Anterior;Proximal            [facility] OT Assessment - 03/31/17 0001      Assessment   Diagnosis S/P Left SAD, DCE     Precautions   Precautions None                  OT Treatments/Exercises (OP) - 03/31/17 0001      Exercises   Exercises Shoulder     Shoulder Exercises: Supine   Protraction PROM;5 reps;AROM;10 reps   Horizontal ABduction PROM;5 reps;AROM;10 reps   External Rotation PROM;5 reps;AROM;10 reps   Internal Rotation PROM;5 reps;AROM;10 reps   Flexion PROM;5 reps;AROM;10 reps   ABduction PROM;5 reps;AROM;10 reps     Shoulder Exercises: Seated   Elevation AROM;10 reps   Extension AROM;10 reps   Row AROM;10 reps   Protraction AROM;10  reps   Horizontal ABduction AROM;10 reps   External Rotation AROM;10 reps   Internal Rotation AROM;10 reps   Flexion AROM;10 reps   Abduction AROM;10 reps     Shoulder Exercises: Therapy Ball   Right/Left 5 reps     Shoulder Exercises: ROM/Strengthening   Wall Wash 1'   "W" Arms 10 times   X to V Arms 10 times   Proximal Shoulder Strengthening, Supine 10 times   Proximal Shoulder Strengthening, Seated 10 times   Ball on Wall 30 seconds with arm flexed to 90 and 30" with arm abducted to 90     Manual Therapy   Manual Therapy Myofascial release   Manual therapy comments completed seperately from all other therapeutic interventions this date   Myofascial Release myofascial release and manual stretching to left upper arm, scapular, and shoulder region to decrease pain and restrictions and improve pain free mobility in her left shoulder region. scar massage and release  to surgical incisions                   OT Short Term Goals - 03/26/17 1503      OT SHORT TERM GOAL #1   Title Patient will be educated on an HEP for improving left shoulder movement and strength needed for ADL completion.    Time 4   Period Weeks   Status On-going     OT SHORT TERM GOAL #2   Title Patient will improve left shoulder A/ROM to [facility] for improved ability to lift her left arm for hygiene purposes without pain.    Time 4   Period Weeks   Status On-going     OT SHORT TERM GOAL #3   Title Patient will improve left shoulder  strength to 4/5 for greater ability to reach out to her side or overhead during ADL completion.   Time 4   Period Weeks   Status On-going     OT SHORT TERM GOAL #4   Title Patient will decrease left shoulder pain to 1/10 during reaching to side activities.    Time 4   Period Weeks   Status On-going     OT SHORT TERM GOAL #5   Title Patient will decrease left shoulder restrictions to minimal for greater mobility needed for ADL completion.    Time 4   Period Weeks   Status On-going           OT Long Term Goals - 03/26/17 1503      OT LONG TERM GOAL #1   Title Patient will use left arm with all B/IADLs and leisure activities with minimal pain and difficulty.   Time 8   Period Weeks   Status On-going     OT LONG TERM GOAL #2   Title Patient will have WNL A/ROM in her left shoulder in order to reach overhead and behind her head during ADL completion.    Time 8   Period Weeks   Status On-going     OT LONG TERM GOAL #3   Title Patient will have 5/5 strength in her left shoulder in order to lift bags of groceries and laundry baskets.    Time 8   Period Weeks   Status On-going     OT LONG TERM GOAL #4   Title Patient will decrease pain in her left shoulder to 1/10 or better during ADL completion.    Time 8   Period Weeks   Status On-going  OT LONG TERM GOAL #5   Title Patient will have trace fascial restrictions in  order to have great er mobility needed for ADL completion.    Time 8   Period Weeks   Status On-going               Plan - 03/31/17 2129    Clinical Impression Statement A:  progressed to A/ROM in supine and seated this date.  patient has pain in anterior shoulder along clavicle, 2 inches proximal to shoulder joint.  pain intensified with therapeutic exercises and was alleviated by 25% with manual therapy   Plan :  add serratus anterior punch in supine.  increase ease with ball circles in both directions as her a/rom increases.      Patient will benefit from skilled therapeutic intervention in order to improve the following deficits and impairments:  Impaired UE functional use, Increased fascial restricitons, Decreased range of motion, Pain, Decreased strength  Visit Diagnosis: Acute pain of left shoulder  Stiffness of left shoulder, not elsewhere classified  Other symptoms and signs involving the musculoskeletal system    Problem List Patient Active Problem List   Diagnosis Date Noted  . OTHER DISEASES OF VOCAL CORDS 01/01/2009  . GERD 01/01/2009  . COUGH 01/01/2009   Vangie Bicker, [place], OTR/L [phone]  03/31/2017, 9:36 PM  [place] [address] [place], [place], [address] Phone: [phone]   Fax:  [phone]  Name: Victoria Andrade MRN: [id number] Date of Birth: 06-17-1946

## 2017-04-03 ENCOUNTER — Ambulatory Visit (HOSPITAL_COMMUNITY): Payer: Medicare Other | Admitting: Specialist

## 2017-04-03 DIAGNOSIS — M25612 Stiffness of left shoulder, not elsewhere classified: Secondary | ICD-10-CM

## 2017-04-03 DIAGNOSIS — M25512 Pain in left shoulder: Secondary | ICD-10-CM | POA: Diagnosis not present

## 2017-04-03 DIAGNOSIS — R29898 Other symptoms and signs involving the musculoskeletal system: Secondary | ICD-10-CM

## 2017-04-03 NOTE — Therapy (Signed)
[place], [place], [address] Phone: [phone]   Fax:  [phone]  Occupational Therapy Treatment  Patient Details  Name: Victoria Andrade MRN: [id number] Date of Birth: [birth year] Referring Provider: Dr. Justice Britain  Encounter Date: 04/03/2017      OT End of Session - 04/03/17 1500    Visit Number 4   Number of Visits 16   Date for OT Re-Evaluation 05/24/17  mini reasess on 4/26   Authorization Type [employer]   Authorization Time Period before 10th visit   Authorization - Visit Number 4   Authorization - Number of Visits 10   OT Start Time 1433   OT Stop Time 1515   OT Time Calculation (min) 42 min      Past Medical History:  Diagnosis Date  . Anxiety    takes xanax  . Arthritis    fingers and neck  . Cataract immature   . Elevated hemoglobin A1c March 2016   level 6.2  . GERD (gastroesophageal reflux disease)   . Headache(784.0)    migraines  . History of hiatal hernia   . Hypertension   . IBS (irritable bowel syndrome)   . Kidney stones   . Melanoma ([place])    left eye  . Osteopenia 2016  . PONV (postoperative nausea and vomiting)     Past Surgical History:  Procedure Laterality Date  . ABDOMINAL HYSTERECTOMY  1978   partial  . BILATERAL SALPINGOOPHORECTOMY  2010   with robot  . CHOLECYSTECTOMY  2005   lap  . CYSTOSCOPY N/A 03/13/2015   Procedure: CYSTOSCOPY;  Surgeon: Nunzio Cobbs, MD;  Location: [facility];  Service: Gynecology;  Laterality: N/A;  . LITHOTRIPSY     20 years ago  . ROBOTIC ASSISTED SALPINGO OOPHERECTOMY Left 03/13/2015   Procedure: ROBOTIC ASSISTED INCISION OF  LEFT PELVIC MASS, LYSIS OF ADHESIONS  WITH PELVIC WASHINGS;  Surgeon: Nunzio Cobbs, MD;  Location: [facility];  Service: Gynecology;  Laterality: Left;  . TONSILLECTOMY     as child  . TUBAL LIGATION  1976    There were no vitals filed for this visit.      Subjective  Assessment - 04/03/17 1434    Subjective  S:  Im still having that twingy feeling on my collar bone when I move it.    Currently in Pain? Yes   Pain Score 3    Pain Location Shoulder   Pain Orientation Anterior;Left   Pain Descriptors / Indicators Aching   Pain Type Acute pain   Pain Onset In the past 7 days            [facility] OT Assessment - 04/03/17 0001      Assessment   Diagnosis S/P Left SAD, DCE                  OT Treatments/Exercises (OP) - 04/03/17 0001      Exercises   Exercises Shoulder     Shoulder Exercises: Supine   Protraction PROM;5 reps;AROM;10 reps   Horizontal ABduction 5 reps;PROM;AROM;10 reps   External Rotation PROM;5 reps   Internal Rotation PROM;5 reps   Flexion PROM;5 reps   ABduction PROM;5 reps   Other Supine Exercises serratus anterior punch 15 times with min vg     Shoulder Exercises: Seated   External Rotation AROM;12 reps   Internal Rotation AROM;12 reps   Flexion AROM;12 reps  Abduction AROM;12 reps     Shoulder Exercises: ROM/Strengthening   Wall Wash 1'   "W" Arms 10 times   X to V Arms 10 times   Proximal Shoulder Strengthening, Seated 10 times   Ball on Wall 30 seconds with arm flexed to 90 and 30" with arm abducted to 90     Manual Therapy   Manual Therapy Myofascial release   Manual therapy comments completed seperately from all other therapeutic interventions this date   Myofascial Release myofascial release and manual stretching to left upper arm, scapular, and shoulder region to decrease pain and restrictions and improve pain free mobility in her left shoulder region. scar massage and release to surgical incisions                   OT Short Term Goals - 03/26/17 1503      OT SHORT TERM GOAL #1   Title Patient will be educated on an HEP for improving left shoulder movement and strength needed for ADL completion.    Time 4   Period Weeks   Status On-going     OT SHORT TERM GOAL #2   Title  Patient will improve left shoulder A/ROM to [facility] for improved ability to lift her left arm for hygiene purposes without pain.    Time 4   Period Weeks   Status On-going     OT SHORT TERM GOAL #3   Title Patient will improve left shoulder  strength to 4/5 for greater ability to reach out to her side or overhead during ADL completion.   Time 4   Period Weeks   Status On-going     OT SHORT TERM GOAL #4   Title Patient will decrease left shoulder pain to 1/10 during reaching to side activities.    Time 4   Period Weeks   Status On-going     OT SHORT TERM GOAL #5   Title Patient will decrease left shoulder restrictions to minimal for greater mobility needed for ADL completion.    Time 4   Period Weeks   Status On-going           OT Long Term Goals - 03/26/17 1503      OT LONG TERM GOAL #1   Title Patient will use left arm with all B/IADLs and leisure activities with minimal pain and difficulty.   Time 8   Period Weeks   Status On-going     OT LONG TERM GOAL #2   Title Patient will have WNL A/ROM in her left shoulder in order to reach overhead and behind her head during ADL completion.    Time 8   Period Weeks   Status On-going     OT LONG TERM GOAL #3   Title Patient will have 5/5 strength in her left shoulder in order to lift bags of groceries and laundry baskets.    Time 8   Period Weeks   Status On-going     OT LONG TERM GOAL #4   Title Patient will decrease pain in her left shoulder to 1/10 or better during ADL completion.    Time 8   Period Weeks   Status On-going     OT LONG TERM GOAL #5   Title Patient will have trace fascial restrictions in order to have great er mobility needed for ADL completion.    Time 8   Period Weeks   Status On-going  Plan - 04/03/17 2202    Clinical Impression Statement A:  discontinued a/rom in supine as it is painful for patient, completed in seated with good form and less pain.  continues to experience  pain in mid clavicle region which refers to supraspinatus   Plan P:  add overhead lace and increase ball on wall to 1' as proximal shoulder strength improves.       Patient will benefit from skilled therapeutic intervention in order to improve the following deficits and impairments:  Impaired UE functional use, Increased fascial restricitons, Decreased range of motion, Pain, Decreased strength  Visit Diagnosis: Acute pain of left shoulder  Stiffness of left shoulder, not elsewhere classified  Other symptoms and signs involving the musculoskeletal system    Problem List Patient Active Problem List   Diagnosis Date Noted  . OTHER DISEASES OF VOCAL CORDS 01/01/2009  . GERD 01/01/2009  . COUGH 01/01/2009    Vangie Bicker, [place], OTR/L [phone]  04/03/2017, 10:06 PM  [facility] [address] [place], [place], [address] Phone: [phone]   Fax:  [phone]  Name: Victoria Andrade MRN: [id number] Date of Birth: 1946/06/16

## 2017-04-06 ENCOUNTER — Ambulatory Visit (HOSPITAL_COMMUNITY): Payer: Medicare Other

## 2017-04-06 ENCOUNTER — Encounter (HOSPITAL_COMMUNITY): Payer: Self-pay

## 2017-04-06 DIAGNOSIS — R29898 Other symptoms and signs involving the musculoskeletal system: Secondary | ICD-10-CM

## 2017-04-06 DIAGNOSIS — M25512 Pain in left shoulder: Secondary | ICD-10-CM

## 2017-04-06 DIAGNOSIS — M25612 Stiffness of left shoulder, not elsewhere classified: Secondary | ICD-10-CM

## 2017-04-06 NOTE — Therapy (Signed)
[facility] [place], [place], [address] Phone: [phone]   Fax:  [phone]  Occupational Therapy Treatment  Patient Details  Name: Victoria Andrade MRN: [id number] Date of Birth: 1946/09/27 Referring Provider: Dr. Justice Britain  Encounter Date: 04/06/2017      OT End of Session - 04/06/17 1157    Visit Number 5   Number of Visits 16   Date for OT Re-Evaluation 05/24/17  mini reasess on 4/26   Authorization Type [employer]   Authorization Time Period before 10th visit   Authorization - Visit Number 5   Authorization - Number of Visits 10   OT Start Time 1120   OT Stop Time 1200   OT Time Calculation (min) 40 min   Activity Tolerance Patient tolerated treatment well   Behavior During Therapy [facility] for tasks assessed/performed      Past Medical History:  Diagnosis Date  . Anxiety    takes xanax  . Arthritis    fingers and neck  . Cataract immature   . Elevated hemoglobin A1c March 2016   level 6.2  . GERD (gastroesophageal reflux disease)   . Headache(784.0)    migraines  . History of hiatal hernia   . Hypertension   . IBS (irritable bowel syndrome)   . Kidney stones   . Melanoma ([place])    left eye  . Osteopenia 2016  . PONV (postoperative nausea and vomiting)     Past Surgical History:  Procedure Laterality Date  . ABDOMINAL HYSTERECTOMY  1978   partial  . BILATERAL SALPINGOOPHORECTOMY  2010   with robot  . CHOLECYSTECTOMY  2005   lap  . CYSTOSCOPY N/A 03/13/2015   Procedure: CYSTOSCOPY;  Surgeon: Nunzio Cobbs, MD;  Location: [facility];  Service: Gynecology;  Laterality: N/A;  . LITHOTRIPSY     20 years ago  . ROBOTIC ASSISTED SALPINGO OOPHERECTOMY Left 03/13/2015   Procedure: ROBOTIC ASSISTED INCISION OF  LEFT PELVIC MASS, LYSIS OF ADHESIONS  WITH PELVIC WASHINGS;  Surgeon: Nunzio Cobbs, MD;  Location: [facility];  Service: Gynecology;  Laterality: Left;  .  TONSILLECTOMY     as child  . TUBAL LIGATION  1976    There were no vitals filed for this visit.      Subjective Assessment - 04/06/17 1136    Subjective  S: I'm feeling a little sore in the front of my shoulder this time.    Currently in Pain? Yes   Pain Score 3    Pain Location Shoulder   Pain Orientation Anterior;Left   Pain Descriptors / Indicators Sore   Pain Type Acute pain   Pain Radiating Towards N/A   Pain Onset In the past 7 days   Pain Frequency Intermittent   Aggravating Factors  end range movement   Pain Relieving Factors rest   Effect of Pain on Daily Activities min effect   Multiple Pain Sites No            [facility] OT Assessment - 04/06/17 1138      Assessment   Diagnosis S/P Left SAD, DCE     Precautions   Precautions None                  OT Treatments/Exercises (OP) - 04/06/17 1137      Exercises   Exercises Shoulder     Shoulder Exercises: Supine   Protraction PROM;5 reps   Horizontal  ABduction PROM;5 reps   External Rotation PROM;5 reps   Internal Rotation PROM;5 reps   Flexion PROM;5 reps   ABduction PROM;5 reps     Shoulder Exercises: Standing   Horizontal ABduction AROM;15 reps   External Rotation AROM;15 reps   Internal Rotation AROM;15 reps   Flexion AROM;15 reps   ABduction AROM;15 reps   Extension Theraband;12 reps   Theraband Level (Shoulder Extension) Level 2 (Red)   Row Theraband;12 reps   Theraband Level (Shoulder Row) Level 2 (Red)   Retraction Theraband;12 reps   Theraband Level (Shoulder Retraction) Level 2 (Red)     Shoulder Exercises: ROM/Strengthening   UBE (Upper Arm Bike) Level 1 2' reverse 2' forward   Over Head Lace 2'    "W" Arms 12X   X to V Arms 12X   Proximal Shoulder Strengthening, Seated 12X no rest breaks   Ball on Wall 1' flexion 40 seconds abduction with blue weighted ball     Manual Therapy   Manual Therapy Myofascial release   Manual therapy comments completed seperately from all  other therapeutic interventions this date   Myofascial Release myofascial release and manual stretching to left upper arm, scapular, and shoulder region to decrease pain and restrictions and improve pain free mobility in her left shoulder region. scar massage and release to surgical incisions                   OT Short Term Goals - 03/26/17 1503      OT SHORT TERM GOAL #1   Title Patient will be educated on an HEP for improving left shoulder movement and strength needed for ADL completion.    Time 4   Period Weeks   Status On-going     OT SHORT TERM GOAL #2   Title Patient will improve left shoulder A/ROM to [facility] for improved ability to lift her left arm for hygiene purposes without pain.    Time 4   Period Weeks   Status On-going     OT SHORT TERM GOAL #3   Title Patient will improve left shoulder  strength to 4/5 for greater ability to reach out to her side or overhead during ADL completion.   Time 4   Period Weeks   Status On-going     OT SHORT TERM GOAL #4   Title Patient will decrease left shoulder pain to 1/10 during reaching to side activities.    Time 4   Period Weeks   Status On-going     OT SHORT TERM GOAL #5   Title Patient will decrease left shoulder restrictions to minimal for greater mobility needed for ADL completion.    Time 4   Period Weeks   Status On-going           OT Long Term Goals - 03/26/17 1503      OT LONG TERM GOAL #1   Title Patient will use left arm with all B/IADLs and leisure activities with minimal pain and difficulty.   Time 8   Period Weeks   Status On-going     OT LONG TERM GOAL #2   Title Patient will have WNL A/ROM in her left shoulder in order to reach overhead and behind her head during ADL completion.    Time 8   Period Weeks   Status On-going     OT LONG TERM GOAL #3   Title Patient will have 5/5 strength in her left shoulder in order to lift bags  of groceries and [employer].    Time 8   Period Weeks    Status On-going     OT LONG TERM GOAL #4   Title Patient will decrease pain in her left shoulder to 1/10 or better during ADL completion.    Time 8   Period Weeks   Status On-going     OT LONG TERM GOAL #5   Title Patient will have trace fascial restrictions in order to have great er mobility needed for ADL completion.    Time 8   Period Weeks   Status On-going               Plan - 04/06/17 1157    Clinical Impression Statement A: Added overhead lacing. Patient was able to complete ball on the wall flexion for 1' although was still limited with abduction. VC for form and technique.   Plan P: Add 1# handweight supine.       Patient will benefit from skilled therapeutic intervention in order to improve the following deficits and impairments:  Impaired UE functional use, Increased fascial restricitons, Decreased range of motion, Pain, Decreased strength  Visit Diagnosis: Acute pain of left shoulder  Stiffness of left shoulder, not elsewhere classified  Other symptoms and signs involving the musculoskeletal system    Problem List Patient Active Problem List   Diagnosis Date Noted  . OTHER DISEASES OF VOCAL CORDS 01/01/2009  . GERD 01/01/2009  . COUGH 01/01/2009   Ailene Ravel, OTR/L,CBIS  [phone]  04/06/2017, 12:30 PM  [facility] [address] [place], [place], [address] Phone: [phone]   Fax:  [phone]  Name: Victoria Andrade MRN: [id number] Date of Birth: 1946-07-17

## 2017-04-08 ENCOUNTER — Ambulatory Visit (HOSPITAL_COMMUNITY): Payer: Medicare Other | Admitting: Occupational Therapy

## 2017-04-08 ENCOUNTER — Encounter (HOSPITAL_COMMUNITY): Payer: Self-pay | Admitting: Occupational Therapy

## 2017-04-08 DIAGNOSIS — R29898 Other symptoms and signs involving the musculoskeletal system: Secondary | ICD-10-CM

## 2017-04-08 DIAGNOSIS — M25512 Pain in left shoulder: Secondary | ICD-10-CM

## 2017-04-08 DIAGNOSIS — M25612 Stiffness of left shoulder, not elsewhere classified: Secondary | ICD-10-CM

## 2017-04-08 NOTE — Therapy (Signed)
[facility] [place], [place], [address] Phone: [phone]   Fax:  [phone]  Occupational Therapy Treatment  Patient Details  Name: Victoria Andrade MRN: [id number] Date of Birth: October 30, 1946 Referring Provider: Dr. Justice Britain  Encounter Date: 04/08/2017      OT End of Session - 04/08/17 1222    Visit Number 6   Number of Visits 16   Date for OT Re-Evaluation 05/24/17  mini reasess on 4/26   Authorization Type [employer]   Authorization Time Period before 10th visit   Authorization - Visit Number 6   Authorization - Number of Visits 10   OT Start Time 1032   OT Stop Time 1114   OT Time Calculation (min) 42 min   Activity Tolerance Patient tolerated treatment well   Behavior During Therapy [facility] for tasks assessed/performed      Past Medical History:  Diagnosis Date  . Anxiety    takes xanax  . Arthritis    fingers and neck  . Cataract immature   . Elevated hemoglobin A1c March 2016   level 6.2  . GERD (gastroesophageal reflux disease)   . Headache(784.0)    migraines  . History of hiatal hernia   . Hypertension   . IBS (irritable bowel syndrome)   . Kidney stones   . Melanoma ([place])    left eye  . Osteopenia 2016  . PONV (postoperative nausea and vomiting)     Past Surgical History:  Procedure Laterality Date  . ABDOMINAL HYSTERECTOMY  1978   partial  . BILATERAL SALPINGOOPHORECTOMY  2010   with robot  . CHOLECYSTECTOMY  2005   lap  . CYSTOSCOPY N/A 03/13/2015   Procedure: CYSTOSCOPY;  Surgeon: Nunzio Cobbs, MD;  Location: [facility];  Service: Gynecology;  Laterality: N/A;  . LITHOTRIPSY     20 years ago  . ROBOTIC ASSISTED SALPINGO OOPHERECTOMY Left 03/13/2015   Procedure: ROBOTIC ASSISTED INCISION OF  LEFT PELVIC MASS, LYSIS OF ADHESIONS  WITH PELVIC WASHINGS;  Surgeon: Nunzio Cobbs, MD;  Location: [facility];  Service: Gynecology;  Laterality: Left;  .  TONSILLECTOMY     as child  . TUBAL LIGATION  1976    There were no vitals filed for this visit.      Subjective Assessment - 04/08/17 1034    Subjective  S: I'm afraid I'll be really stiff at my next session because there is a week in between.    Currently in Pain? Yes   Pain Score 1    Pain Location Shoulder   Pain Orientation Left   Pain Descriptors / Indicators Sore   Pain Type Acute pain   Pain Radiating Towards n/a   Pain Onset In the past 7 days   Pain Frequency Intermittent   Aggravating Factors  end range movement   Pain Relieving Factors rest    Effect of Pain on Daily Activities min effect   Multiple Pain Sites No            [facility] OT Assessment - 04/08/17 1029      Assessment   Diagnosis S/P Left SAD, DCE     Precautions   Precautions None                  OT Treatments/Exercises (OP) - 04/08/17 1034      Exercises   Exercises Shoulder     Shoulder Exercises: Supine   Protraction  PROM;5 reps;Strengthening;10 reps   Protraction Weight (lbs) 1   Horizontal ABduction PROM;5 reps;Strengthening;10 reps   Horizontal ABduction Weight (lbs) 1   External Rotation PROM;5 reps;Strengthening;10 reps   External Rotation Weight (lbs) 1   Internal Rotation PROM;5 reps;Strengthening;10 reps   Internal Rotation Weight (lbs) 1   Flexion PROM;5 reps;Strengthening;10 reps   Shoulder Flexion Weight (lbs) 1   ABduction PROM;5 reps;Strengthening;10 reps   Shoulder ABduction Weight (lbs) 1     Shoulder Exercises: Prone   Flexion AROM;10 reps   Horizontal ABduction 1 AROM;10 reps   Other Prone Exercises protraction 10X     Shoulder Exercises: Sidelying   Flexion AROM;5 reps   ABduction AROM;10 reps   Other Sidelying Exercises protraction 5X     Shoulder Exercises: Standing   Extension Theraband;12 reps   Theraband Level (Shoulder Extension) Level 2 (Red)   Row Theraband;12 reps   Theraband Level (Shoulder Row) Level 2 (Red)   Retraction  Theraband;12 reps   Theraband Level (Shoulder Retraction) Level 2 (Red)     Shoulder Exercises: ROM/Strengthening   Ball on Wall 1' flexion 40 seconds abduction with blue weighted ball     Manual Therapy   Manual Therapy Myofascial release   Manual therapy comments completed seperately from all other therapeutic interventions this date   Myofascial Release myofascial release and manual stretching to left upper arm, scapular, and shoulder region to decrease pain and restrictions and improve pain free mobility in her left shoulder region. scar massage and release to surgical incisions                   OT Short Term Goals - 03/26/17 1503      OT SHORT TERM GOAL #1   Title Patient will be educated on an HEP for improving left shoulder movement and strength needed for ADL completion.    Time 4   Period Weeks   Status On-going     OT SHORT TERM GOAL #2   Title Patient will improve left shoulder A/ROM to [facility] for improved ability to lift her left arm for hygiene purposes without pain.    Time 4   Period Weeks   Status On-going     OT SHORT TERM GOAL #3   Title Patient will improve left shoulder  strength to 4/5 for greater ability to reach out to her side or overhead during ADL completion.   Time 4   Period Weeks   Status On-going     OT SHORT TERM GOAL #4   Title Patient will decrease left shoulder pain to 1/10 during reaching to side activities.    Time 4   Period Weeks   Status On-going     OT SHORT TERM GOAL #5   Title Patient will decrease left shoulder restrictions to minimal for greater mobility needed for ADL completion.    Time 4   Period Weeks   Status On-going           OT Long Term Goals - 03/26/17 1503      OT LONG TERM GOAL #1   Title Patient will use left arm with all B/IADLs and leisure activities with minimal pain and difficulty.   Time 8   Period Weeks   Status On-going     OT LONG TERM GOAL #2   Title Patient will have WNL A/ROM in her  left shoulder in order to reach overhead and behind her head during ADL completion.    Time 8  Period Weeks   Status On-going     OT LONG TERM GOAL #3   Title Patient will have 5/5 strength in her left shoulder in order to lift bags of groceries and laundry baskets.    Time 8   Period Weeks   Status On-going     OT LONG TERM GOAL #4   Title Patient will decrease pain in her left shoulder to 1/10 or better during ADL completion.    Time 8   Period Weeks   Status On-going     OT LONG TERM GOAL #5   Title Patient will have trace fascial restrictions in order to have great er mobility needed for ADL completion.    Time 8   Period Weeks   Status On-going               Plan - 04/08/17 1222    Clinical Impression Statement A: Added 1# weight in supine, pt reporting pain at anterior shoulder region. Added prone exercises with pt standing, bent over at mat table, pt able to complete with less discomfort. Continued with theraband and ball on wall.    Plan P: Continue with strengthening, attempt strengthening in sidelying or standing. Update HEP for red theraband   OT Home Exercise Plan 03/25/17:  AA/ROM in seated/supine, elevation, retraction   Consulted and Agree with Plan of Care Patient      Patient will benefit from skilled therapeutic intervention in order to improve the following deficits and impairments:  Impaired UE functional use, Increased fascial restricitons, Decreased range of motion, Pain, Decreased strength  Visit Diagnosis: Acute pain of left shoulder  Stiffness of left shoulder, not elsewhere classified  Other symptoms and signs involving the musculoskeletal system    Problem List Patient Active Problem List   Diagnosis Date Noted  . OTHER DISEASES OF VOCAL CORDS 01/01/2009  . GERD 01/01/2009  . COUGH 01/01/2009   Guadelupe Sabin, OTR/L  [phone] 04/08/2017, 12:27 PM  [place], [place], [address] Phone: [phone]   Fax:  [phone]  Name: SAAYA PROCELL MRN: [id number] Date of Birth: [birth year]

## 2017-04-15 ENCOUNTER — Encounter (HOSPITAL_COMMUNITY): Payer: Self-pay

## 2017-04-15 ENCOUNTER — Ambulatory Visit (HOSPITAL_COMMUNITY): Payer: Medicare Other

## 2017-04-15 DIAGNOSIS — M25612 Stiffness of left shoulder, not elsewhere classified: Secondary | ICD-10-CM

## 2017-04-15 DIAGNOSIS — M25512 Pain in left shoulder: Secondary | ICD-10-CM | POA: Diagnosis not present

## 2017-04-15 DIAGNOSIS — R29898 Other symptoms and signs involving the musculoskeletal system: Secondary | ICD-10-CM

## 2017-04-15 NOTE — Patient Instructions (Signed)

## 2017-04-15 NOTE — Therapy (Signed)
[facility], [place], [address] Phone: [phone]   Fax:  [phone]  Occupational Therapy Treatment  Patient Details  Name: Victoria Andrade MRN: [id number] Date of Birth: 12-Dec-1946 Referring Provider: Dr. Justice Britain  Encounter Date: 04/15/2017      OT End of Session - 04/15/17 1629    Visit Number 7   Number of Visits 16   Date for OT Re-Evaluation 05/24/17  mini reasess on 4/26   Authorization Type [employer]   Authorization Time Period before 10th visit   Authorization - Visit Number 7   Authorization - Number of Visits 10   OT Start Time 1120   OT Stop Time 1200   OT Time Calculation (min) 40 min   Activity Tolerance Patient tolerated treatment well   Behavior During Therapy [facility] for tasks assessed/performed      Past Medical History:  Diagnosis Date  . Anxiety    takes xanax  . Arthritis    fingers and neck  . Cataract immature   . Elevated hemoglobin A1c March 2016   level 6.2  . GERD (gastroesophageal reflux disease)   . Headache(784.0)    migraines  . History of hiatal hernia   . Hypertension   . IBS (irritable bowel syndrome)   . Kidney stones   . Melanoma ([place])    left eye  . Osteopenia 2016  . PONV (postoperative nausea and vomiting)     Past Surgical History:  Procedure Laterality Date  . ABDOMINAL HYSTERECTOMY  1978   partial  . BILATERAL SALPINGOOPHORECTOMY  2010   with robot  . CHOLECYSTECTOMY  2005   lap  . CYSTOSCOPY N/A 03/13/2015   Procedure: CYSTOSCOPY;  Surgeon: Nunzio Cobbs, MD;  Location: [facility];  Service: Gynecology;  Laterality: N/A;  . LITHOTRIPSY     20 years ago  . ROBOTIC ASSISTED SALPINGO OOPHERECTOMY Left 03/13/2015   Procedure: ROBOTIC ASSISTED INCISION OF  LEFT PELVIC MASS, LYSIS OF ADHESIONS  WITH PELVIC WASHINGS;  Surgeon: Nunzio Cobbs, MD;  Location: [facility];  Service: Gynecology;  Laterality: Left;  .  TONSILLECTOMY     as child  . TUBAL LIGATION  1976    There were no vitals filed for this visit.      Subjective Assessment - 04/15/17 1428    Subjective  S: I don't know what I did but it feels worse today than it felt before my surgery.   Currently in Pain? Yes   Pain Score 3    Pain Location Shoulder   Pain Orientation Left   Pain Descriptors / Indicators Sore   Pain Type Acute pain   Pain Radiating Towards N/A   Pain Onset In the past 7 days   Pain Frequency Intermittent   Aggravating Factors  end raneg movement   Pain Relieving Factors rest   Effect of Pain on Daily Activities min effect   Multiple Pain Sites No            [facility] OT Assessment - 04/15/17 1429      Assessment   Diagnosis S/P Left SAD, DCE     Precautions   Precautions None                  OT Treatments/Exercises (OP) - 04/15/17 1429      Exercises   Exercises Shoulder     Shoulder Exercises: Supine   Protraction PROM;5 reps;Strengthening;10  reps   Protraction Weight (lbs) 1   Horizontal ABduction PROM;5 reps;Strengthening;10 reps   Horizontal ABduction Weight (lbs) 1   External Rotation PROM;5 reps;Strengthening;10 reps   External Rotation Weight (lbs) 1   Internal Rotation PROM;5 reps;Strengthening;10 reps   Internal Rotation Weight (lbs) 1   Flexion PROM;5 reps;Strengthening;10 reps   Shoulder Flexion Weight (lbs) 1   ABduction PROM;5 reps;AROM;10 reps     Shoulder Exercises: Standing   Extension Theraband;12 reps   Theraband Level (Shoulder Extension) Level 2 (Red)   Row Theraband;12 reps   Theraband Level (Shoulder Row) Level 2 (Red)   Retraction Theraband;12 reps   Theraband Level (Shoulder Retraction) Level 2 (Red)     Manual Therapy   Manual Therapy Myofascial release   Manual therapy comments completed seperately from all other therapeutic interventions this date   Myofascial Release myofascial release and manual stretching to left upper arm, scapular, and  shoulder region to decrease pain and restrictions and improve pain free mobility in her left shoulder region. scar massage and release to surgical incisions                 OT Education - 04/15/17 1629    Education provided Yes   Education Details Shoulder scapular strengthening with red theraband   Person(s) Educated Patient   Methods Explanation;Demonstration;Verbal cues;Handout   Comprehension Returned demonstration;Verbalized understanding          OT Short Term Goals - 03/26/17 1503      OT SHORT TERM GOAL #1   Title Patient will be educated on an HEP for improving left shoulder movement and strength needed for ADL completion.    Time 4   Period Weeks   Status On-going     OT SHORT TERM GOAL #2   Title Patient will improve left shoulder A/ROM to [facility] for improved ability to lift her left arm for hygiene purposes without pain.    Time 4   Period Weeks   Status On-going     OT SHORT TERM GOAL #3   Title Patient will improve left shoulder  strength to 4/5 for greater ability to reach out to her side or overhead during ADL completion.   Time 4   Period Weeks   Status On-going     OT SHORT TERM GOAL #4   Title Patient will decrease left shoulder pain to 1/10 during reaching to side activities.    Time 4   Period Weeks   Status On-going     OT SHORT TERM GOAL #5   Title Patient will decrease left shoulder restrictions to minimal for greater mobility needed for ADL completion.    Time 4   Period Weeks   Status On-going           OT Long Term Goals - 03/26/17 1503      OT LONG TERM GOAL #1   Title Patient will use left arm with all B/IADLs and leisure activities with minimal pain and difficulty.   Time 8   Period Weeks   Status On-going     OT LONG TERM GOAL #2   Title Patient will have WNL A/ROM in her left shoulder in order to reach overhead and behind her head during ADL completion.    Time 8   Period Weeks   Status On-going     OT LONG TERM  GOAL #3   Title Patient will have 5/5 strength in her left shoulder in order to lift bags of groceries  and laundry baskets.    Time 8   Period Weeks   Status On-going     OT LONG TERM GOAL #4   Title Patient will decrease pain in her left shoulder to 1/10 or better during ADL completion.    Time 8   Period Weeks   Status On-going     OT LONG TERM GOAL #5   Title Patient will have trace fascial restrictions in order to have great er mobility needed for ADL completion.    Time 8   Period Weeks   Status On-going               Plan - 04/15/17 1630    Clinical Impression Statement A: Pt presents with reports of increased pain in shoulder. Does not recall doing anything besides helping her husband move some tables and chairs. Pain began on Sunday. During exercises pt experienced pain in anterior deltoid region. Max VC needed for form and technique during scapular strengthening exercises.    Plan P: Continue to work on strengthening. Monitor pain from previous session.       Patient will benefit from skilled therapeutic intervention in order to improve the following deficits and impairments:  Impaired UE functional use, Increased fascial restricitons, Decreased range of motion, Pain, Decreased strength  Visit Diagnosis: Acute pain of left shoulder  Stiffness of left shoulder, not elsewhere classified  Other symptoms and signs involving the musculoskeletal system    Problem List Patient Active Problem List   Diagnosis Date Noted  . OTHER DISEASES OF VOCAL CORDS 01/01/2009  . GERD 01/01/2009  . COUGH 01/01/2009   Ailene Ravel, OTR/L,CBIS  [phone]  04/15/2017, 4:32 PM  [facility] [address] [place], [place], [address] Phone: [phone]   Fax:  [phone]  Name: Victoria Andrade MRN: [id number] Date of Birth: [birth year]

## 2017-04-17 ENCOUNTER — Ambulatory Visit (HOSPITAL_COMMUNITY): Payer: Medicare Other

## 2017-04-17 ENCOUNTER — Other Ambulatory Visit: Payer: Self-pay | Admitting: Obstetrics and Gynecology

## 2017-04-17 ENCOUNTER — Encounter (HOSPITAL_COMMUNITY): Payer: Self-pay

## 2017-04-17 DIAGNOSIS — M25512 Pain in left shoulder: Secondary | ICD-10-CM | POA: Diagnosis not present

## 2017-04-17 DIAGNOSIS — R29898 Other symptoms and signs involving the musculoskeletal system: Secondary | ICD-10-CM

## 2017-04-17 DIAGNOSIS — Z1231 Encounter for screening mammogram for malignant neoplasm of breast: Secondary | ICD-10-CM

## 2017-04-17 DIAGNOSIS — M25612 Stiffness of left shoulder, not elsewhere classified: Secondary | ICD-10-CM

## 2017-04-17 NOTE — Therapy (Signed)
[place], [place], [address] Phone: [phone]   Fax:  [phone]  Occupational Therapy Treatment  Patient Details  Name: Victoria Andrade MRN: [id number] Date of Birth: 10/24/1946 Referring Provider: Dr. Justice Britain  Encounter Date: 04/17/2017      OT End of Session - 04/17/17 1118    Visit Number 8   Number of Visits 16   Date for OT Re-Evaluation 05/24/17  mini reasess on 4/26   Authorization Type [employer]   Authorization Time Period before 10th visit   Authorization - Visit Number 8   Authorization - Number of Visits 10   OT Start Time 0932   OT Stop Time 1115   OT Time Calculation (min) 40 min   Activity Tolerance Patient tolerated treatment well   Behavior During Therapy [facility] for tasks assessed/performed      Past Medical History:  Diagnosis Date  . Anxiety    takes xanax  . Arthritis    fingers and neck  . Cataract immature   . Elevated hemoglobin A1c March 2016   level 6.2  . GERD (gastroesophageal reflux disease)   . Headache(784.0)    migraines  . History of hiatal hernia   . Hypertension   . IBS (irritable bowel syndrome)   . Kidney stones   . Melanoma ([place])    left eye  . Osteopenia 2016  . PONV (postoperative nausea and vomiting)     Past Surgical History:  Procedure Laterality Date  . ABDOMINAL HYSTERECTOMY  1978   partial  . BILATERAL SALPINGOOPHORECTOMY  2010   with robot  . CHOLECYSTECTOMY  2005   lap  . CYSTOSCOPY N/A 03/13/2015   Procedure: CYSTOSCOPY;  Surgeon: Nunzio Cobbs, MD;  Location: [facility];  Service: Gynecology;  Laterality: N/A;  . LITHOTRIPSY     20 years ago  . ROBOTIC ASSISTED SALPINGO OOPHERECTOMY Left 03/13/2015   Procedure: ROBOTIC ASSISTED INCISION OF  LEFT PELVIC MASS, LYSIS OF ADHESIONS  WITH PELVIC WASHINGS;  Surgeon: Nunzio Cobbs, MD;  Location: [facility];  Service: Gynecology;  Laterality: Left;  .  TONSILLECTOMY     as child  . TUBAL LIGATION  1976    There were no vitals filed for this visit.      Subjective Assessment - 04/17/17 1054    Subjective  S: I don't feel as sore as I did last time. Not it just feels a little bruised.   Currently in Pain? Yes   Pain Score 1    Pain Location Shoulder   Pain Orientation Left   Pain Descriptors / Indicators Sore   Pain Type Acute pain   Pain Radiating Towards N/A   Pain Onset In the past 7 days   Pain Frequency Intermittent   Aggravating Factors  end range movement   Pain Relieving Factors rest   Effect of Pain on Daily Activities min effect   Multiple Pain Sites No            [facility] OT Assessment - 04/17/17 1056      Assessment   Diagnosis S/P Left SAD, DCE     Precautions   Precautions None                  OT Treatments/Exercises (OP) - 04/17/17 1056      Exercises   Exercises Shoulder     Shoulder Exercises: Supine   Protraction PROM;5 reps;Strengthening;12  reps   Protraction Weight (lbs) 1   Horizontal ABduction PROM;5 reps;Strengthening;12 reps   Horizontal ABduction Weight (lbs) 1   External Rotation PROM;5 reps;Strengthening;12 reps   External Rotation Weight (lbs) 1   Internal Rotation PROM;5 reps;Strengthening;12 reps   Internal Rotation Weight (lbs) 1   Flexion PROM;5 reps;Strengthening;12 reps   Shoulder Flexion Weight (lbs) 1   ABduction PROM;5 reps;Strengthening;10 reps   Shoulder ABduction Weight (lbs) 1     Shoulder Exercises: Standing   Protraction Strengthening;10 reps   Protraction Weight (lbs) 1   Horizontal ABduction Strengthening;10 reps   Horizontal ABduction Weight (lbs) 1   External Rotation Strengthening;10 reps   External Rotation Weight (lbs) 1   Internal Rotation Strengthening;10 reps   Internal Rotation Weight (lbs) 1   Flexion Strengthening;10 reps   Shoulder Flexion Weight (lbs) 1   ABduction Strengthening;10 reps   Shoulder ABduction Weight (lbs) 1      Shoulder Exercises: ROM/Strengthening   UBE (Upper Arm Bike) Level 1 2' reverse 2' forward   X to V Arms 12X   Proximal Shoulder Strengthening, Supine 10X with 1# no rest breaks   Proximal Shoulder Strengthening, Seated 10X with 1#v no rest breaks   Ball on Wall 1' abduction 1' flexion green ball     Manual Therapy   Manual Therapy Myofascial release   Manual therapy comments completed seperately from all other therapeutic interventions this date   Myofascial Release myofascial release and manual stretching to left upper arm, scapular, and shoulder region to decrease pain and restrictions and improve pain free mobility in her left shoulder region. scar massage and release to surgical incisions                   OT Short Term Goals - 03/26/17 1503      OT SHORT TERM GOAL #1   Title Patient will be educated on an HEP for improving left shoulder movement and strength needed for ADL completion.    Time 4   Period Weeks   Status On-going     OT SHORT TERM GOAL #2   Title Patient will improve left shoulder A/ROM to [facility] for improved ability to lift her left arm for hygiene purposes without pain.    Time 4   Period Weeks   Status On-going     OT SHORT TERM GOAL #3   Title Patient will improve left shoulder  strength to 4/5 for greater ability to reach out to her side or overhead during ADL completion.   Time 4   Period Weeks   Status On-going     OT SHORT TERM GOAL #4   Title Patient will decrease left shoulder pain to 1/10 during reaching to side activities.    Time 4   Period Weeks   Status On-going     OT SHORT TERM GOAL #5   Title Patient will decrease left shoulder restrictions to minimal for greater mobility needed for ADL completion.    Time 4   Period Weeks   Status On-going           OT Long Term Goals - 03/26/17 1503      OT LONG TERM GOAL #1   Title Patient will use left arm with all B/IADLs and leisure activities with minimal pain and difficulty.    Time 8   Period Weeks   Status On-going     OT LONG TERM GOAL #2   Title Patient will have WNL A/ROM in  her left shoulder in order to reach overhead and behind her head during ADL completion.    Time 8   Period Weeks   Status On-going     OT LONG TERM GOAL #3   Title Patient will have 5/5 strength in her left shoulder in order to lift bags of groceries and laundry baskets.    Time 8   Period Weeks   Status On-going     OT LONG TERM GOAL #4   Title Patient will decrease pain in her left shoulder to 1/10 or better during ADL completion.    Time 8   Period Weeks   Status On-going     OT LONG TERM GOAL #5   Title Patient will have trace fascial restrictions in order to have great er mobility needed for ADL completion.    Time 8   Period Weeks   Status On-going               Plan - 04/17/17 1119    Clinical Impression Statement A: Pt feels better this session with less pain/discomfort. Pt was able to complete all strengthening exercises supine and standing. VC for form and technique as needed.    Plan P: Patient has a follow up appointment with MD on Monday afternoon. Complete mini reassessment , update G code, measure for MD and give patient a copy.      Patient will benefit from skilled therapeutic intervention in order to improve the following deficits and impairments:  Impaired UE functional use, Increased fascial restricitons, Decreased range of motion, Pain, Decreased strength  Visit Diagnosis: Acute pain of left shoulder  Stiffness of left shoulder, not elsewhere classified  Other symptoms and signs involving the musculoskeletal system    Problem List Patient Active Problem List   Diagnosis Date Noted  . OTHER DISEASES OF VOCAL CORDS 01/01/2009  . GERD 01/01/2009  . COUGH 01/01/2009     Ailene Ravel, OTR/L,CBIS  [phone]  04/17/2017, 11:21 AM  [facility] [address] [place], [place],  [address] Phone: [phone]   Fax:  [phone]  Name: Victoria Andrade MRN: [id number] Date of Birth: 1946-09-07

## 2017-04-20 ENCOUNTER — Ambulatory Visit (HOSPITAL_COMMUNITY): Payer: Medicare Other | Admitting: Specialist

## 2017-04-20 DIAGNOSIS — M25512 Pain in left shoulder: Secondary | ICD-10-CM

## 2017-04-20 DIAGNOSIS — M25612 Stiffness of left shoulder, not elsewhere classified: Secondary | ICD-10-CM

## 2017-04-20 DIAGNOSIS — R29898 Other symptoms and signs involving the musculoskeletal system: Secondary | ICD-10-CM

## 2017-04-20 NOTE — Therapy (Signed)
[place], [place], [address] Phone: [phone]   Fax:  [phone]  Occupational Therapy Treatment  Patient Details  Name: Victoria Andrade MRN: [id number] Date of Birth: 1946-06-29 Referring Provider: Dr. Justice Britain  Encounter Date: 04/20/2017      OT End of Session - 04/20/17 1130    Visit Number 9   Number of Visits 16   Date for OT Re-Evaluation 05/24/17   Authorization Type [employer]   Authorization Time Period before 19th visit   Authorization - Visit Number 9   Authorization - Number of Visits 19   OT Start Time 1118   OT Stop Time 1200   OT Time Calculation (min) 42 min   Activity Tolerance Patient tolerated treatment well   Behavior During Therapy [facility]FL for tasks assessed/performed      Past Medical History:  Diagnosis Date  . Anxiety    takes xanax  . Arthritis    fingers and neck  . Cataract immature   . Elevated hemoglobin A1c March 2016   level 6.2  . GERD (gastroesophageal reflux disease)   . Headache(784.0)    migraines  . History of hiatal hernia   . Hypertension   . IBS (irritable bowel syndrome)   . Kidney stones   . Melanoma ([place])    left eye  . Osteopenia 2016  . PONV (postoperative nausea and vomiting)     Past Surgical History:  Procedure Laterality Date  . ABDOMINAL HYSTERECTOMY  1978   partial  . BILATERAL SALPINGOOPHORECTOMY  2010   with robot  . CHOLECYSTECTOMY  2005   lap  . CYSTOSCOPY N/A 03/13/2015   Procedure: CYSTOSCOPY;  Surgeon: Nunzio Cobbs, MD;  Location: [facility];  Service: Gynecology;  Laterality: N/A;  . LITHOTRIPSY     20 years ago  . ROBOTIC ASSISTED SALPINGO OOPHERECTOMY Left 03/13/2015   Procedure: ROBOTIC ASSISTED INCISION OF  LEFT PELVIC MASS, LYSIS OF ADHESIONS  WITH PELVIC WASHINGS;  Surgeon: Nunzio Cobbs, MD;  Location: [facility];  Service: Gynecology;  Laterality: Left;  . TONSILLECTOMY     as child  .  TUBAL LIGATION  1976    There were no vitals filed for this visit.      Subjective Assessment - 04/20/17 1129    Subjective  S: I can reach behind my back easier, I just can't quite reach my bra strap.   Currently in Pain? Yes   Pain Score 1    Pain Location Shoulder   Pain Orientation Left   Pain Descriptors / Indicators Aching   Pain Type Acute pain            [facility] OT Assessment - 04/20/17 0001      Assessment   Diagnosis S/P Left SAD, DCE   Referring Provider Dr. Justice Britain   Onset Date 03/10/17     Precautions   Precautions None     ADL   ADL comments increased ability to reach overhead and complete all daily tasks.  most difficult task is reaching behind back to fasten bra.      Observation/Other Assessments   Focus on Therapeutic Outcomes (FOTO)  FOTO 57% independent was 38% at initial evaluation     AROM   Overall AROM Comments Assessed in seated, External rotation and internal rotation with shoulder adducted,   Left Shoulder Flexion 157 Degrees  125   Left Shoulder ABduction 170 Degrees  130   Left Shoulder Internal Rotation 90 Degrees  90 - 60 with shoulder abducted to 90   Left Shoulder External Rotation 75 Degrees  70 - 100 with shoulder abducted to 90     PROM   Overall PROM Comments P/ROM is WNL in supine this date     Strength   Overall Strength Comments assessed in seated, external rotation and internal rotation with shoulder adducted   Left Shoulder Flexion 4+/5  4-/5   Left Shoulder ABduction 4+/5  4-/5   Left Shoulder Internal Rotation 4+/5  4-/5   Left Shoulder External Rotation 4+/5  4-/5                  OT Treatments/Exercises (OP) - 04/20/17 0001      Exercises   Exercises Shoulder     Shoulder Exercises: Standing   Protraction Strengthening;12 reps   Protraction Weight (lbs) 1   Horizontal ABduction Strengthening;12 reps   Horizontal ABduction Weight (lbs) 1   External Rotation Strengthening;12 reps    External Rotation Weight (lbs) 1   Internal Rotation Strengthening;12 reps   Internal Rotation Weight (lbs) 1   Flexion Strengthening;12 reps   Shoulder Flexion Weight (lbs) 1   ABduction Strengthening;12 reps   Shoulder ABduction Weight (lbs) 1     Shoulder Exercises: ROM/Strengthening   UBE (Upper Arm Bike) Level 1 2' reverse 2' forward   Cybex Press 1 plate;15 reps   Cybex Row 1 plate;15 reps   "W" Arms 10 X with 1#   X to V Arms 10 X with 1#   Proximal Shoulder Strengthening, Seated 10X with 1#v no rest breaks     Manual Therapy   Manual Therapy Myofascial release   Manual therapy comments completed seperately from all other therapeutic interventions this date   Myofascial Release myofascial release and manual stretching to left upper arm, scapular, and shoulder region to decrease pain and restrictions and improve pain free mobility in her left shoulder region. scar massage and release to surgical incisions PROM through all shoulder ranges                  OT Short Term Goals - 04/20/17 1151      OT SHORT TERM GOAL #1   Title Patient will be educated on an HEP for improving left shoulder movement and strength needed for ADL completion.    Time 4   Period Weeks   Status On-going     OT SHORT TERM GOAL #2   Title Patient will improve left shoulder A/ROM to [facility] for improved ability to lift her left arm for hygiene purposes without pain.    Time 4   Period Weeks   Status Achieved     OT SHORT TERM GOAL #3   Title Patient will improve left shoulder  strength to 4/5 for greater ability to reach out to her side or overhead during ADL completion.   Time 4   Period Weeks   Status Achieved     OT SHORT TERM GOAL #4   Title Patient will decrease left shoulder pain to 1/10 during reaching to side activities.    Time 4   Period Weeks   Status Achieved     OT SHORT TERM GOAL #5   Title Patient will decrease left shoulder restrictions to minimal for greater mobility  needed for ADL completion.    Time 4   Period Weeks   Status Achieved  OT Long Term Goals - 16-May-2017 1151      OT LONG TERM GOAL #1   Title Patient will use left arm with all B/IADLs and leisure activities with minimal pain and difficulty.   Time 8   Period Weeks   Status On-going     OT LONG TERM GOAL #2   Title Patient will have WNL A/ROM in her left shoulder in order to reach overhead and behind her head during ADL completion.    Time 8   Period Weeks   Status On-going     OT LONG TERM GOAL #3   Title Patient will have 5/5 strength in her left shoulder in order to lift bags of groceries and laundry baskets.    Time 8   Period Weeks   Status On-going     OT LONG TERM GOAL #4   Title Patient will decrease pain in her left shoulder to 1/10 or better during ADL completion.    Time 8   Period Weeks   Status Achieved     OT LONG TERM GOAL #5   Title Patient will have trace fascial restrictions in order to have great er mobility needed for ADL completion.    Time 8   Period Weeks   Status On-going               Plan - May 16, 2017 1147    Clinical Impression Statement A:  Victoria Andrade is making excellent progress towards her goals in occupational therapy.  Her A/ROM and P/ROM has improved significantly, as has her strength.  She has improved ability to comb her hair and reach overhead.  She has continued difficulty lifting items overhead, holding her hairdryer with her left hand, and reaching behind her back to fasten her bra.  She will benefit from continued skilled OT intervention to improve external and internal rotation A/ROM to WNL and improve strength to 5/5 for improved ability to lift items overhead.    OT Frequency 2x / week   OT Duration 4 weeks   OT Treatment/Interventions Self-care/ADL training;Therapeutic exercise;Patient/family education;Ultrasound;Neuromuscular education;Manual Therapy;Splinting;Energy conservation;Therapeutic  exercises;Iontophoresis;Cryotherapy;DME and/or AE instruction;Therapeutic activities;Electrical Stimulation;Moist Heat;Passive range of motion   Plan P:  Attempt 2# weight in supine, add weight to functinal reaching overhead activities.  add tband strengthening of all shoulder ranges to HEP.   Consulted and Agree with Plan of Care Patient      Patient will benefit from skilled therapeutic intervention in order to improve the following deficits and impairments:  Impaired UE functional use, Increased fascial restricitons, Decreased range of motion, Pain, Decreased strength  Visit Diagnosis: Acute pain of left shoulder  Stiffness of left shoulder, not elsewhere classified  Other symptoms and signs involving the musculoskeletal system      G-Codes - 05-16-2017 1153    Functional Assessment Tool Used (Outpatient only) FOTO 57/100 43% impaired   Functional Limitation Carrying, moving and handling objects   Carrying, Moving and Handling Objects Current Status (O9629) At least 40 percent but less than 60 percent impaired, limited or restricted   Carrying, Moving and Handling Objects Goal Status (B2841) At least 20 percent but less than 40 percent impaired, limited or restricted      Problem List Patient Active Problem List   Diagnosis Date Noted  . OTHER DISEASES OF VOCAL CORDS 01/01/2009  . GERD 01/01/2009  . COUGH 01/01/2009    Vangie Bicker, [place], OTR/L [phone]  05/16/2017, 11:57 AM  [facility]  [address] [place], [place], [address] Phone: [phone]   Fax:  [phone]  Name: Victoria Andrade MRN: [id number] Date of Birth: 01-29-1946

## 2017-04-22 ENCOUNTER — Ambulatory Visit (HOSPITAL_COMMUNITY): Payer: Medicare Other

## 2017-04-22 DIAGNOSIS — M25612 Stiffness of left shoulder, not elsewhere classified: Secondary | ICD-10-CM

## 2017-04-22 DIAGNOSIS — R29898 Other symptoms and signs involving the musculoskeletal system: Secondary | ICD-10-CM

## 2017-04-22 DIAGNOSIS — M25512 Pain in left shoulder: Secondary | ICD-10-CM | POA: Diagnosis not present

## 2017-04-22 NOTE — Patient Instructions (Signed)
Wall Internal Rotation  Standing up straight, put hand of affected shoulder behind your back. Press elbow against wall until you feel a stretch in your shoulder. Hold for 15-20 seconds and repeat 2 times.      Towel Internal Rotation Stretch  Hold a towel in both hands. Bring one hand behind your body and with the other, reach behind your neck and use the towel to gently pull the other hand behind the back until you feel a stretch in the shoulder.   Hold for 15-20 seconds. Complete 2 times.    '[place]' [place]  Hands clasped behind head...  swing elbows wide, until you can feel a good stretch in the shoulder.  Hold.    Can be done standing against the wall.  Hold for 15-20 seconds. Complete 2 times.

## 2017-04-22 NOTE — Therapy (Signed)
[place], [place], [address] Phone: [phone]   Fax:  [phone]  Occupational Therapy Treatment  Patient Details  Name: Victoria Andrade MRN: [id number] Date of Birth: [birth year] Referring Provider: Dr. Justice Britain  Encounter Date: 04/22/2017      OT End of Session - 04/22/17 1411    Visit Number 10   Number of Visits 16   Date for OT Re-Evaluation 05/24/17   Authorization Type [employer]   Authorization Time Period before 19th visit   Authorization - Visit Number 10   Authorization - Number of Visits 19   OT Start Time 1123   OT Stop Time 1200   OT Time Calculation (min) 37 min   Activity Tolerance Patient tolerated treatment well   Behavior During Therapy [facility] for tasks assessed/performed      Past Medical History:  Diagnosis Date  . Anxiety    takes xanax  . Arthritis    fingers and neck  . Cataract immature   . Elevated hemoglobin A1c March 2016   level 6.2  . GERD (gastroesophageal reflux disease)   . Headache(784.0)    migraines  . History of hiatal hernia   . Hypertension   . IBS (irritable bowel syndrome)   . Kidney stones   . Melanoma ([place])    left eye  . Osteopenia 2016  . PONV (postoperative nausea and vomiting)     Past Surgical History:  Procedure Laterality Date  . ABDOMINAL HYSTERECTOMY  1978   partial  . BILATERAL SALPINGOOPHORECTOMY  2010   with robot  . CHOLECYSTECTOMY  2005   lap  . CYSTOSCOPY N/A 03/13/2015   Procedure: CYSTOSCOPY;  Surgeon: Nunzio Cobbs, MD;  Location: [facility];  Service: Gynecology;  Laterality: N/A;  . LITHOTRIPSY     20 years ago  . ROBOTIC ASSISTED SALPINGO OOPHERECTOMY Left 03/13/2015   Procedure: ROBOTIC ASSISTED INCISION OF  LEFT PELVIC MASS, LYSIS OF ADHESIONS  WITH PELVIC WASHINGS;  Surgeon: Nunzio Cobbs, MD;  Location: [facility];  Service: Gynecology;  Laterality: Left;  . TONSILLECTOMY     as child  .  TUBAL LIGATION  1976    There were no vitals filed for this visit.      Subjective Assessment - 04/22/17 1408    Subjective  S: It doesn't feel any worse.   Currently in Pain? No/denies                      OT Treatments/Exercises (OP) - 04/22/17 1155      Exercises   Exercises Shoulder     Shoulder Exercises: Supine   Protraction PROM;5 reps;Strengthening;10 reps   Protraction Limitations 2   Horizontal ABduction PROM;5 reps;Strengthening;10 reps   Horizontal ABduction Weight (lbs) 2   External Rotation PROM;5 reps;Strengthening;10 reps   External Rotation Weight (lbs) 2   Internal Rotation PROM;5 reps;Strengthening;10 reps   Internal Rotation Weight (lbs) 2   Flexion PROM;5 reps;Strengthening;10 reps   Shoulder Flexion Weight (lbs) 2   ABduction PROM;5 reps;Strengthening;10 reps   Shoulder ABduction Weight (lbs) 2     Shoulder Exercises: Standing   Protraction Strengthening;10 reps   Protraction Weight (lbs) 2   Horizontal ABduction Strengthening;10 reps   Horizontal ABduction Weight (lbs) 2   External Rotation Strengthening;10 reps   External Rotation Weight (lbs) 2   Internal Rotation Strengthening;10 reps   Internal Rotation Weight (lbs) 2  Flexion Strengthening;10 reps   Shoulder Flexion Weight (lbs) 2   ABduction Strengthening;10 reps   Shoulder ABduction Weight (lbs) 2     Shoulder Exercises: ROM/Strengthening   Over Head Lace 2' with 1/5lb wrist weight   "W" Arms 10 X with 2#     Manual Therapy   Manual Therapy Myofascial release   Manual therapy comments completed seperately from all other therapeutic interventions this date   Myofascial Release myofascial release and manual stretching to left upper arm, scapular, and shoulder region to decrease pain and restrictions and improve pain free mobility in her left shoulder region. scar massage and release to surgical incisions PROM through all shoulder ranges                  OT  Short Term Goals - 04/22/17 1413      OT SHORT TERM GOAL #1   Title Patient will be educated on an HEP for improving left shoulder movement and strength needed for ADL completion.    Time 4   Period Weeks   Status On-going     OT SHORT TERM GOAL #2   Title Patient will improve left shoulder A/ROM to [facility] for improved ability to lift her left arm for hygiene purposes without pain.    Time 4   Period Weeks     OT SHORT TERM GOAL #3   Title Patient will improve left shoulder  strength to 4/5 for greater ability to reach out to her side or overhead during ADL completion.   Time 4   Period Weeks     OT SHORT TERM GOAL #4   Title Patient will decrease left shoulder pain to 1/10 during reaching to side activities.    Time 4   Period Weeks     OT SHORT TERM GOAL #5   Title Patient will decrease left shoulder restrictions to minimal for greater mobility needed for ADL completion.    Time 4   Period Weeks           OT Long Term Goals - 04/22/17 1413      OT LONG TERM GOAL #1   Title Patient will use left arm with all B/IADLs and leisure activities with minimal pain and difficulty.   Time 8   Period Weeks   Status On-going     OT LONG TERM GOAL #2   Title Patient will have WNL A/ROM in her left shoulder in order to reach overhead and behind her head during ADL completion.    Time 8   Period Weeks   Status On-going     OT LONG TERM GOAL #3   Title Patient will have 5/5 strength in her left shoulder in order to lift bags of groceries and laundry baskets.    Time 8   Period Weeks   Status On-going     OT LONG TERM GOAL #4   Title Patient will decrease pain in her left shoulder to 1/10 or better during ADL completion.    Time 8   Period Weeks     OT LONG TERM GOAL #5   Title Patient will have trace fascial restrictions in order to have great er mobility needed for ADL completion.    Time 8   Period Weeks   Status On-going               Plan - 04/22/17 1411     Clinical Impression Statement A: Unable to add theraband shoulder strengthening due  to time constraint. Patient did progress to 2# hand weight supine and standing with min VC for form and technique. FUnctional reaching task completed with light weight and patient was able to complete with only complaints of her opposite arm fatiguing.    Plan P: Add theraband shoulder strengthening exercises to HEP. Continue to focus on strengthening.       Patient will benefit from skilled therapeutic intervention in order to improve the following deficits and impairments:  Impaired UE functional use, Increased fascial restricitons, Decreased range of motion, Pain, Decreased strength  Visit Diagnosis: Acute pain of left shoulder  Stiffness of left shoulder, not elsewhere classified  Other symptoms and signs involving the musculoskeletal system    Problem List Patient Active Problem List   Diagnosis Date Noted  . OTHER DISEASES OF VOCAL CORDS 01/01/2009  . GERD 01/01/2009  . COUGH 01/01/2009   Ailene Ravel, OTR/L,CBIS  [phone]  04/22/2017, 2:14 PM  [place] [address] [place], [place], [address] Phone: [phone]   Fax:  [phone]  Name: Victoria Andrade MRN: [id number] Date of Birth: 1946/06/05

## 2017-04-27 ENCOUNTER — Ambulatory Visit (HOSPITAL_COMMUNITY): Payer: Medicare Other | Admitting: Specialist

## 2017-04-27 DIAGNOSIS — M25612 Stiffness of left shoulder, not elsewhere classified: Secondary | ICD-10-CM

## 2017-04-27 DIAGNOSIS — R29898 Other symptoms and signs involving the musculoskeletal system: Secondary | ICD-10-CM

## 2017-04-27 DIAGNOSIS — M25512 Pain in left shoulder: Secondary | ICD-10-CM

## 2017-04-27 NOTE — Therapy (Signed)
[place], [place], [address] Phone: [phone]   Fax:  [phone]  Occupational Therapy Treatment  Patient Details  Name: Victoria Andrade MRN: [id number] Date of Birth: [birth year] Referring Provider: Dr. Justice Britain  Encounter Date: 04/27/2017      OT End of Session - 04/27/17 1129    Visit Number 11   Number of Visits 16   Date for OT Re-Evaluation 05/24/17   Authorization Type [employer]   Authorization Time Period before 19th visit   Authorization - Visit Number 11   Authorization - Number of Visits 19   OT Start Time 1128   OT Stop Time 1209   OT Time Calculation (min) 41 min   Activity Tolerance Patient tolerated treatment well   Behavior During Therapy [facility] for tasks assessed/performed      Past Medical History:  Diagnosis Date  . Anxiety    takes xanax  . Arthritis    fingers and neck  . Cataract immature   . Elevated hemoglobin A1c March 2016   level 6.2  . GERD (gastroesophageal reflux disease)   . Headache(784.0)    migraines  . History of hiatal hernia   . Hypertension   . IBS (irritable bowel syndrome)   . Kidney stones   . Melanoma ([place])    left eye  . Osteopenia 2016  . PONV (postoperative nausea and vomiting)     Past Surgical History:  Procedure Laterality Date  . ABDOMINAL HYSTERECTOMY  1978   partial  . BILATERAL SALPINGOOPHORECTOMY  2010   with robot  . CHOLECYSTECTOMY  2005   lap  . CYSTOSCOPY N/A 03/13/2015   Procedure: CYSTOSCOPY;  Surgeon: Nunzio Cobbs, MD;  Location: [facility];  Service: Gynecology;  Laterality: N/A;  . LITHOTRIPSY     20 years ago  . ROBOTIC ASSISTED SALPINGO OOPHERECTOMY Left 03/13/2015   Procedure: ROBOTIC ASSISTED INCISION OF  LEFT PELVIC MASS, LYSIS OF ADHESIONS  WITH PELVIC WASHINGS;  Surgeon: Nunzio Cobbs, MD;  Location: [facility];  Service: Gynecology;  Laterality: Left;  . TONSILLECTOMY     as child  .  TUBAL LIGATION  1976    There were no vitals filed for this visit.      Subjective Assessment - 04/27/17 1128    Subjective  S:  I am a little wobbly, I took the wrong pills today.   Currently in Pain? No/denies   Pain Score 0-No pain            [facility] OT Assessment - 04/27/17 0001      Assessment   Diagnosis S/P Left SAD, DCE     Precautions   Precautions None                  OT Treatments/Exercises (OP) - 04/27/17 0001      Exercises   Exercises Shoulder     Shoulder Exercises: Supine   Protraction --   Protraction Limitations --     Shoulder Exercises: Standing   Protraction 15 reps;Strengthening   Protraction Weight (lbs) 2   Horizontal ABduction Strengthening;15 reps   Horizontal ABduction Weight (lbs) 2   External Rotation Strengthening;15 reps   External Rotation Weight (lbs) 2   Internal Rotation Strengthening;15 reps   Internal Rotation Weight (lbs) 2   Flexion Strengthening;15 reps   Shoulder Flexion Weight (lbs) 2   ABduction Strengthening;15 reps   Shoulder ABduction Weight (  lbs) 2   Extension Theraband;15 reps   Theraband Level (Shoulder Extension) Level 3 (Green)   Row Theraband;10 reps   Theraband Level (Shoulder Row) Level 3 (Green)   Retraction Theraband;10 reps   Theraband Level (Shoulder Retraction) Level 3 (Green)     Shoulder Exercises: ROM/Strengthening   UBE (Upper Arm Bike) Level 1 3' reverse 3' forwardat 5.0 speed   "W" Arms 10 times without resistance and max vg for technique   X to V Arms 10 times with 2#   Proximal Shoulder Strengthening, Seated 10 times with 2#   Ball on Wall 1'15" flexion, 45" abduction    Other ROM/Strengthening Exercises passed small weighted ball behind back 5 times rightand 5 times left, then therapist held ball at bra strap height and patient tapped ball 5 times with max difficulty     Manual Therapy   Manual Therapy Myofascial release   Manual therapy comments completed seperately from all  other therapeutic interventions this date   Myofascial Release myofascial release and manual stretching to left upper arm, scapular, and shoulder region to decrease pain and restrictions and improve pain free mobility in her left shoulder region. scar massage and release to surgical incisions PROM through all shoulder ranges                  OT Short Term Goals - 04/22/17 1413      OT SHORT TERM GOAL #1   Title Patient will be educated on an HEP for improving left shoulder movement and strength needed for ADL completion.    Time 4   Period Weeks   Status On-going     OT SHORT TERM GOAL #2   Title Patient will improve left shoulder A/ROM to [facility] for improved ability to lift her left arm for hygiene purposes without pain.    Time 4   Period Weeks     OT SHORT TERM GOAL #3   Title Patient will improve left shoulder  strength to 4/5 for greater ability to reach out to her side or overhead during ADL completion.   Time 4   Period Weeks     OT SHORT TERM GOAL #4   Title Patient will decrease left shoulder pain to 1/10 during reaching to side activities.    Time 4   Period Weeks     OT SHORT TERM GOAL #5   Title Patient will decrease left shoulder restrictions to minimal for greater mobility needed for ADL completion.    Time 4   Period Weeks           OT Long Term Goals - 04/22/17 1413      OT LONG TERM GOAL #1   Title Patient will use left arm with all B/IADLs and leisure activities with minimal pain and difficulty.   Time 8   Period Weeks   Status On-going     OT LONG TERM GOAL #2   Title Patient will have WNL A/ROM in her left shoulder in order to reach overhead and behind her head during ADL completion.    Time 8   Period Weeks   Status On-going     OT LONG TERM GOAL #3   Title Patient will have 5/5 strength in her left shoulder in order to lift bags of groceries and laundry baskets.    Time 8   Period Weeks   Status On-going     OT LONG TERM GOAL #4    Title Patient will decrease  pain in her left shoulder to 1/10 or better during ADL completion.    Time 8   Period Weeks     OT LONG TERM GOAL #5   Title Patient will have trace fascial restrictions in order to have great er mobility needed for ADL completion.    Time 8   Period Weeks   Status On-going               Plan - 04/27/17 1206    Clinical Impression Statement A:  patient had max difficulty with ball on wall in abduction this date.  tenderness noted in periscapular region.    Plan P:  attempt sidelying strengthening and increase ube to 2.0 resistance.    Consulted and Agree with Plan of Care Patient      Patient will benefit from skilled therapeutic intervention in order to improve the following deficits and impairments:  Impaired UE functional use, Increased fascial restricitons, Decreased range of motion, Pain, Decreased strength  Visit Diagnosis: Acute pain of left shoulder  Stiffness of left shoulder, not elsewhere classified  Other symptoms and signs involving the musculoskeletal system    Problem List Patient Active Problem List   Diagnosis Date Noted  . OTHER DISEASES OF VOCAL CORDS 01/01/2009  . GERD 01/01/2009  . COUGH 01/01/2009    Vangie Bicker, [place], OTR/L [phone]  04/27/2017, 12:08 PM  [facility] [address] [place], [place], [address] Phone: [phone]   Fax:  [phone]  Name: Victoria Andrade MRN: [id number] Date of Birth: 22-Aug-1946

## 2017-04-29 ENCOUNTER — Encounter (HOSPITAL_COMMUNITY): Payer: Self-pay

## 2017-04-29 ENCOUNTER — Ambulatory Visit (HOSPITAL_COMMUNITY): Payer: Medicare Other | Attending: Physician Assistant

## 2017-04-29 DIAGNOSIS — M25512 Pain in left shoulder: Secondary | ICD-10-CM | POA: Diagnosis present

## 2017-04-29 DIAGNOSIS — M25612 Stiffness of left shoulder, not elsewhere classified: Secondary | ICD-10-CM | POA: Insufficient documentation

## 2017-04-29 DIAGNOSIS — R29898 Other symptoms and signs involving the musculoskeletal system: Secondary | ICD-10-CM | POA: Diagnosis present

## 2017-04-29 NOTE — Therapy (Signed)
[place], [place], [address] Phone: [phone]   Fax:  [phone]  Occupational Therapy Treatment  Patient Details  Name: Victoria Andrade MRN: [id number] Date of Birth: Nov 16, 1946 Referring Provider: Dr. Justice Britain  Encounter Date: 04/29/2017      OT End of Session - 04/29/17 1154    Visit Number 12   Number of Visits 16   Date for OT Re-Evaluation 05/24/17   Authorization Type [employer]   Authorization Time Period before 19th visit   Authorization - Visit Number 12   Authorization - Number of Visits 19   OT Start Time 1118   OT Stop Time 1200   OT Time Calculation (min) 42 min   Activity Tolerance Patient tolerated treatment well   Behavior During Therapy [facility] for tasks assessed/performed      Past Medical History:  Diagnosis Date  . Anxiety    takes xanax  . Arthritis    fingers and neck  . Cataract immature   . Elevated hemoglobin A1c March 2016   level 6.2  . GERD (gastroesophageal reflux disease)   . Headache(784.0)    migraines  . History of hiatal hernia   . Hypertension   . IBS (irritable bowel syndrome)   . Kidney stones   . Melanoma ([place])    left eye  . Osteopenia 2016  . PONV (postoperative nausea and vomiting)     Past Surgical History:  Procedure Laterality Date  . ABDOMINAL HYSTERECTOMY  1978   partial  . BILATERAL SALPINGOOPHORECTOMY  2010   with robot  . CHOLECYSTECTOMY  2005   lap  . CYSTOSCOPY N/A 03/13/2015   Procedure: CYSTOSCOPY;  Surgeon: Nunzio Cobbs, MD;  Location: [facility];  Service: Gynecology;  Laterality: N/A;  . LITHOTRIPSY     20 years ago  . ROBOTIC ASSISTED SALPINGO OOPHERECTOMY Left 03/13/2015   Procedure: ROBOTIC ASSISTED INCISION OF  LEFT PELVIC MASS, LYSIS OF ADHESIONS  WITH PELVIC WASHINGS;  Surgeon: Nunzio Cobbs, MD;  Location: [facility];  Service: Gynecology;  Laterality: Left;  . TONSILLECTOMY     as child  .  TUBAL LIGATION  1976    There were no vitals filed for this visit.      Subjective Assessment - 04/29/17 1134    Subjective  S: I'm just a little sore.   Currently in Pain? Yes   Pain Score 1    Pain Location Shoulder   Pain Orientation Left   Pain Descriptors / Indicators Sore   Pain Type Acute pain   Pain Radiating Towards N/A   Pain Onset In the past 7 days   Pain Frequency Constant   Aggravating Factors  movement and use   Pain Relieving Factors rest   Effect of Pain on Daily Activities min effect            [facility] OT Assessment - 04/29/17 1136      Assessment   Diagnosis S/P Left SAD, DCE     Precautions   Precautions None                  OT Treatments/Exercises (OP) - 04/29/17 1136      Exercises   Exercises Shoulder     Shoulder Exercises: Supine   Protraction PROM;5 reps   Horizontal ABduction PROM;5 reps   External Rotation PROM;5 reps   Internal Rotation PROM;5 reps   Flexion PROM;5 reps  ABduction PROM;5 reps     Shoulder Exercises: Sidelying   External Rotation --   External Rotation Weight (lbs) 2   Internal Rotation Strengthening;15 reps   Internal Rotation Weight (lbs) 2   Flexion Strengthening;15 reps   Flexion Weight (lbs) 2   ABduction Strengthening;15 reps   ABduction Weight (lbs) 2   Other Sidelying Exercises Protraction; 15X; 2#     Shoulder Exercises: ROM/Strengthening   UBE (Upper Arm Bike) Level 2 3' reverse 3' forward    X to V Arms 12 times with 2#   Proximal Shoulder Strengthening, Seated 10 times with 2#. 1 rest break     Manual Therapy   Manual Therapy Myofascial release   Manual therapy comments completed seperately from all other therapeutic interventions this date   Myofascial Release myofascial release and manual stretching to left upper arm, scapular, and shoulder region to decrease pain and restrictions and improve pain free mobility in her left shoulder region. scar massage and release to surgical  incisions PROM through all shoulder ranges                OT Education - 04/29/17 1204    Education provided Yes   Education Details external rotation stretche   Person(s) Educated Patient   Methods Explanation;Tactile cues;Verbal cues;Demonstration   Comprehension Verbalized understanding;Returned demonstration          OT Short Term Goals - 04/22/17 1413      OT SHORT TERM GOAL #1   Title Patient will be educated on an HEP for improving left shoulder movement and strength needed for ADL completion.    Time 4   Period Weeks   Status On-going     OT SHORT TERM GOAL #2   Title Patient will improve left shoulder A/ROM to [facility] for improved ability to lift her left arm for hygiene purposes without pain.    Time 4   Period Weeks     OT SHORT TERM GOAL #3   Title Patient will improve left shoulder  strength to 4/5 for greater ability to reach out to her side or overhead during ADL completion.   Time 4   Period Weeks     OT SHORT TERM GOAL #4   Title Patient will decrease left shoulder pain to 1/10 during reaching to side activities.    Time 4   Period Weeks     OT SHORT TERM GOAL #5   Title Patient will decrease left shoulder restrictions to minimal for greater mobility needed for ADL completion.    Time 4   Period Weeks           OT Long Term Goals - 04/22/17 1413      OT LONG TERM GOAL #1   Title Patient will use left arm with all B/IADLs and leisure activities with minimal pain and difficulty.   Time 8   Period Weeks   Status On-going     OT LONG TERM GOAL #2   Title Patient will have WNL A/ROM in her left shoulder in order to reach overhead and behind her head during ADL completion.    Time 8   Period Weeks   Status On-going     OT LONG TERM GOAL #3   Title Patient will have 5/5 strength in her left shoulder in order to lift bags of groceries and laundry baskets.    Time 8   Period Weeks   Status On-going     OT LONG TERM GOAL #  4   Title  Patient will decrease pain in her left shoulder to 1/10 or better during ADL completion.    Time 8   Period Weeks     OT LONG TERM GOAL #5   Title Patient will have trace fascial restrictions in order to have great er mobility needed for ADL completion.    Time 8   Period Weeks   Status On-going               Plan - 04/29/17 1154    Clinical Impression Statement A: Pectoralis muscle release completed to increase external rotation passive and active. patient was educated on completing external rotation stretch using door frame/wall.    Plan P: continue with strengthening. patient mentioned that next week may be her last week.       Patient will benefit from skilled therapeutic intervention in order to improve the following deficits and impairments:  Impaired UE functional use, Increased fascial restricitons, Decreased range of motion, Pain, Decreased strength  Visit Diagnosis: Acute pain of left shoulder  Stiffness of left shoulder, not elsewhere classified  Other symptoms and signs involving the musculoskeletal system    Problem List Patient Active Problem List   Diagnosis Date Noted  . OTHER DISEASES OF VOCAL CORDS 01/01/2009  . GERD 01/01/2009  . COUGH 01/01/2009   Ailene Ravel, OTR/L,CBIS  [phone]  04/29/2017, 12:06 PM  [facility] [address] [place], [place], [address] Phone: [phone]   Fax:  [phone]  Name: Victoria Andrade MRN: [id number] Date of Birth: Jul 11, 1946

## 2017-05-04 ENCOUNTER — Ambulatory Visit (HOSPITAL_COMMUNITY): Payer: Medicare Other | Admitting: Occupational Therapy

## 2017-05-04 ENCOUNTER — Encounter (HOSPITAL_COMMUNITY): Payer: Self-pay | Admitting: Occupational Therapy

## 2017-05-04 DIAGNOSIS — M25512 Pain in left shoulder: Secondary | ICD-10-CM

## 2017-05-04 DIAGNOSIS — R29898 Other symptoms and signs involving the musculoskeletal system: Secondary | ICD-10-CM

## 2017-05-04 NOTE — Therapy (Signed)
[place], [place], [address] Phone: [phone]   Fax:  [phone]  Occupational Therapy Reassessment, Treatment, and Discharge Summary  Patient Details  Name: Victoria Andrade MRN: [id number] Date of Birth: 28-May-1946 Referring Provider: Dr. Justice Britain  Encounter Date: 05/04/2017      OT End of Session - 05/04/17 1158    Visit Number 13   Number of Visits 16   Date for OT Re-Evaluation 05/24/17   Authorization Type [employer]   Authorization Time Period before 19th visit   Authorization - Visit Number 13   Authorization - Number of Visits 19   OT Start Time 1117   OT Stop Time 1201   OT Time Calculation (min) 44 min   Activity Tolerance Patient tolerated treatment well   Behavior During Therapy [facility] for tasks assessed/performed      Past Medical History:  Diagnosis Date  . Anxiety    takes xanax  . Arthritis    fingers and neck  . Cataract immature   . Elevated hemoglobin A1c March 2016   level 6.2  . GERD (gastroesophageal reflux disease)   . Headache(784.0)    migraines  . History of hiatal hernia   . Hypertension   . IBS (irritable bowel syndrome)   . Kidney stones   . Melanoma ([place])    left eye  . Osteopenia 2016  . PONV (postoperative nausea and vomiting)     Past Surgical History:  Procedure Laterality Date  . ABDOMINAL HYSTERECTOMY  1978   partial  . BILATERAL SALPINGOOPHORECTOMY  2010   with robot  . CHOLECYSTECTOMY  2005   lap  . CYSTOSCOPY N/A 03/13/2015   Procedure: CYSTOSCOPY;  Surgeon: Nunzio Cobbs, MD;  Location: [facility];  Service: Gynecology;  Laterality: N/A;  . LITHOTRIPSY     20 years ago  . ROBOTIC ASSISTED SALPINGO OOPHERECTOMY Left 03/13/2015   Procedure: ROBOTIC ASSISTED INCISION OF  LEFT PELVIC MASS, LYSIS OF ADHESIONS  WITH PELVIC WASHINGS;  Surgeon: Nunzio Cobbs, MD;  Location: [facility];  Service: Gynecology;  Laterality: Left;   . TONSILLECTOMY     as child  . TUBAL LIGATION  1976    There were no vitals filed for this visit.      Subjective Assessment - 05/04/17 1117    Subjective  S: Today will be my last day.    Currently in Pain? No/denies            [facility] OT Assessment - 05/04/17 1117      Assessment   Diagnosis S/P Left SAD, DCE     Precautions   Precautions None     AROM   Overall AROM Comments Assessed in seated, External rotation and internal rotation with shoulder adducted,   Left Shoulder Flexion 157 Degrees  same as previous   Left Shoulder ABduction 170 Degrees  same as previous   Left Shoulder Internal Rotation 90 Degrees  same as previous   Left Shoulder External Rotation 75 Degrees  same as previous     PROM   Overall PROM Comments P/ROM is WNL in supine this date     Strength   Overall Strength Comments assessed in seated, external rotation and internal rotation with shoulder adducted   Left Shoulder Flexion 4+/5  same as previous   Left Shoulder ABduction 4+/5  same as previous   Left Shoulder Internal Rotation 4+/5  same as  previous   Left Shoulder External Rotation 4+/5  same as previous                  OT Treatments/Exercises (OP) - 05/04/17 1120      Exercises   Exercises Shoulder     Shoulder Exercises: Supine   Protraction PROM;5 reps   Horizontal ABduction PROM;5 reps   External Rotation PROM;5 reps   Internal Rotation PROM;5 reps   Flexion PROM;5 reps   ABduction PROM;5 reps     Shoulder Exercises: Standing   Other Standing Exercises green theraband strengthening: er/IR, horizontal abduction, PNF patterns, 10X each      Shoulder Exercises: ROM/Strengthening   UBE (Upper Arm Bike) Level 2 3' reverse 3' forward    Cybex Press 2 plate;10 reps   Cybex Row 2 plate;10 reps     Manual Therapy   Manual Therapy Myofascial release   Manual therapy comments completed seperately from all other therapeutic interventions this date    Myofascial Release myofascial release and manual stretching to left upper arm, scapular, and shoulder region to decrease pain and restrictions and improve pain free mobility in her left shoulder region. scar massage and release to surgical incisions PROM through all shoulder ranges                OT Education - 05/04/17 1137    Education provided Yes   Education Details shoulder strengthening   Person(s) Educated Patient   Methods Explanation;Demonstration;Handout   Comprehension Verbalized understanding;Returned demonstration          OT Short Term Goals - 05/04/17 1202      OT SHORT TERM GOAL #1   Title Patient will be educated on an HEP for improving left shoulder movement and strength needed for ADL completion.    Time 4   Period Weeks   Status Achieved     OT SHORT TERM GOAL #2   Title Patient will improve left shoulder A/ROM to [facility] for improved ability to lift her left arm for hygiene purposes without pain.    Time 4   Period Weeks     OT SHORT TERM GOAL #3   Title Patient will improve left shoulder  strength to 4/5 for greater ability to reach out to her side or overhead during ADL completion.   Time 4   Period Weeks     OT SHORT TERM GOAL #4   Title Patient will decrease left shoulder pain to 1/10 during reaching to side activities.    Time 4   Period Weeks     OT SHORT TERM GOAL #5   Title Patient will decrease left shoulder restrictions to minimal for greater mobility needed for ADL completion.    Time 4   Period Weeks           OT Long Term Goals - 05/04/17 1202      OT LONG TERM GOAL #1   Title Patient will use left arm with all B/IADLs and leisure activities with minimal pain and difficulty.   Time 8   Period Weeks   Status Achieved     OT LONG TERM GOAL #2   Title Patient will have WNL A/ROM in her left shoulder in order to reach overhead and behind her head during ADL completion.    Time 8   Period Weeks   Status Partially Met      OT LONG TERM GOAL #3   Title Patient will have 5/5 strength in her  left shoulder in order to lift bags of groceries and laundry baskets.    Time 8   Period Weeks   Status Not Met     OT LONG TERM GOAL #4   Title Patient will decrease pain in her left shoulder to 1/10 or better during ADL completion.    Time 8   Period Weeks   Status Achieved     OT LONG TERM GOAL #5   Title Patient will have trace fascial restrictions in order to have great er mobility needed for ADL completion.    Time 8   Period Weeks   Status Not Met               Plan - May 23, 2017 1203    Clinical Impression Statement A: Reassessment completed this session, pt has met all STGs, 2/5 LTGs, and has partially met an additional LTG. Pt is now able to reach her bra strap and is using her LUE for ADL completion. Pt has made improvements in ROM, strength, pain, and fascial restrictions throughout course of therapy. Updated HEP for green theraband strengthening.    Plan P: Discharge pt.    Consulted and Agree with Plan of Care Patient      Patient will benefit from skilled therapeutic intervention in order to improve the following deficits and impairments:  Impaired UE functional use, Increased fascial restricitons, Decreased range of motion, Pain, Decreased strength  Visit Diagnosis: Acute pain of left shoulder  Other symptoms and signs involving the musculoskeletal system      G-Codes - 05-23-2017 1157    Functional Assessment Tool Used (Outpatient only) FOTO Score: 58/100 (42% impairment)   Functional Limitation Carrying, moving and handling objects   Carrying, Moving and Handling Objects Goal Status (A2633) At least 20 percent but less than 40 percent impaired, limited or restricted   Carrying, Moving and Handling Objects Discharge Status [phone]) At least 40 percent but less than 60 percent impaired, limited or restricted      Problem List Patient Active Problem List   Diagnosis Date Noted  . OTHER  DISEASES OF VOCAL CORDS 01/01/2009  . GERD 01/01/2009  . COUGH 01/01/2009   Guadelupe Sabin, OTR/L  [phone] May 23, 2017, 12:05 PM  [facility] [address] [place], [place], [address] Phone: [phone]   Fax:  [phone]  Name: Victoria Andrade MRN: [id number] Date of Birth: 03/08/1946    OCCUPATIONAL THERAPY DISCHARGE SUMMARY  Visits from Start of Care: 13  Current functional level related to goals / functional outcomes: See above. Pt is now using LUE during functional task completion with minimal soreness. Pt reports she is now able to carry bags in her LUE when shopping as well as she is now able to reach behind her into IR for unsnapping her bra.    Remaining deficits: Pt continues to have decreased activity tolerance and fatigues easily.    Education / Equipment: Nyoka Cowden theraband strengthening Plan: Patient agrees to discharge.  Patient goals were partially met. Patient is being discharged due to being pleased with the current functional level.  ?????

## 2017-05-04 NOTE — Patient Instructions (Signed)
Strengthening: Chest Pull - Resisted   Hold Theraband in front of body with hands about shoulder width a part. Pull band a part and back together slowly. Repeat __10-15__ times. Complete __1__ set(s) per session.. Repeat _1-2___ session(s) per day.  http://orth.exer.us/926   Copyright  VHI. All rights reserved.   PNF Strengthening: Resisted   Standing with resistive band around each hand, bring right arm up and away, thumb back. Repeat _10-15___ times per set. Do _1___ sets per session. Do __1-2__ sessions per day.                           Resisted External Rotation: in Neutral - Bilateral   Sit or stand, tubing in both hands, elbows at sides, bent to 90, forearms forward. Pinch shoulder blades together and rotate forearms out. Keep elbows at sides. Repeat _10-15___ times per set. Do _1___ sets per session. Do _1-2___ sessions per day.  http://orth.exer.us/966   Copyright  VHI. All rights reserved.   PNF Strengthening: Resisted   Standing, hold resistive band above head. Bring right arm down and out from side. Repeat _10-15___ times per set. Do __1__ sets per session. Do _1-2___ sessions per day.  http://orth.exer.us/922   Copyright  VHI. All rights reserved.

## 2017-05-06 ENCOUNTER — Ambulatory Visit (HOSPITAL_COMMUNITY): Payer: Medicare Other | Admitting: Specialist

## 2017-05-11 ENCOUNTER — Encounter (HOSPITAL_COMMUNITY): Payer: Medicare Other | Admitting: Specialist

## 2017-05-12 ENCOUNTER — Ambulatory Visit
Admission: RE | Admit: 2017-05-12 | Discharge: 2017-05-12 | Disposition: A | Discharge status: Home / Self Care | Payer: Medicare Other | Source: Ambulatory Visit | Attending: Obstetrics and Gynecology | Admitting: Obstetrics and Gynecology

## 2017-05-12 DIAGNOSIS — Z1231 Encounter for screening mammogram for malignant neoplasm of breast: Secondary | ICD-10-CM

## 2017-05-13 ENCOUNTER — Encounter (HOSPITAL_COMMUNITY): Payer: Medicare Other

## 2017-05-14 ENCOUNTER — Other Ambulatory Visit: Payer: Self-pay | Admitting: Obstetrics and Gynecology

## 2017-05-14 DIAGNOSIS — R928 Other abnormal and inconclusive findings on diagnostic imaging of breast: Secondary | ICD-10-CM

## 2017-05-18 ENCOUNTER — Ambulatory Visit
Admission: RE | Admit: 2017-05-18 | Discharge: 2017-05-18 | Disposition: A | Discharge status: Home / Self Care | Payer: Medicare Other | Source: Ambulatory Visit | Attending: Obstetrics and Gynecology | Admitting: Obstetrics and Gynecology

## 2017-05-18 DIAGNOSIS — R928 Other abnormal and inconclusive findings on diagnostic imaging of breast: Secondary | ICD-10-CM

## 2017-05-19 ENCOUNTER — Encounter (HOSPITAL_COMMUNITY): Payer: Medicare Other

## 2017-05-21 ENCOUNTER — Encounter (HOSPITAL_COMMUNITY): Payer: Medicare Other

## 2017-05-22 ENCOUNTER — Other Ambulatory Visit: Payer: Self-pay | Admitting: Family Medicine

## 2017-05-22 DIAGNOSIS — Z1389 Encounter for screening for other disorder: Secondary | ICD-10-CM

## 2017-05-22 DIAGNOSIS — F419 Anxiety disorder, unspecified: Secondary | ICD-10-CM

## 2017-05-22 DIAGNOSIS — K589 Irritable bowel syndrome without diarrhea: Secondary | ICD-10-CM

## 2017-05-22 DIAGNOSIS — K5900 Constipation, unspecified: Secondary | ICD-10-CM

## 2017-05-22 DIAGNOSIS — F458 Other somatoform disorders: Secondary | ICD-10-CM

## 2017-05-29 ENCOUNTER — Ambulatory Visit
Admission: RE | Admit: 2017-05-29 | Discharge: 2017-05-29 | Disposition: A | Discharge status: Home / Self Care | Payer: Medicare Other | Source: Ambulatory Visit | Attending: Family Medicine | Admitting: Family Medicine

## 2017-05-29 DIAGNOSIS — K589 Irritable bowel syndrome without diarrhea: Secondary | ICD-10-CM

## 2017-05-29 DIAGNOSIS — Z1389 Encounter for screening for other disorder: Secondary | ICD-10-CM

## 2017-05-29 DIAGNOSIS — F458 Other somatoform disorders: Secondary | ICD-10-CM

## 2017-05-29 DIAGNOSIS — K5900 Constipation, unspecified: Secondary | ICD-10-CM

## 2017-05-29 DIAGNOSIS — F419 Anxiety disorder, unspecified: Secondary | ICD-10-CM

## 2017-05-29 MED ORDER — IOPAMIDOL (ISOVUE-300) INJECTION 61%
100.0000 mL | Freq: Once | INTRAVENOUS | Status: AC | PRN
  Administered 2017-05-29: 100 mL via INTRAVENOUS

## 2017-10-16 ENCOUNTER — Encounter: Payer: Self-pay | Admitting: Obstetrics and Gynecology

## 2017-10-16 ENCOUNTER — Ambulatory Visit (INDEPENDENT_AMBULATORY_CARE_PROVIDER_SITE_OTHER): Payer: Medicare Other | Admitting: Obstetrics and Gynecology

## 2017-10-16 VITALS — BP 158/60 | HR 80 | Resp 16 | Ht 61.5 in | Wt 192.0 lb

## 2017-10-16 DIAGNOSIS — Z79818 Long term (current) use of other agents affecting estrogen receptors and estrogen levels: Secondary | ICD-10-CM

## 2017-10-16 DIAGNOSIS — Z01419 Encounter for gynecological examination (general) (routine) without abnormal findings: Secondary | ICD-10-CM

## 2017-10-16 DIAGNOSIS — Z79899 Other long term (current) drug therapy: Secondary | ICD-10-CM | POA: Diagnosis not present

## 2017-10-16 MED ORDER — ESTRADIOL 0.5 MG PO TABS: 0.5000 mg | ORAL_TABLET | Freq: Every day | ORAL | 11 refills | Status: DC

## 2017-10-16 NOTE — Patient Instructions (Signed)

## 2017-10-16 NOTE — Progress Notes (Signed)
71 y.o. G34P0011 Married Caucasian female here for annual exam.    She wants to continue her ERT.  She does not feel like she can decrease this due to hot flashes.  ROS: Weight gain.  LE swelling with itching and burning.  Bloating and constipation.  Urinary leakage with cough/sneeze and night time urination.  Wearing a pad.  States the problem is not significant.  Sees Dr. [name] for kidney stones.  Had CT scan on 05/29/17 and this showed stones. Muscle and joint pain.  Had left shoulder surgery in March.  Now having problems with right shoulder. Anxiety. Taking Xanax prn.   PCP: Sharilyn Sites, MD  Patient's last menstrual period was 12/29/1976 (approximate).           Sexually active: Yes.   female The current method of family planning is status post hysterectomy.    Exercising: Yes.    walking Smoker:  no  Health Maintenance: Pap:  2010 Neg History of abnormal Pap:  no MMG: 05-12-17 Density C/poss.Rt.Br.Mass;Lt.Br.neg.--Diag.Rt.Br.and U/S Multiple cysts and dilated ducts are identified in the subareolar lateral right breast, corresponding with the screening abnormalityof concern/screening 50yr/BiRads2:TBC Colonoscopy: 2014 neg;next due 2024 per GI BMD:  04-20-15 Result  Osteopenia:[place]. Took Fosamax in the past & had reflux with this.  Dr. Hilma Favors following.  TDaP: PCP Gardasil:   no HIV:no Hep C:no Screening Labs:  Hb today: PCP, Urine today: not done   reports that she has never smoked. She has never used smokeless tobacco. She reports that she drinks about 0.6 oz of alcohol per week . She reports that she does not use drugs.  Past Medical History:  Diagnosis Date  . Anxiety    takes xanax  . Arthritis    fingers and neck  . Cataract immature   . Elevated hemoglobin A1c March 2016   level 6.2  . GERD (gastroesophageal reflux disease)   . Headache(784.0)    migraines  . History of hiatal hernia   . Hypertension   . IBS (irritable bowel syndrome)   . Kidney stones    . Macular degeneration   . Melanoma ([place])    left eye  . Osteopenia 2016  . PONV (postoperative nausea and vomiting)     Past Surgical History:  Procedure Laterality Date  . ABDOMINAL HYSTERECTOMY  1978   partial  . BILATERAL SALPINGOOPHORECTOMY  2010   with robot  . BREAST BIOPSY  03/06/2008   benign  . CATARACT EXTRACTION, BILATERAL  2018  . CHOLECYSTECTOMY  2005   lap  . CYSTOSCOPY N/A 03/13/2015   Procedure: CYSTOSCOPY;  Surgeon: Nunzio Cobbs, MD;  Location: [facility];  Service: Gynecology;  Laterality: N/A;  . LITHOTRIPSY     20 years ago  . ROBOTIC ASSISTED SALPINGO OOPHERECTOMY Left 03/13/2015   Procedure: ROBOTIC ASSISTED INCISION OF  LEFT PELVIC MASS, LYSIS OF ADHESIONS  WITH PELVIC WASHINGS;  Surgeon: Nunzio Cobbs, MD;  Location: [facility];  Service: Gynecology;  Laterality: Left;  . SHOULDER SURGERY Left    bone spurs  . TONSILLECTOMY     as child  . TUBAL LIGATION  1976    Current Outpatient Prescriptions  Medication Sig Dispense Refill  . ALPRAZolam (XANAX) 1 MG tablet Take 1 mg by mouth at bedtime as needed for anxiety or sleep. anxiety    . Cholecalciferol (VITAMIN D3) 5000 UNITS CAPS Take 5,000 Units by mouth daily.     . diphenhydrAMINE (BENADRYL) 25 MG  tablet Take 25 mg by mouth at bedtime as needed for allergies or sleep. sleep    . estradiol (ESTRACE) 0.5 MG tablet Take 1 tablet (0.5 mg total) by mouth daily. 30 tablet 11  . famotidine (PEPCID) 20 MG tablet Take 20 mg by mouth as needed. For indigestion    . furosemide (LASIX) 40 MG tablet Take 40 mg by mouth as needed for fluid. For fluid    . hydrochlorothiazide (HYDRODIURIL) 25 MG tablet Take 25 mg by mouth. Takes 1 tablet at breakfast and 1 tablet at lunch    . loratadine (CLARITIN) 10 MG tablet Take 10 mg by mouth daily.    . montelukast (SINGULAIR) 10 MG tablet Take 10 mg by mouth at bedtime.    . naratriptan (AMERGE) 2.5 MG tablet Take 2.5 mg by mouth as needed for migraine. Take  one (1) tablet at onset of headache; if returns or does not resolve, may repeat after 4 hours; do not exceed five (5) mg in 24 hours.     No current facility-administered medications for this visit.     Family History  Problem Relation Age of Onset  . Adopted: Yes  . Hypertension Mother   . Thyroid disease Mother   . Diabetes Maternal Grandmother   . Hypertension Maternal Grandmother   . Heart attack Maternal Grandmother   . Hypertension Maternal Grandfather   . Stroke Maternal Grandfather   . Stroke Maternal Uncle   . Stroke Maternal Uncle   . Heart attack Maternal Uncle   . Heart attack Maternal Uncle   . Lung cancer Maternal Uncle     ROS:  Pertinent items are noted in HPI.  Otherwise, a comprehensive ROS was negative.  Exam:   BP (!) 158/60 (BP Location: Right Arm, Patient Position: Sitting, Cuff Size: Large)   Pulse 80   Resp 16   Ht 5' 1.5" (1.562 m)   Wt 192 lb (87.1 kg)   LMP 12/29/1976 (Approximate)   BMI 35.69 kg/m     General appearance: alert, cooperative and appears stated age Head: Normocephalic, without obvious abnormality, atraumatic Neck: no adenopathy, supple, symmetrical, trachea midline and thyroid normal to inspection and palpation Lungs: clear to auscultation bilaterally Breasts: normal appearance, no masses or tenderness, No nipple retraction or dimpling, No nipple discharge or bleeding, No axillary or supraclavicular adenopathy Heart: regular rate and rhythm Abdomen:obese, soft, non-tender; no masses, no organomegaly Extremities: extremities normal, atraumatic, no cyanosis or edema Skin: Skin color, texture, turgor normal. No rashes or lesions Lymph nodes: Cervical, supraclavicular, and axillary nodes normal. No abnormal inguinal nodes palpated Neurologic: Grossly normal  Pelvic: External genitalia:  no lesions              Urethra:  normal appearing urethra with no masses, tenderness or lesions              Bartholins and Skenes: normal                  Vagina: normal appearing vagina with normal color and discharge, no lesions              Cervix:  Absent.              Pap taken: No. Bimanual Exam:  Uterus:  Absent.              Adnexa: no mass, fullness, tenderness.  Exam limited by [facility].              Rectal exam:  Yes.  .  Confirms.              Anus:  normal sphincter tone, no lesions  Chaperone was present for exam.  Assessment:   Well woman visit with normal exam. Status post TAH.  Status post laparoscopic BSO with robotic approach.  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 Osteopenia.  Followed by PCP. Estrogen therapy patient.  Anxiety. Regular Xanax use.   Plan: Mammogram screening discussed. Recommended self breast awareness. Pap and HR HPV as above. Guidelines for Calcium, Vitamin D, regular exercise program including cardiovascular and weight bearing exercise. Refill of Estrace 0.5 mg daily for one year. We discussed the WHI and increased risk of stroke, DVT, and PE. She wishes to continue with her estrogen tx. BMD through PCP.  Follow up annually and prn.      After visit summary provided.

## 2018-04-28 ENCOUNTER — Other Ambulatory Visit: Payer: Self-pay | Admitting: Obstetrics and Gynecology

## 2018-04-28 DIAGNOSIS — Z1231 Encounter for screening mammogram for malignant neoplasm of breast: Secondary | ICD-10-CM

## 2018-05-18 ENCOUNTER — Ambulatory Visit
Admission: RE | Admit: 2018-05-18 | Discharge: 2018-05-18 | Disposition: A | Discharge status: Home / Self Care | Payer: Medicare Other | Source: Ambulatory Visit | Attending: Obstetrics and Gynecology | Admitting: Obstetrics and Gynecology

## 2018-05-18 DIAGNOSIS — Z1231 Encounter for screening mammogram for malignant neoplasm of breast: Secondary | ICD-10-CM

## 2018-10-25 NOTE — Progress Notes (Signed)
72 y.o. G41P0011 Married Caucasian female here for annual exam.    On ERT.  Wants to continue.   She tried Lexapro and this did not work.  She had tingling and numbness in her feet.   Husband with stage IV pancreatic cancer.  He is doing chemotherapy and doing well overall.   PCP: Sharilyn Sites, MD  Patient's last menstrual period was 12/29/1976 (approximate).          Sexually active: Yes.   female The current method of family planning is status post hysterectomy.    Exercising: No.   Smoker:  no  Health Maintenance: Pap: 2010 Neg History of abnormal Pap:  no MMG: 05-18-18 Neg/density C/BiRads1 Colonoscopy:  2014 neg;next due 2024 per GI BMD: 2016  Result :Osteopenia--took Fosamax in past & had reflux. TDaP:  PCP Gardasil:   no HIV:no Hep C:no Screening Labs:   PCP   reports that she has never smoked. She has never used smokeless tobacco. She reports that she drinks about 1.0 standard drinks of alcohol per week. She reports that she does not use drugs.  Past Medical History:  Diagnosis Date  . Anxiety    takes xanax  . Arthritis    fingers and neck  . Cataract immature   . Elevated hemoglobin A1c March 2016   level 6.2  . GERD (gastroesophageal reflux disease)   . Headache(784.0)    migraines  . History of hiatal hernia   . Hypertension   . IBS (irritable bowel syndrome)   . Kidney stones   . Macular degeneration   . Melanoma ([place])    left eye  . Osteopenia 2016  . PONV (postoperative nausea and vomiting)   . Prediabetes 2019    Past Surgical History:  Procedure Laterality Date  . ABDOMINAL HYSTERECTOMY  1978   partial  . BILATERAL SALPINGOOPHORECTOMY  2010   with robot  . BREAST BIOPSY  03/06/2008   benign  . CATARACT EXTRACTION, BILATERAL  2018  . CHOLECYSTECTOMY  2005   lap  . CYSTOSCOPY N/A 03/13/2015   Procedure: CYSTOSCOPY;  Surgeon: Nunzio Cobbs, MD;  Location: [facility];  Service: Gynecology;  Laterality: N/A;  . LITHOTRIPSY     20  years ago  . ROBOTIC ASSISTED SALPINGO OOPHERECTOMY Left 03/13/2015   Procedure: ROBOTIC ASSISTED INCISION OF  LEFT PELVIC MASS, LYSIS OF ADHESIONS  WITH PELVIC WASHINGS;  Surgeon: Nunzio Cobbs, MD;  Location: [facility];  Service: Gynecology;  Laterality: Left;  . SHOULDER SURGERY Left    bone spurs  . TONSILLECTOMY     as child  . TUBAL LIGATION  1976    Current Outpatient Medications  Medication Sig Dispense Refill  . ALPRAZolam (XANAX) 1 MG tablet Take 1 mg by mouth at bedtime as needed for anxiety or sleep. anxiety    . Cholecalciferol (VITAMIN D3) 5000 UNITS CAPS Take 5,000 Units by mouth daily.     . diphenhydrAMINE (BENADRYL) 25 MG tablet Take 25 mg by mouth at bedtime as needed for allergies or sleep. sleep    . estradiol (ESTRACE) 0.5 MG tablet Take 1 tablet (0.5 mg total) by mouth daily. 30 tablet 11  . famotidine (PEPCID) 20 MG tablet Take 20 mg by mouth as needed. For indigestion    . furosemide (LASIX) 40 MG tablet Take 40 mg by mouth as needed for fluid. For fluid    . hydrochlorothiazide (HYDRODIURIL) 25 MG tablet Take 25 mg by mouth. Takes  1 tablet at breakfast and 1 tablet at lunch    . LINZESS 290 MCG CAPS capsule Take 1 tablet by mouth daily.    Marland Kitchen loratadine (CLARITIN) 10 MG tablet Take 10 mg by mouth daily.    . montelukast (SINGULAIR) 10 MG tablet Take 10 mg by mouth at bedtime.    . naratriptan (AMERGE) 2.5 MG tablet Take 2.5 mg by mouth as needed for migraine. Take one (1) tablet at onset of headache; if returns or does not resolve, may repeat after 4 hours; do not exceed five (5) mg in 24 hours.     No current facility-administered medications for this visit.     Family History  Adopted: Yes  Problem Relation Age of Onset  . Hypertension Mother   . Thyroid disease Mother   . Diabetes Maternal Grandmother   . Hypertension Maternal Grandmother   . Heart attack Maternal Grandmother   . Hypertension Maternal Grandfather   . Stroke Maternal Grandfather    . Stroke Maternal Uncle   . Stroke Maternal Uncle   . Heart attack Maternal Uncle   . Heart attack Maternal Uncle   . Lung cancer Maternal Uncle     Review of Systems  HENT: Positive for hearing loss.   Gastrointestinal:       Bloating  Musculoskeletal: Positive for myalgias.  Skin:       Hair loss  Psychiatric/Behavioral: The patient is nervous/anxious.   All other systems reviewed and are negative.   Exam:   BP 136/66 (BP Location: Right Arm, Patient Position: Sitting, Cuff Size: Large)   Pulse 80   Resp 18   Ht 5' 1.5" (1.562 m)   Wt 195 lb (88.5 kg)   LMP 12/29/1976 (Approximate)   BMI 36.25 kg/m     General appearance: alert, cooperative and appears stated age Head: Normocephalic, without obvious abnormality, atraumatic Neck: no adenopathy, supple, symmetrical, trachea midline and thyroid normal to inspection and palpation Lungs: clear to auscultation bilaterally Breasts: normal appearance, no masses or tenderness, No nipple retraction or dimpling, No nipple discharge or bleeding, No axillary or supraclavicular adenopathy Heart: regular rate and rhythm Abdomen: soft, non-tender; no masses, no organomegaly Extremities: extremities normal, atraumatic, no cyanosis or edema Skin: Skin color, texture, turgor normal. No rashes or lesions Lymph nodes: Cervical, supraclavicular, and axillary nodes normal. No abnormal inguinal nodes palpated Neurologic: Grossly normal  Pelvic: External genitalia:  no lesions              Urethra:  normal appearing urethra with no masses, tenderness or lesions              Bartholins and Skenes: normal                 Vagina: normal appearing vagina with normal color and discharge, no lesions              Cervix: absent              Pap taken: No. Bimanual Exam:  Uterus:   absent              Adnexa: no mass, fullness, tenderness              Rectal exam: Yes.  .  Confirms.              Anus:  normal sphincter tone, no  lesions  Chaperone was present for exam.  Assessment:   Well woman visit with normal exam. Status post TAH.  Status post laparoscopic BSO with robotic approach.  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 Osteopenia.  Followed by PCP. Estrogen therapy patient.  Anxiety. Regular Xanax use.   Plan: Mammogram screening. Recommended self breast awareness. Pap and HR HPV as above. Guidelines for Calcium, Vitamin D, regular exercise program including cardiovascular and weight bearing exercise. Refill of ERT for one year.  Discused WHI and use of HRT which can increase risk of PE, DVT, stroke. Support given for the patient for her husband's cancer diagnosis. Follow up annually and prn.     After visit summary provided.

## 2018-10-27 ENCOUNTER — Ambulatory Visit: Payer: Medicare Other | Admitting: Obstetrics and Gynecology

## 2018-10-27 ENCOUNTER — Encounter: Payer: Self-pay | Admitting: Obstetrics and Gynecology

## 2018-10-27 ENCOUNTER — Other Ambulatory Visit: Payer: Self-pay

## 2018-10-27 VITALS — BP 136/66 | HR 80 | Resp 18 | Ht 61.5 in | Wt 195.0 lb

## 2018-10-27 DIAGNOSIS — Z01419 Encounter for gynecological examination (general) (routine) without abnormal findings: Secondary | ICD-10-CM | POA: Diagnosis not present

## 2018-10-27 MED ORDER — ESTRADIOL 0.5 MG PO TABS: 0.5000 mg | ORAL_TABLET | Freq: Every day | ORAL | 3 refills | Status: DC

## 2018-10-27 NOTE — Patient Instructions (Signed)

## 2019-03-25 ENCOUNTER — Other Ambulatory Visit: Payer: Self-pay

## 2019-03-25 ENCOUNTER — Other Ambulatory Visit (HOSPITAL_COMMUNITY): Payer: Self-pay | Admitting: Family Medicine

## 2019-03-25 ENCOUNTER — Ambulatory Visit (HOSPITAL_COMMUNITY)
Admission: RE | Admit: 2019-03-25 | Discharge: 2019-03-25 | Disposition: A | Discharge status: Home / Self Care | Payer: Medicare Other | Source: Ambulatory Visit | Attending: Family Medicine | Admitting: Family Medicine

## 2019-03-25 DIAGNOSIS — N200 Calculus of kidney: Secondary | ICD-10-CM | POA: Diagnosis not present

## 2019-05-02 ENCOUNTER — Other Ambulatory Visit: Payer: Self-pay | Admitting: Obstetrics and Gynecology

## 2019-05-02 DIAGNOSIS — Z1231 Encounter for screening mammogram for malignant neoplasm of breast: Secondary | ICD-10-CM

## 2019-06-24 ENCOUNTER — Other Ambulatory Visit: Payer: Self-pay

## 2019-06-24 ENCOUNTER — Ambulatory Visit
Admission: RE | Admit: 2019-06-24 | Discharge: 2019-06-24 | Disposition: A | Discharge status: Home / Self Care | Payer: Medicare Other | Source: Ambulatory Visit | Attending: Obstetrics and Gynecology | Admitting: Obstetrics and Gynecology

## 2019-06-24 DIAGNOSIS — Z1231 Encounter for screening mammogram for malignant neoplasm of breast: Secondary | ICD-10-CM

## 2019-09-06 ENCOUNTER — Other Ambulatory Visit: Payer: Self-pay | Admitting: Obstetrics and Gynecology

## 2019-10-27 ENCOUNTER — Other Ambulatory Visit: Payer: Self-pay

## 2019-10-27 NOTE — Progress Notes (Signed)
73 y.o. G48P0011 Widowed Caucasian female here for annual exam.    Ran out of estrogen pills last week.  Not noticing a big difference.   She states she is having moles on her bottom.   Taking Cymbalta for depression since spring.   Still with kidney stones.   Has an appointment with urology.   Husband died this summer.  Married for 50 years.  Son is helping a lot.  Moved in to a town home.   PCP:   Sharilyn Sites, MD  Patient's last menstrual period was 12/29/1976 (approximate).           Sexually active: No.  The current method of family planning is status post hysterectomy.Husband passed 07-02-19 from pancreatic cancer.    Exercising: Yes.    recumbent bike and some walking Smoker:  no  Health Maintenance: Pap: 2010 Neg History of abnormal Pap:  no MMG: 06-24-19 3D/Neg/density C/BiRads1 Colonoscopy: 2014 neg;next due 2024 per GI BMD: 2016  Result :Osteopenia--took Fosamax in past & had reflux. TDaP:  PCP Gardasil:   n/a HIV:no Hep C:no Screening Labs:  PCP Flu vaccine:  Recommended.    reports that she has never smoked. She has never used smokeless tobacco. She reports current alcohol use of about 1.0 standard drinks of alcohol per week. She reports that she does not use drugs.  Past Medical History:  Diagnosis Date  . Anxiety    takes xanax  . Arthritis    fingers and neck  . Cataract immature   . Elevated hemoglobin A1c March 2016   level 6.2  . GERD (gastroesophageal reflux disease)   . Headache(784.0)    migraines  . History of hiatal hernia   . Hypertension   . IBS (irritable bowel syndrome)   . Kidney stones   . Macular degeneration   . Melanoma ([place])    left eye  . Osteopenia 2016  . PONV (postoperative nausea and vomiting)   . Prediabetes 2019    Past Surgical History:  Procedure Laterality Date  . ABDOMINAL HYSTERECTOMY  1978   partial  . BILATERAL SALPINGOOPHORECTOMY  2010   with robot  . BREAST BIOPSY Right 03/06/2008   benign  .  CATARACT EXTRACTION, BILATERAL  2018  . CHOLECYSTECTOMY  2005   lap  . CYSTOSCOPY N/A 03/13/2015   Procedure: CYSTOSCOPY;  Surgeon: Nunzio Cobbs, MD;  Location: [facility];  Service: Gynecology;  Laterality: N/A;  . LITHOTRIPSY     20 years ago  . ROBOTIC ASSISTED SALPINGO OOPHERECTOMY Left 03/13/2015   Procedure: ROBOTIC ASSISTED INCISION OF  LEFT PELVIC MASS, LYSIS OF ADHESIONS  WITH PELVIC WASHINGS;  Surgeon: Nunzio Cobbs, MD;  Location: [facility];  Service: Gynecology;  Laterality: Left;  . SHOULDER SURGERY Left    bone spurs  . TONSILLECTOMY     as child  . TUBAL LIGATION  1976    Current Outpatient Medications  Medication Sig Dispense Refill  . ALPRAZolam (XANAX) 1 MG tablet Take 1 mg by mouth at bedtime as needed for anxiety or sleep. anxiety    . Cholecalciferol (VITAMIN D3) 5000 UNITS CAPS Take 5,000 Units by mouth daily.     Marland Kitchen dicyclomine (BENTYL) 20 MG tablet Take 20 mg by mouth 3 (three) times daily as needed.    . diphenhydrAMINE (BENADRYL) 25 MG tablet Take 25 mg by mouth at bedtime as needed for allergies or sleep. sleep    . DULoxetine (CYMBALTA) 30 MG capsule  Take 30 mg by mouth daily.    Marland Kitchen estradiol (ESTRACE) 0.5 MG tablet Take 1 tablet (0.5 mg total) by mouth daily. 90 tablet 3  . famotidine (PEPCID) 20 MG tablet Take 20 mg by mouth as needed. For indigestion    . hydrochlorothiazide (HYDRODIURIL) 25 MG tablet Take 25 mg by mouth. Takes 1 tablet at breakfast and 1 tablet at lunch    . LINZESS 290 MCG CAPS capsule Take 1 tablet by mouth daily.    Marland Kitchen loratadine (CLARITIN) 10 MG tablet Take 10 mg by mouth daily.    . montelukast (SINGULAIR) 10 MG tablet Take 10 mg by mouth at bedtime.    . triamcinolone cream (KENALOG) 0.1 % APPLY TO AFFECTED AREAUTWICE DIALY.     No current facility-administered medications for this visit.     Family History  Adopted: Yes  Problem Relation Age of Onset  . Hypertension Mother   . Thyroid disease Mother   . Diabetes  Maternal Grandmother   . Hypertension Maternal Grandmother   . Heart attack Maternal Grandmother   . Hypertension Maternal Grandfather   . Stroke Maternal Grandfather   . Stroke Maternal Uncle   . Stroke Maternal Uncle   . Heart attack Maternal Uncle   . Heart attack Maternal Uncle   . Lung cancer Maternal Uncle     Review of Systems  All other systems reviewed and are negative.   Exam:   BP (!) 142/82 (Cuff Size: Large)   Pulse 84   Temp (!) 97.5 F (36.4 C) (Temporal)   Resp 14   Ht 5\' 1"  (1.549 m)   Wt 184 lb 9.6 oz (83.7 kg)   LMP 12/29/1976 (Approximate)   BMI 34.88 kg/m     General appearance: alert, cooperative and appears stated age Head: normocephalic, without obvious abnormality, atraumatic Neck: no adenopathy, supple, symmetrical, trachea midline and thyroid normal to inspection and palpation Lungs: clear to auscultation bilaterally Breasts: normal appearance, no masses or tenderness, No nipple retraction or dimpling, No nipple discharge or bleeding, No axillary adenopathy Heart: regular rate and rhythm Abdomen: soft, non-tender; no masses, no organomegaly Extremities: extremities normal, atraumatic, no cyanosis or edema Skin: skin color, texture, turgor normal. Multiple skin tags, nevi.  Lymph nodes: cervical, supraclavicular, and axillary nodes normal. Neurologic: grossly normal  Pelvic: External genitalia:  no lesions              No abnormal inguinal nodes palpated.              Urethra:  normal appearing urethra with no masses, tenderness or lesions              Bartholins and Skenes: normal                 Vagina: normal appearing vagina with normal color and discharge, no lesions              Cervix:  absent              Pap taken: No. Bimanual Exam:  Uterus:  Absent.              Adnexa: no mass, fullness, tenderness              Rectal exam: Yes.  .  Confirms.              Anus:  normal sphincter tone, no lesions  Chaperone was present for  exam.  Assessment:   Well woman visit with  normal exam. Status post TAH.  Status post laparoscopic BSO with robotic approach.  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 Osteopenia.Followed by PCP. Off ERT recently.  Depression and anxiety.  On Cymbalta. Bereavement.  Skin tags and nevi.   Plan: Mammogram screening discussed. Self breast awareness reviewed. Pap and HR HPV as above. Guidelines for Calcium, Vitamin D, regular exercise program including cardiovascular and weight bearing exercise. BMD with PCP.  Support given for loss of husband.  She will follow up with dermatology.  Follow up annually and prn.    After visit summary provided.

## 2019-10-31 ENCOUNTER — Other Ambulatory Visit: Payer: Self-pay

## 2019-10-31 ENCOUNTER — Ambulatory Visit (INDEPENDENT_AMBULATORY_CARE_PROVIDER_SITE_OTHER): Payer: Medicare Other | Admitting: Obstetrics and Gynecology

## 2019-10-31 ENCOUNTER — Encounter: Payer: Self-pay | Admitting: Obstetrics and Gynecology

## 2019-10-31 VITALS — BP 142/82 | HR 84 | Temp 97.5°F | Resp 14 | Ht 61.0 in | Wt 184.6 lb

## 2019-10-31 DIAGNOSIS — Z01419 Encounter for gynecological examination (general) (routine) without abnormal findings: Secondary | ICD-10-CM | POA: Diagnosis not present

## 2019-10-31 NOTE — Patient Instructions (Signed)

## 2019-11-21 ENCOUNTER — Other Ambulatory Visit: Payer: Self-pay | Admitting: Family Medicine

## 2019-11-21 DIAGNOSIS — E2839 Other primary ovarian failure: Secondary | ICD-10-CM

## 2019-11-29 ENCOUNTER — Other Ambulatory Visit: Payer: Self-pay | Admitting: *Deleted

## 2019-11-29 NOTE — Patient Outreach (Signed)
[facility]) Care Management  11/29/2019  Victoria Andrade 11-May-1946 [id number]   Telephone Screen  Referral Date:  11/18/2019 Referral Source:  Insurance Plan Reason for Referral:  Screening Insurance:  [employer]   Outreach Attempt:  Successful telephone outreach to patient for telephone screening.  HIPAA verified with patient.  [facility] services reviewed and discussed.  Patient declining [facility] services at this time.  Agreeable to [facility] pamphlet being mailed to home.  Encouraged patient to contact [facility] in the future if needs arise.  Plan:  RN Health Coach will send Successful Letter with [facility] Pamphlet.  RN Health Coach will close case based on patient declining services at this time.   [place] [phone] [name]

## 2019-12-30 DIAGNOSIS — B029 Zoster without complications: Secondary | ICD-10-CM

## 2019-12-30 HISTORY — DX: Zoster without complications: B02.9

## 2020-01-03 ENCOUNTER — Other Ambulatory Visit: Payer: Self-pay | Admitting: Urology

## 2020-01-04 ENCOUNTER — Other Ambulatory Visit (HOSPITAL_COMMUNITY): Payer: Self-pay | Admitting: Urology

## 2020-01-04 DIAGNOSIS — N200 Calculus of kidney: Secondary | ICD-10-CM

## 2020-01-25 DIAGNOSIS — H53143 Visual discomfort, bilateral: Secondary | ICD-10-CM | POA: Diagnosis not present

## 2020-01-25 DIAGNOSIS — H40013 Open angle with borderline findings, low risk, bilateral: Secondary | ICD-10-CM | POA: Diagnosis not present

## 2020-01-25 DIAGNOSIS — H353132 Nonexudative age-related macular degeneration, bilateral, intermediate dry stage: Secondary | ICD-10-CM | POA: Diagnosis not present

## 2020-01-25 DIAGNOSIS — H35033 Hypertensive retinopathy, bilateral: Secondary | ICD-10-CM | POA: Diagnosis not present

## 2020-02-06 ENCOUNTER — Other Ambulatory Visit (HOSPITAL_COMMUNITY): Payer: Medicare Other

## 2020-02-06 ENCOUNTER — Ambulatory Visit (HOSPITAL_COMMUNITY): Payer: Medicare Other

## 2020-02-07 ENCOUNTER — Other Ambulatory Visit: Payer: Medicare Other

## 2020-03-05 NOTE — Patient Instructions (Signed)
DUE TO COVID-19 ONLY ONE VISITOR IS ALLOWED TO COME WITH YOU AND STAY IN THE WAITING ROOM ONLY DURING PRE OP AND PROCEDURE. THE ONE VISITOR MAY VISIT WITH YOU IN YOUR PRIVATE ROOM DURING VISITING HOURS ONLY!!   COVID SWAB TESTING MUST BE COMPLETED ON: Monday, March 12, 2020  [place], [place] [facility] enter pre surgical testing line (Must self quarantine after testing. Follow instructions on handout.)        [address] [place], [place] - the short stay covered drive at [facility] (Use the [employer] entrance to [facility] next to [facility].) (Must self quarantine after testing. Follow instructions on handout.)  [facility] Entrance [place] (Must self quarantine after testing. Follow instructions on handout.)       Your procedure is scheduled on: Thursday, March 15, 2020   Report to [facility] Main  Entrance    Report to admitting at 8:00 AM   Call this number if you have problems the morning of surgery [phone]   Do not eat food or drink liquids :After Midnight.   Oral Hygiene is also important to reduce your risk of infection.                                    Remember - BRUSH YOUR TEETH THE MORNING OF SURGERY WITH YOUR REGULAR TOOTHPASTE   Do NOT smoke after Midnight   Take these medicines the morning of surgery with A SIP OF WATER: Loratadine, Alprazolam if needed  DO NOT TAKE ANY DIABETIC MEDICATIONS DAY OF YOUR SURGERY                               You may not have any metal on your body including hair pins, jewelry, and body piercings             Do not wear make-up, lotions, powders, perfumes/cologne, or deodorant             Do not wear nail polish.  Do not shave  48 hours prior to surgery.               Do not bring valuables to the hospital. [place].   Contacts, dentures or bridgework may  not be worn into surgery.   Bring small overnight bag day of surgery.    Special Instructions: Bring a copy of your healthcare power of attorney and living will documents         the day of surgery if you haven't scanned them in before.              Please read over the following fact sheets you were given: [facility] - Preparing for Surgery Before surgery, you can play an important role.  Because skin is not sterile, your skin needs to be as free of germs as possible.  You can reduce the number of germs on your skin by washing with CHG (chlorahexidine gluconate) soap before surgery.  CHG is an antiseptic cleaner which kills germs and bonds with the skin to continue killing germs even after washing. Please DO NOT use if you have an allergy to CHG or  antibacterial soaps.  If your skin becomes reddened/irritated stop using the CHG and inform your nurse when you arrive at Short Stay. Do not shave (including legs and underarms) for at least 48 hours prior to the first CHG shower.  You may shave your face/neck.  Please follow these instructions carefully:  1.  Shower with CHG Soap the night before surgery and the  morning of surgery.  2.  If you choose to wash your hair, wash your hair first as usual with your normal  shampoo.  3.  After you shampoo, rinse your hair and body thoroughly to remove the shampoo.                             4.  Use CHG as you would any other liquid soap.  You can apply chg directly to the skin and wash.  Gently with a scrungie or clean washcloth.  5.  Apply the CHG Soap to your body ONLY FROM THE NECK DOWN.   Do   not use on face/ open                           Wound or open sores. Avoid contact with eyes, ears mouth and   genitals (private parts).                       Wash face,  Genitals (private parts) with your normal soap.             6.  Wash thoroughly, paying special attention to the area where your    surgery  will be performed.  7.  Thoroughly rinse your body  with warm water from the neck down.  8.  DO NOT shower/wash with your normal soap after using and rinsing off the CHG Soap.                9.  Pat yourself dry with a clean towel.            10.  Wear clean pajamas.            11.  Place clean sheets on your bed the night of your first shower and do not  sleep with pets. Day of Surgery : Do not apply any lotions/deodorants the morning of surgery.  Please wear clean clothes to the hospital/surgery center.  FAILURE TO FOLLOW THESE INSTRUCTIONS MAY RESULT IN THE CANCELLATION OF YOUR SURGERY  PATIENT SIGNATURE_________________________________  NURSE SIGNATURE__________________________________  ________________________________________________________________________

## 2020-03-06 ENCOUNTER — Encounter (HOSPITAL_COMMUNITY): Payer: Self-pay

## 2020-03-06 ENCOUNTER — Encounter (HOSPITAL_COMMUNITY)
Admission: RE | Admit: 2020-03-06 | Discharge: 2020-03-06 | Disposition: A | Discharge status: Home / Self Care | Payer: Medicare PPO | Source: Ambulatory Visit | Attending: Urology | Admitting: Urology

## 2020-03-06 ENCOUNTER — Other Ambulatory Visit: Payer: Self-pay

## 2020-03-06 HISTORY — DX: Migraine, unspecified, not intractable, without status migrainosus: G43.909

## 2020-03-06 HISTORY — DX: Other abnormalities of gait and mobility: R26.89

## 2020-03-06 HISTORY — DX: Polyneuropathy, unspecified: G62.9

## 2020-03-06 HISTORY — DX: Fatty (change of) liver, not elsewhere classified: K76.0

## 2020-03-06 HISTORY — DX: Personal history of other diseases of the respiratory system: Z87.09

## 2020-03-06 HISTORY — DX: Personal history of urinary calculi: Z87.442

## 2020-03-06 NOTE — Progress Notes (Signed)
PCP - Dr. Eddie Candle Cardiologist - N/A  Chest x-ray - greater than 1 year EKG -  03/09/20 in epic Stress Test - greater than 2 years ECHO - greater than 2 years Cardiac Cath -N/A   Sleep Study - N/A CPAP - N/A  Fasting Blood Sugar - N/A Checks Blood Sugar __N/A___ times a day  Blood Thinner Instructions: N/A  Aspirin Instructions: N/A Last Dose: N/A  Anesthesia review:  N/A  Patient denies shortness of breath, fever, cough and chest pain at PAT appointment   Patient verbalized understanding of instructions that were given to them at the PAT appointment. Patient was also instructed that they will need to review over the PAT instructions again at home before surgery.

## 2020-03-06 NOTE — Patient Instructions (Addendum)
DUE TO COVID-19 ONLY ONE VISITOR IS ALLOWED TO COME WITH YOU AND STAY IN THE WAITING ROOM ONLY DURING PRE OP AND PROCEDURE. THE ONE VISITOR MAY VISIT WITH YOU IN YOUR PRIVATE ROOM DURING VISITING HOURS ONLY!!   COVID SWAB TESTING MUST BE COMPLETED ON:  Monday, March 12, 2020 at 1:00 PM      [place] Entrance [place]. (Must self quarantine after testing. Follow instructions on handout.)       Your procedure is scheduled on: Thursday, March 15, 2020   Report to [facility] Main  Entrance    Report to admitting at 7:45 AM   Call this number if you have problems the morning of surgery [phone]   Do not eat food or drink liquids :After Midnight.   Oral Hygiene is also important to reduce your risk of infection.                                    Remember - BRUSH YOUR TEETH THE MORNING OF SURGERY WITH YOUR REGULAR TOOTHPASTE   Do NOT smoke after Midnight   Take these medicines the morning of surgery with A SIP OF WATER: Loratadine, Alprazolam if needed                               You may not have any metal on your body including hair pins, jewelry, and body piercings             Do not wear make-up, lotions, powders, perfumes/cologne, or deodorant             Do not wear nail polish.  Do not shave  48 hours prior to surgery.                Do not bring valuables to the hospital. [place].   Contacts, dentures or bridgework may not be worn into surgery.   Bring small overnight bag day of surgery.    Special Instructions: Bring a copy of your healthcare power of attorney and living will documents         the day of surgery if you haven't scanned them in before.              Please read over the following fact sheets you were given: Use Dial antibacterial soap, the night before surgery, dry off with a clean fresh towel, wear clean fresh pajamas to bed and sleep on clean fresh  sheets without any pets in the bed the night before surgery.  The morning of surgery repeat a shower with Dial antibacterial soap wear clean fresh clothes to hospital the morning of surgery.

## 2020-03-09 ENCOUNTER — Other Ambulatory Visit: Payer: Self-pay

## 2020-03-09 ENCOUNTER — Encounter (HOSPITAL_COMMUNITY)
Admission: RE | Admit: 2020-03-09 | Discharge: 2020-03-09 | Disposition: A | Discharge status: Home / Self Care | Payer: Medicare PPO | Source: Ambulatory Visit | Attending: Urology | Admitting: Urology

## 2020-03-09 DIAGNOSIS — Z01818 Encounter for other preprocedural examination: Secondary | ICD-10-CM | POA: Diagnosis not present

## 2020-03-09 DIAGNOSIS — R7303 Prediabetes: Secondary | ICD-10-CM | POA: Diagnosis not present

## 2020-03-09 LAB — CBC
HCT: 41.7 % (ref 36.0–46.0)
Hemoglobin: 13.5 g/dL (ref 12.0–15.0)
MCH: 30.1 pg (ref 26.0–34.0)
MCHC: 32.4 g/dL (ref 30.0–36.0)
MCV: 93.1 fL (ref 80.0–100.0)
Platelets: 285 10*3/uL (ref 150–400)
RBC: 4.48 MIL/uL (ref 3.87–5.11)
RDW: 12.6 % (ref 11.5–15.5)
WBC: 7.6 10*3/uL (ref 4.0–10.5)
nRBC: 0 % (ref 0.0–0.2)

## 2020-03-09 LAB — BASIC METABOLIC PANEL
Anion gap: 11 (ref 5–15)
BUN: 20 mg/dL (ref 8–23)
CO2: 27 mmol/L (ref 22–32)
Calcium: 9.2 mg/dL (ref 8.9–10.3)
Chloride: 101 mmol/L (ref 98–111)
Creatinine, Ser: 0.85 mg/dL (ref 0.44–1.00)
GFR calc Af Amer: >60 mL/min (ref >60)
GFR calc non Af Amer: >60 mL/min (ref >60)
Glucose, Bld: 262 mg/dL — ABNORMAL HIGH (ref 70–99)
Potassium: 3.7 mmol/L (ref 3.5–5.1)
Sodium: 139 mmol/L (ref 135–145)

## 2020-03-09 LAB — HEMOGLOBIN A1C
Hgb A1c MFr Bld: 8.4 % — ABNORMAL HIGH (ref 4.8–5.6)
Mean Plasma Glucose: 194.38 mg/dL

## 2020-03-09 NOTE — Progress Notes (Signed)
PCP - Dr. Eddie Candle Cardiologist - N/A  Chest x-ray - greater than 1 year EKG -  03/09/20 in epic Stress Test - greater than 2 years ECHO - greater than 2 years Cardiac Cath -N/A   Sleep Study - N/A CPAP - N/A  Fasting Blood Sugar - N/A Checks Blood Sugar __N/A___ times a day  Blood Thinner Instructions: N/A  Aspirin Instructions: N/A Last Dose: N/A  Anesthesia review:  BMP Glucose 262, Hgb A1c 8.4 03/09/20 pre diabetic  Patient denies shortness of breath, fever, cough and chest pain at PAT appointment   Patient verbalized understanding of instructions that were given to them at the PAT appointment. Patient was also instructed that they will need to review over the PAT instructions again at home before surgery.

## 2020-03-12 ENCOUNTER — Other Ambulatory Visit: Payer: Self-pay

## 2020-03-12 ENCOUNTER — Other Ambulatory Visit
Admission: RE | Admit: 2020-03-12 | Discharge: 2020-03-12 | Disposition: A | Discharge status: Home / Self Care | Payer: Medicare PPO | Source: Ambulatory Visit | Attending: Urology | Admitting: Urology

## 2020-03-12 DIAGNOSIS — Z20822 Contact with and (suspected) exposure to covid-19: Secondary | ICD-10-CM | POA: Insufficient documentation

## 2020-03-12 DIAGNOSIS — Z01812 Encounter for preprocedural laboratory examination: Secondary | ICD-10-CM | POA: Insufficient documentation

## 2020-03-12 NOTE — Progress Notes (Signed)
Anesthesia Chart Review  Pt with h/o pre-diabetes per chart review.  Blood glucose 262 and A1C 8.4 at PAT visit.  Currently on no diabetes medications.  Contacted PCPs office, Dr. Sharilyn Sites.  Pt last seen 10/2019.  Nurse reports she will discuss with Dr. Hilma Favors and schedule pt to be evaluated prior to upcoming procedure if possible.  VM left with Dr. Lynne Logan office regarding untreated diabetes and discussion with PCP.  Maia Plan [facility] Pre-Surgical Testing [phone] 03/12/20  11:44 AM

## 2020-03-13 ENCOUNTER — Other Ambulatory Visit: Payer: Self-pay | Admitting: Radiology

## 2020-03-13 LAB — SARS CORONAVIRUS 2 (TAT 6-24 HRS): SARS Coronavirus 2: NEGATIVE

## 2020-03-14 DIAGNOSIS — Z1389 Encounter for screening for other disorder: Secondary | ICD-10-CM | POA: Diagnosis not present

## 2020-03-14 DIAGNOSIS — Z0001 Encounter for general adult medical examination with abnormal findings: Secondary | ICD-10-CM | POA: Diagnosis not present

## 2020-03-14 DIAGNOSIS — K589 Irritable bowel syndrome without diarrhea: Secondary | ICD-10-CM | POA: Diagnosis not present

## 2020-03-14 DIAGNOSIS — N200 Calculus of kidney: Secondary | ICD-10-CM | POA: Diagnosis not present

## 2020-03-14 DIAGNOSIS — E119 Type 2 diabetes mellitus without complications: Secondary | ICD-10-CM | POA: Diagnosis not present

## 2020-03-14 DIAGNOSIS — E1165 Type 2 diabetes mellitus with hyperglycemia: Secondary | ICD-10-CM | POA: Diagnosis not present

## 2020-03-14 DIAGNOSIS — E6609 Other obesity due to excess calories: Secondary | ICD-10-CM | POA: Diagnosis not present

## 2020-03-14 DIAGNOSIS — Z6834 Body mass index (BMI) 34.0-34.9, adult: Secondary | ICD-10-CM | POA: Diagnosis not present

## 2020-03-14 NOTE — H&P (Signed)
CC/HPI: Bilateral renal calculi   Victoria Andrade follows up routine evaluation of her bilateral renal calculi. Unfortunately, she has had a very difficult year. Her husband died earlier this year. She has not had significant flank pain but has continued to have her typical, intermittent right-sided abdominal and flank pain. Her chronic pain symptoms have been thought to be related to a GI etiology previously. She believes she might have passed a stone fragment recently. She has not noted any gross hematuria. She does not have any nausea or other concerning symptoms today. Her CT scan confirmed an enlarging right renal pelvic stone with some evidence of dilation of her right renal collecting system.  During her preoperative evaluation, she was noted to have an elevated hemoglobin A1c and serum blood  Glucose.  She was evaluated by her primary care physician preoperatively.    ALLERGIES: Adhesive tape Ciprofloxacin HCl TABS - Skin Rash Keflex TABS - Skin Rash Latex - Swelling, Trouble Breathing Penicillins - Skin Rash PredniSONE TABS Sulfa Drugs - Skin Rash Tamsulosin HCl CAPS - Other Reaction, hair loss    MEDICATIONS: Amerge 2.5 mg tablet Oral  Benadryl TABS Oral  Claritin TABS Oral  Duloxetine Hcl  Hydrochlorothiazide 25 mg tablet 1 Oral  Linzess 145 mcg capsule 1 capsule PO Daily  Miralax  Montelukast Sodium 10 mg tablet Oral  Stool Softener  Vitamin D-3 5000 UNIT TABS Oral  Xanax 1 mg tablet Oral     GU PSH: ESWL - 2017, 2016, 2013, 2013 Hysterectomy Unilat SO - 2013     NON-GU PSH: Cataract surgery, Bilateral Cataract Surgery.., Bilateral Cholecystectomy (laparoscopic) Remove Tonsils - 2008 Shoulder Joint Surgery, Left - 2018 Tubal Ligation - 2008     GU PMH: Flank Pain (Worsening, Chronic), Right, Culture urine. No ABX unless culture proven UTI. Reassured no acute finding today today explain flank pain. Recommend she alternate Tylenol/NSAID and heat/ice for pain. If  pain persist or worsens may need CT urogram. - 2019 Renal calculus, Nephrolithiasis - 2017 Unspecified condition associated with female genital organs and menstrual cycle, Adnexal mass - 2016 Other microscopic hematuria, Microscopic hematuria - 2015 Ureteral calculus, Calculus of ureter - 2014      PMH Notes:   1) Urolithiasis: She has a history of calcium oxalate urolithiasis. She has a history of low urine volume and hyperoxaluria.   Current treatment: Fluid hydration, calcium citrate, low oxalate diet, high citrate diet  Prior treatment: HCTZ (unable to tolerate)   1995: ESWL  Apr 2013: ESWL 8 mm left ureteral stone  June 2013: ESWL of multiple left renal calculi  Aug 2013: 24 hr urine - low urine volume, hyperoxaluria  Feb 2014: 24 hr urine - Stone risk significantly reduced  Apr 2015: 24 hr urine - Calcium and oxalate increased  Dec 2015: 24 hr urine - Hypercalciuria and high urine sodium - increased HCTZ to 25 mg q am and 12.5 mg q pm, reduce dietary sodium  Jul 2016: 24 hr urine - Hyeroxaluria and hypocitraturia - not compliant with calcium treatment -- plan to increase calcium supplement compliance  Dec 2016: ESWL of right renal calculus  Mar 2017: ESWL of left renal calculus     NON-GU PMH: Anxiety, Anxiety - 2014 Irritable bowel syndrome with diarrhea, Irritable Bowel Syndrome - 2014 GERD Hypertension    FAMILY HISTORY: Acute Myocardial Infarction - Mother, Runs In Family Chronic Obstructive Pulmonary Disease - Mother Diabetes - Runs In Family Ischemic Stroke - Mother   SOCIAL HISTORY:  Marital Status: Married Preferred Language: English; Ethnicity: Not Hispanic Or Latino; Race: White Current Smoking Status: Patient has never smoked.  Does not use smokeless tobacco. Types of alcohol consumed: Wine. Social Drinker.  Does not use drugs. Does not drink caffeine. Has not had a blood transfusion.    REVIEW OF SYSTEMS:    GU Review Female:   Patient reports hard to  postpone urination, burning /pain with urination, and have to strain to urinate. Patient denies frequent urination, get up at night to urinate, leakage of urine, stream starts and stops, trouble starting your stream, and currently pregnant.  Gastrointestinal (Upper):   Patient denies nausea and vomiting.  Gastrointestinal (Lower):   Patient denies diarrhea and constipation.  Constitutional:   Patient denies fever, night sweats, weight loss, and fatigue.  Skin:   Patient denies skin rash/ lesion and itching.  Eyes:   Patient denies blurred vision and double vision.  Ears/ Nose/ Throat:   Patient denies sore throat and sinus problems.  Hematologic/Lymphatic:   Patient denies swollen glands and easy bruising.  Cardiovascular:   Patient denies leg swelling and chest pains.  Respiratory:   Patient denies cough and shortness of breath.  Endocrine:   Patient denies excessive thirst.  Musculoskeletal:   Patient denies back pain and joint pain.  Neurological:   Patient denies dizziness and headaches.  Psychologic:   Patient denies depression and anxiety.    Weight 183 lb / 83.01 kg  Height 62 in / 157.48 cm  BMI 33.5 kg/m   MULTI-SYSTEM PHYSICAL EXAMINATION:    Constitutional: Well-nourished. No physical deformities. Normally developed. Good grooming.  Neck: Neck symmetrical, not swollen. Normal tracheal position.  Respiratory: No labored breathing, no use of accessory muscles. Clear bilaterally.  Cardiovascular: Normal temperature, normal extremity pulses, no swelling, no varicosities. Regular rate and rhythm.  Lymphatic: No enlargement of neck, axillae, groin.  Skin: No paleness, no jaundice, no cyanosis. No lesion, no ulcer, no rash.  Neurologic / Psychiatric: Oriented to time, oriented to place, oriented to person. No depression, no anxiety, no agitation.  Gastrointestinal: No mass, no tenderness, no rigidity, non obese abdomen. No CVA tenderness.  Eyes: Normal conjunctivae. Normal eyelids.   Ears, Nose, Mouth, and Throat: Left ear no scars, no lesions, no masses. Right ear no scars, no lesions, no masses. Nose no scars, no lesions, no masses. Normal hearing. Normal lips.  Musculoskeletal: Normal gait and station of head and neck.       ASSESSMENT:      ICD-10 Details  1 GU:   Renal calculus - N20.0    PLAN:       1. Bilateral renal calculi: I have recommended that she proceed with a CT stone protocol scan for further evaluation to confirm this finding and to evaluate her anatomy for possible surgical treatment. We did discuss the fact that her stone is large enough that she would benefit most likely from a right percutaneous nephrolithotomy procedure. We reviewed that procedure in detail today including the potential risks, complications, and the expected recovery process as well as the need for potentially staged procedures. Her renal function will be checked today.

## 2020-03-15 ENCOUNTER — Ambulatory Visit (HOSPITAL_COMMUNITY): Payer: Medicare PPO | Admitting: Certified Registered Nurse Anesthetist

## 2020-03-15 ENCOUNTER — Ambulatory Visit (HOSPITAL_COMMUNITY)
Admission: RE | Admit: 2020-03-15 | Discharge: 2020-03-15 | Disposition: A | Discharge status: Home / Self Care | Payer: Medicare PPO | Source: Ambulatory Visit | Attending: Urology | Admitting: Urology

## 2020-03-15 ENCOUNTER — Encounter (HOSPITAL_COMMUNITY): Payer: Self-pay | Admitting: Urology

## 2020-03-15 ENCOUNTER — Other Ambulatory Visit: Payer: Self-pay

## 2020-03-15 ENCOUNTER — Encounter (HOSPITAL_COMMUNITY)
Admission: AD | Disposition: A | Discharge status: Home Health | Payer: Self-pay | Source: Home / Self Care | Attending: Urology

## 2020-03-15 ENCOUNTER — Inpatient Hospital Stay (HOSPITAL_COMMUNITY)
Admission: AD | Admit: 2020-03-15 | Discharge: 2020-03-17 | DRG: 661 | Disposition: A | Discharge status: Home Health | Payer: Medicare PPO | Attending: Urology | Admitting: Urology

## 2020-03-15 ENCOUNTER — Ambulatory Visit (HOSPITAL_COMMUNITY): Payer: Medicare PPO

## 2020-03-15 ENCOUNTER — Ambulatory Visit (HOSPITAL_COMMUNITY): Payer: Medicare PPO | Admitting: Physician Assistant

## 2020-03-15 DIAGNOSIS — Z9842 Cataract extraction status, left eye: Secondary | ICD-10-CM

## 2020-03-15 DIAGNOSIS — Z825 Family history of asthma and other chronic lower respiratory diseases: Secondary | ICD-10-CM

## 2020-03-15 DIAGNOSIS — M8580 Other specified disorders of bone density and structure, unspecified site: Secondary | ICD-10-CM | POA: Diagnosis present

## 2020-03-15 DIAGNOSIS — I10 Essential (primary) hypertension: Secondary | ICD-10-CM | POA: Diagnosis present

## 2020-03-15 DIAGNOSIS — N200 Calculus of kidney: Secondary | ICD-10-CM

## 2020-03-15 DIAGNOSIS — Z9049 Acquired absence of other specified parts of digestive tract: Secondary | ICD-10-CM | POA: Diagnosis not present

## 2020-03-15 DIAGNOSIS — Z8349 Family history of other endocrine, nutritional and metabolic diseases: Secondary | ICD-10-CM | POA: Diagnosis not present

## 2020-03-15 DIAGNOSIS — N132 Hydronephrosis with renal and ureteral calculous obstruction: Secondary | ICD-10-CM | POA: Diagnosis present

## 2020-03-15 DIAGNOSIS — N201 Calculus of ureter: Secondary | ICD-10-CM | POA: Diagnosis not present

## 2020-03-15 DIAGNOSIS — Z801 Family history of malignant neoplasm of trachea, bronchus and lung: Secondary | ICD-10-CM

## 2020-03-15 DIAGNOSIS — Z8249 Family history of ischemic heart disease and other diseases of the circulatory system: Secondary | ICD-10-CM | POA: Diagnosis not present

## 2020-03-15 DIAGNOSIS — Z90710 Acquired absence of both cervix and uterus: Secondary | ICD-10-CM | POA: Diagnosis not present

## 2020-03-15 DIAGNOSIS — Z20822 Contact with and (suspected) exposure to covid-19: Secondary | ICD-10-CM | POA: Diagnosis present

## 2020-03-15 DIAGNOSIS — Z9841 Cataract extraction status, right eye: Secondary | ICD-10-CM | POA: Diagnosis not present

## 2020-03-15 DIAGNOSIS — Z833 Family history of diabetes mellitus: Secondary | ICD-10-CM | POA: Diagnosis not present

## 2020-03-15 DIAGNOSIS — F419 Anxiety disorder, unspecified: Secondary | ICD-10-CM | POA: Diagnosis not present

## 2020-03-15 DIAGNOSIS — Z85820 Personal history of malignant melanoma of skin: Secondary | ICD-10-CM

## 2020-03-15 DIAGNOSIS — Z823 Family history of stroke: Secondary | ICD-10-CM | POA: Diagnosis not present

## 2020-03-15 DIAGNOSIS — Z87442 Personal history of urinary calculi: Secondary | ICD-10-CM

## 2020-03-15 DIAGNOSIS — K219 Gastro-esophageal reflux disease without esophagitis: Secondary | ICD-10-CM | POA: Diagnosis not present

## 2020-03-15 HISTORY — PX: NEPHROLITHOTOMY: SHX5134

## 2020-03-15 HISTORY — DX: Type 2 diabetes mellitus without complications: E11.9

## 2020-03-15 HISTORY — PX: IR URETERAL STENT RIGHT NEW ACCESS W/O SEP NEPHROSTOMY CATH: IMG6076

## 2020-03-15 LAB — BASIC METABOLIC PANEL
Anion gap: 12 (ref 5–15)
Anion gap: 11 (ref 5–15)
BUN: 22 mg/dL (ref 8–23)
BUN: 19 mg/dL (ref 8–23)
CO2: 25 mmol/L (ref 22–32)
CO2: 23 mmol/L (ref 22–32)
Calcium: 9.9 mg/dL (ref 8.9–10.3)
Calcium: 8.7 mg/dL — ABNORMAL LOW (ref 8.9–10.3)
Chloride: 103 mmol/L (ref 98–111)
Chloride: 104 mmol/L (ref 98–111)
Creatinine, Ser: 0.98 mg/dL (ref 0.44–1.00)
Creatinine, Ser: 0.86 mg/dL (ref 0.44–1.00)
GFR calc Af Amer: >60 mL/min (ref >60)
GFR calc Af Amer: >60 mL/min (ref >60)
GFR calc non Af Amer: 58 mL/min — ABNORMAL LOW (ref >60)
GFR calc non Af Amer: >60 mL/min (ref >60)
Glucose, Bld: 177 mg/dL — ABNORMAL HIGH (ref 70–99)
Glucose, Bld: 195 mg/dL — ABNORMAL HIGH (ref 70–99)
Potassium: 3.5 mmol/L (ref 3.5–5.1)
Potassium: 3.4 mmol/L — ABNORMAL LOW (ref 3.5–5.1)
Sodium: 140 mmol/L (ref 135–145)
Sodium: 138 mmol/L (ref 135–145)

## 2020-03-15 LAB — CBC WITH DIFFERENTIAL/PLATELET
Abs Immature Granulocytes: 0.03 10*3/uL (ref 0.00–0.07)
Basophils Absolute: 0.1 10*3/uL (ref 0.0–0.1)
Basophils Relative: 1 %
Eosinophils Absolute: 0.1 10*3/uL (ref 0.0–0.5)
Eosinophils Relative: 1 %
HCT: 41.3 % (ref 36.0–46.0)
Hemoglobin: 13.8 g/dL (ref 12.0–15.0)
Immature Granulocytes: 0 %
Lymphocytes Relative: 23 %
Lymphs Abs: 2.2 10*3/uL (ref 0.7–4.0)
MCH: 30.6 pg (ref 26.0–34.0)
MCHC: 33.4 g/dL (ref 30.0–36.0)
MCV: 91.6 fL (ref 80.0–100.0)
Monocytes Absolute: 0.8 10*3/uL (ref 0.1–1.0)
Monocytes Relative: 8 %
Neutro Abs: 6.5 10*3/uL (ref 1.7–7.7)
Neutrophils Relative %: 67 %
Platelets: 277 10*3/uL (ref 150–400)
RBC: 4.51 MIL/uL (ref 3.87–5.11)
RDW: 12.6 % (ref 11.5–15.5)
WBC: 9.6 10*3/uL (ref 4.0–10.5)
nRBC: 0 % (ref 0.0–0.2)

## 2020-03-15 LAB — GLUCOSE, CAPILLARY
Glucose-Capillary: 148 mg/dL — ABNORMAL HIGH (ref 70–99)
Glucose-Capillary: 168 mg/dL — ABNORMAL HIGH (ref 70–99)
Glucose-Capillary: 238 mg/dL — ABNORMAL HIGH (ref 70–99)
Glucose-Capillary: 259 mg/dL — ABNORMAL HIGH (ref 70–99)

## 2020-03-15 LAB — TYPE AND SCREEN
ABO/RH(D): A POS
Antibody Screen: NEGATIVE

## 2020-03-15 LAB — HEMOGLOBIN AND HEMATOCRIT, BLOOD
HCT: 35.4 % — ABNORMAL LOW (ref 36.0–46.0)
Hemoglobin: 11.3 g/dL — ABNORMAL LOW (ref 12.0–15.0)

## 2020-03-15 LAB — PROTIME-INR
INR: 1 (ref 0.8–1.2)
Prothrombin Time: 13.2 seconds (ref 11.4–15.2)

## 2020-03-15 SURGERY — NEPHROLITHOTOMY PERCUTANEOUS
Anesthesia: General | Laterality: Right

## 2020-03-15 MED ORDER — ALPRAZOLAM 1 MG PO TABS
1.5000 mg | ORAL_TABLET | Freq: Every day | ORAL | Status: DC
  Administered 2020-03-15 – 2020-03-16 (×2): 1.5 mg via ORAL
  Filled 2020-03-15 (×2): qty 1

## 2020-03-15 MED ORDER — FENTANYL CITRATE (PF) 100 MCG/2ML IJ SOLN
INTRAMUSCULAR | Status: AC
  Filled 2020-03-15: qty 2

## 2020-03-15 MED ORDER — ACETAMINOPHEN 325 MG PO TABS
650.0000 mg | ORAL_TABLET | ORAL | Status: DC | PRN
  Administered 2020-03-16: 650 mg via ORAL
  Filled 2020-03-15: qty 2

## 2020-03-15 MED ORDER — MIDAZOLAM HCL 2 MG/2ML IJ SOLN
INTRAMUSCULAR | Status: AC
  Filled 2020-03-15: qty 4

## 2020-03-15 MED ORDER — SUGAMMADEX SODIUM 200 MG/2ML IV SOLN
INTRAVENOUS | Status: DC | PRN
  Administered 2020-03-15: 200 mg via INTRAVENOUS

## 2020-03-15 MED ORDER — ZOLPIDEM TARTRATE 5 MG PO TABS: 5.0000 mg | ORAL_TABLET | Freq: Every evening | ORAL | Status: DC | PRN

## 2020-03-15 MED ORDER — DOCUSATE SODIUM 100 MG PO CAPS
100.0000 mg | ORAL_CAPSULE | Freq: Two times a day (BID) | ORAL | Status: DC
  Administered 2020-03-15 – 2020-03-17 (×4): 100 mg via ORAL
  Filled 2020-03-15 (×4): qty 1

## 2020-03-15 MED ORDER — PHENYLEPHRINE HCL (PRESSORS) 10 MG/ML IV SOLN
INTRAVENOUS | Status: AC
  Filled 2020-03-15: qty 1

## 2020-03-15 MED ORDER — HYDROMORPHONE HCL 1 MG/ML IJ SOLN: 0.2500 mg | INTRAMUSCULAR | Status: DC | PRN

## 2020-03-15 MED ORDER — HYDROCHLOROTHIAZIDE 25 MG PO TABS: 25.0000 mg | ORAL_TABLET | Freq: Every day | ORAL | Status: DC | PRN

## 2020-03-15 MED ORDER — ORAL CARE MOUTH RINSE: 15.0000 mL | Freq: Two times a day (BID) | OROMUCOSAL | Status: DC

## 2020-03-15 MED ORDER — INSULIN ASPART 100 UNIT/ML ~~LOC~~ SOLN
0.0000 [IU] | SUBCUTANEOUS | Status: DC
  Administered 2020-03-15: 8 [IU] via SUBCUTANEOUS
  Administered 2020-03-15: 5 [IU] via SUBCUTANEOUS
  Administered 2020-03-16: 8 [IU] via SUBCUTANEOUS
  Administered 2020-03-16: 5 [IU] via SUBCUTANEOUS
  Administered 2020-03-16 (×2): 3 [IU] via SUBCUTANEOUS
  Administered 2020-03-16: 5 [IU] via SUBCUTANEOUS
  Administered 2020-03-16 – 2020-03-17 (×5): 3 [IU] via SUBCUTANEOUS

## 2020-03-15 MED ORDER — ALPRAZOLAM 0.5 MG PO TABS: 0.5000 mg | ORAL_TABLET | Freq: Every evening | ORAL | Status: DC | PRN

## 2020-03-15 MED ORDER — EPHEDRINE SULFATE-NACL 50-0.9 MG/10ML-% IV SOSY
PREFILLED_SYRINGE | INTRAVENOUS | Status: DC | PRN
  Administered 2020-03-15 (×2): 5 mg via INTRAVENOUS
  Administered 2020-03-15: 10 mg via INTRAVENOUS
  Administered 2020-03-15: 5 mg via INTRAVENOUS
  Administered 2020-03-15 (×2): 10 mg via INTRAVENOUS
  Administered 2020-03-15: 5 mg via INTRAVENOUS

## 2020-03-15 MED ORDER — LIDOCAINE HCL 1 % IJ SOLN
INTRAMUSCULAR | Status: AC
  Filled 2020-03-15: qty 20

## 2020-03-15 MED ORDER — FENTANYL CITRATE (PF) 100 MCG/2ML IJ SOLN
INTRAMUSCULAR | Status: DC | PRN
  Administered 2020-03-15 (×4): 50 ug via INTRAVENOUS

## 2020-03-15 MED ORDER — EPHEDRINE 5 MG/ML INJ
INTRAVENOUS | Status: AC
  Filled 2020-03-15: qty 10

## 2020-03-15 MED ORDER — ROCURONIUM BROMIDE 50 MG/5ML IV SOSY
PREFILLED_SYRINGE | INTRAVENOUS | Status: DC | PRN
  Administered 2020-03-15: 50 mg via INTRAVENOUS

## 2020-03-15 MED ORDER — LIDOCAINE HCL (PF) 1 % IJ SOLN
INTRAMUSCULAR | Status: AC | PRN
  Administered 2020-03-15: 10 mL

## 2020-03-15 MED ORDER — LORATADINE 10 MG PO TABS
10.0000 mg | ORAL_TABLET | Freq: Every day | ORAL | Status: DC
  Administered 2020-03-16 – 2020-03-17 (×2): 10 mg via ORAL
  Filled 2020-03-15 (×2): qty 1

## 2020-03-15 MED ORDER — FAMOTIDINE 20 MG PO TABS: 20.0000 mg | ORAL_TABLET | Freq: Every day | ORAL | Status: DC | PRN

## 2020-03-15 MED ORDER — DIPHENHYDRAMINE HCL 12.5 MG/5ML PO ELIX
12.5000 mg | ORAL_SOLUTION | Freq: Four times a day (QID) | ORAL | Status: DC | PRN
  Administered 2020-03-16 (×2): 12.5 mg via ORAL
  Filled 2020-03-15 (×2): qty 5

## 2020-03-15 MED ORDER — IOHEXOL 300 MG/ML  SOLN
50.0000 mL | Freq: Once | INTRAMUSCULAR | Status: AC | PRN
  Administered 2020-03-15: 5 mL

## 2020-03-15 MED ORDER — LIDOCAINE 2% (20 MG/ML) 5 ML SYRINGE
INTRAMUSCULAR | Status: AC
  Filled 2020-03-15: qty 5

## 2020-03-15 MED ORDER — MONTELUKAST SODIUM 10 MG PO TABS
10.0000 mg | ORAL_TABLET | Freq: Every day | ORAL | Status: DC
  Administered 2020-03-15 – 2020-03-16 (×2): 10 mg via ORAL
  Filled 2020-03-15 (×2): qty 1

## 2020-03-15 MED ORDER — MIDAZOLAM HCL 2 MG/2ML IJ SOLN
INTRAMUSCULAR | Status: AC | PRN
  Administered 2020-03-15 (×2): 1 mg via INTRAVENOUS

## 2020-03-15 MED ORDER — SODIUM CHLORIDE 0.9 % IV SOLN
2.0000 g | INTRAVENOUS | Status: DC
  Filled 2020-03-15: qty 20

## 2020-03-15 MED ORDER — GENTAMICIN SULFATE 40 MG/ML IJ SOLN: 5.0000 mg/kg | Freq: Once | INTRAVENOUS | Status: DC

## 2020-03-15 MED ORDER — LACTATED RINGERS IV SOLN: INTRAVENOUS | Status: DC

## 2020-03-15 MED ORDER — VANCOMYCIN HCL IN DEXTROSE 1-5 GM/200ML-% IV SOLN
INTRAVENOUS | Status: AC
  Administered 2020-03-15: 1000 mg
  Filled 2020-03-15: qty 200

## 2020-03-15 MED ORDER — PROMETHAZINE HCL 25 MG/ML IJ SOLN: 6.2500 mg | INTRAMUSCULAR | Status: DC | PRN

## 2020-03-15 MED ORDER — ROCURONIUM BROMIDE 10 MG/ML (PF) SYRINGE
PREFILLED_SYRINGE | INTRAVENOUS | Status: AC
  Filled 2020-03-15: qty 10

## 2020-03-15 MED ORDER — PROPOFOL 10 MG/ML IV BOLUS
INTRAVENOUS | Status: DC | PRN
  Administered 2020-03-15: 140 mg via INTRAVENOUS

## 2020-03-15 MED ORDER — DEXTROSE-NACL 5-0.45 % IV SOLN: INTRAVENOUS | Status: DC

## 2020-03-15 MED ORDER — ONDANSETRON HCL 4 MG/2ML IJ SOLN
4.0000 mg | INTRAMUSCULAR | Status: DC | PRN
  Administered 2020-03-16: 4 mg via INTRAVENOUS
  Filled 2020-03-15: qty 2

## 2020-03-15 MED ORDER — MEPERIDINE HCL 50 MG/ML IJ SOLN: 6.2500 mg | INTRAMUSCULAR | Status: DC | PRN

## 2020-03-15 MED ORDER — LINACLOTIDE 145 MCG PO CAPS
290.0000 ug | ORAL_CAPSULE | Freq: Every day | ORAL | Status: DC
  Filled 2020-03-15 (×2): qty 2

## 2020-03-15 MED ORDER — GENTAMICIN SULFATE 40 MG/ML IJ SOLN
5.0000 mg/kg | INTRAVENOUS | Status: AC
  Administered 2020-03-15: 11:00:00 320 mg via INTRAVENOUS
  Filled 2020-03-15: qty 8

## 2020-03-15 MED ORDER — DEXAMETHASONE SODIUM PHOSPHATE 10 MG/ML IJ SOLN
INTRAMUSCULAR | Status: AC
  Filled 2020-03-15: qty 1

## 2020-03-15 MED ORDER — ONDANSETRON HCL 4 MG/2ML IJ SOLN
INTRAMUSCULAR | Status: DC | PRN
  Administered 2020-03-15: 4 mg via INTRAVENOUS

## 2020-03-15 MED ORDER — HYDROCHLOROTHIAZIDE 25 MG PO TABS
25.0000 mg | ORAL_TABLET | Freq: Every day | ORAL | Status: DC
  Administered 2020-03-16 – 2020-03-17 (×2): 25 mg via ORAL
  Filled 2020-03-15 (×2): qty 1

## 2020-03-15 MED ORDER — FENTANYL CITRATE (PF) 100 MCG/2ML IJ SOLN
INTRAMUSCULAR | Status: AC | PRN
  Administered 2020-03-15 (×2): 50 ug via INTRAVENOUS

## 2020-03-15 MED ORDER — ONDANSETRON HCL 4 MG/2ML IJ SOLN
INTRAMUSCULAR | Status: AC
  Filled 2020-03-15: qty 2

## 2020-03-15 MED ORDER — VANCOMYCIN HCL IN DEXTROSE 1-5 GM/200ML-% IV SOLN: 1000.0000 mg | INTRAVENOUS | Status: DC

## 2020-03-15 MED ORDER — HYDROMORPHONE HCL 1 MG/ML IJ SOLN
0.5000 mg | INTRAMUSCULAR | Status: DC | PRN
  Administered 2020-03-15: 1 mg via INTRAVENOUS
  Administered 2020-03-15: 0.5 mg via INTRAVENOUS
  Administered 2020-03-16: 1 mg via INTRAVENOUS
  Filled 2020-03-15 (×3): qty 1

## 2020-03-15 MED ORDER — SODIUM CHLORIDE 0.9% FLUSH
5.0000 mL | Freq: Three times a day (TID) | INTRAVENOUS | Status: DC
  Administered 2020-03-15 – 2020-03-17 (×5): 5 mL

## 2020-03-15 MED ORDER — DULOXETINE HCL 60 MG PO CPEP
60.0000 mg | ORAL_CAPSULE | Freq: Every day | ORAL | Status: DC
  Administered 2020-03-15 – 2020-03-16 (×2): 60 mg via ORAL
  Filled 2020-03-15 (×2): qty 1

## 2020-03-15 MED ORDER — LIDOCAINE 2% (20 MG/ML) 5 ML SYRINGE
INTRAMUSCULAR | Status: DC | PRN
  Administered 2020-03-15: 100 mg via INTRAVENOUS

## 2020-03-15 MED ORDER — PROPOFOL 10 MG/ML IV BOLUS
INTRAVENOUS | Status: AC
  Filled 2020-03-15: qty 20

## 2020-03-15 MED ORDER — IOHEXOL 300 MG/ML  SOLN
INTRAMUSCULAR | Status: DC | PRN
  Administered 2020-03-15: 100 mL

## 2020-03-15 MED ORDER — DIPHENHYDRAMINE HCL 50 MG/ML IJ SOLN: 12.5000 mg | Freq: Four times a day (QID) | INTRAMUSCULAR | Status: DC | PRN

## 2020-03-15 MED ORDER — SODIUM CHLORIDE 0.9 % IR SOLN
Status: DC | PRN
  Administered 2020-03-15: 15000 mL

## 2020-03-15 SURGICAL SUPPLY — 57 items
APL PRP STRL LF DISP 70% ISPRP (MISCELLANEOUS)
APL SKNCLS STERI-STRIP NONHPOA (GAUZE/BANDAGES/DRESSINGS) ×1
BAG DRN RND TRDRP ANRFLXCHMBR (UROLOGICAL SUPPLIES) ×1
BAG URINE DRAIN 2000ML AR STRL (UROLOGICAL SUPPLIES) ×3 IMPLANT
BASKET LASER NITINOL 1.9FR (BASKET) ×3 IMPLANT
BASKET STONE NITINOL 3FRX115MB (UROLOGICAL SUPPLIES) IMPLANT
BASKET ZERO TIP NITINOL 2.4FR (BASKET) ×2 IMPLANT
BENZOIN TINCTURE PRP APPL 2/3 (GAUZE/BANDAGES/DRESSINGS) ×3 IMPLANT
BLADE SURG 15 STRL LF DISP TIS (BLADE) ×1 IMPLANT
BLADE SURG 15 STRL SS (BLADE) ×3
BOOTIES KNEE HIGH SLOAN (MISCELLANEOUS) ×5 IMPLANT
BSKT STON RTRVL 120 1.9FR (BASKET) ×1
BSKT STON RTRVL ZERO TP 2.4FR (BASKET) ×1
CATH FOLEY 2W COUNCIL 20FR 5CC (CATHETERS) IMPLANT
CATH IMAGER II 65CM (CATHETERS) IMPLANT
CATH ROBINSON RED A/P 20FR (CATHETERS) IMPLANT
CATH SILICONE 24FR 30CC 3WAY (CATHETERS) ×2 IMPLANT
CATH URET DUAL LUMEN 6-10FR 50 (CATHETERS) ×3 IMPLANT
CATH UROLOGY TORQUE 40 (MISCELLANEOUS) IMPLANT
CATH X-FORCE N30 NEPHROSTOMY (TUBING) ×3 IMPLANT
CHLORAPREP W/TINT 26 (MISCELLANEOUS) ×1 IMPLANT
COVER SURGICAL LIGHT HANDLE (MISCELLANEOUS) ×3 IMPLANT
COVER WAND RF STERILE (DRAPES) IMPLANT
DRAPE C-ARM 42X120 X-RAY (DRAPES) ×3 IMPLANT
DRAPE LINGEMAN PERC (DRAPES) ×3 IMPLANT
DRAPE SURG IRRIG POUCH 19X23 (DRAPES) ×3 IMPLANT
DRSG PAD ABDOMINAL 8X10 ST (GAUZE/BANDAGES/DRESSINGS) ×6 IMPLANT
DRSG TEGADERM 8X12 (GAUZE/BANDAGES/DRESSINGS) ×6 IMPLANT
FIBER LASER FLEXIVA 365 (UROLOGICAL SUPPLIES) IMPLANT
FIBER LASER TRAC TIP (UROLOGICAL SUPPLIES) IMPLANT
GAUZE SPONGE 4X4 12PLY STRL (GAUZE/BANDAGES/DRESSINGS) ×3 IMPLANT
GLOVE BIOGEL M STRL SZ7.5 (GLOVE) ×11 IMPLANT
GOWN STRL REUS W/TWL LRG LVL3 (GOWN DISPOSABLE) ×5 IMPLANT
GUIDEWIRE AMPLAZ .035X145 (WIRE) ×6 IMPLANT
GUIDEWIRE STR DUAL SENSOR (WIRE) IMPLANT
KIT BASIN OR (CUSTOM PROCEDURE TRAY) ×3 IMPLANT
KIT PROBE 340X3.4XDISP GRN (MISCELLANEOUS) IMPLANT
KIT PROBE TRILOGY 3.4X340 (MISCELLANEOUS) ×3
KIT PROBE TRILOGY 3.9X350 (MISCELLANEOUS) IMPLANT
KIT TURNOVER KIT A (KITS) ×2 IMPLANT
MANIFOLD NEPTUNE II (INSTRUMENTS) ×3 IMPLANT
NS IRRIG 1000ML POUR BTL (IV SOLUTION) ×3 IMPLANT
PACK CYSTO (CUSTOM PROCEDURE TRAY) ×3 IMPLANT
SPONGE LAP 4X18 RFD (DISPOSABLE) ×3 IMPLANT
STENT ENDOURETEROTOMY 7-14 26C (STENTS) IMPLANT
SUT SILK 2 0 30  PSL (SUTURE)
SUT SILK 2 0 30 PSL (SUTURE) IMPLANT
SUT VIC AB 4-0 RB1 27 (SUTURE) ×3
SUT VIC AB 4-0 RB1 27XBRD (SUTURE) IMPLANT
SYR 20ML LL LF (SYRINGE) ×3 IMPLANT
TOWEL OR 17X26 10 PK STRL BLUE (TOWEL DISPOSABLE) ×3 IMPLANT
TRAY FOLEY MTR SLVR 16FR STAT (SET/KITS/TRAYS/PACK) ×3 IMPLANT
TUBING CONNECTING 10 (TUBING) ×3 IMPLANT
TUBING CONNECTING 10' (TUBING) ×2
TUBING STONE CATCHER TRILOGY (MISCELLANEOUS) ×2 IMPLANT
TUBING UROLOGY SET (TUBING) ×3 IMPLANT
WATER STERILE IRR 1000ML POUR (IV SOLUTION) ×3 IMPLANT

## 2020-03-15 NOTE — Anesthesia Postprocedure Evaluation (Signed)
Anesthesia Post Note  Patient: Victoria Andrade  Procedure(s) Performed: NEPHROLITHOTOMY PERCUTANEOUS/ INTERVENTIONAL RADIOLOCGY TO PLACE POSTERIOR CALYX PERCUTANEOUS ACCESS PRIOR (Right )     Patient location during evaluation: PACU Anesthesia Type: General Level of consciousness: sedated and patient cooperative Pain management: pain level controlled Vital Signs Assessment: post-procedure vital signs reviewed and stable Respiratory status: spontaneous breathing Cardiovascular status: stable Anesthetic complications: no    Last Vitals:  Vitals:   03/15/20 1838 03/15/20 2027  BP: 130/61 (!) 118/57  Pulse: 87 92  Resp: 19 17  Temp: (!) 36.4 C 36.9 C  SpO2: 99% 96%    Last Pain:  Vitals:   03/15/20 2027  TempSrc: Oral  PainSc:                  Nolon Nations

## 2020-03-15 NOTE — Anesthesia Preprocedure Evaluation (Signed)
Anesthesia Evaluation  Patient identified by MRN, date of birth, ID band Patient awake    Reviewed: Allergy & Precautions, NPO status , Patient's Chart, lab work & pertinent test results  History of Anesthesia Complications (+) PONV and history of anesthetic complications  Airway Mallampati: II  TM Distance: >3 FB Neck ROM: Full    Dental no notable dental hx. (+) Caps, Teeth Intact, Dental Advisory Given   Pulmonary neg pulmonary ROS,    Pulmonary exam normal breath sounds clear to auscultation       Cardiovascular hypertension, Pt. on medications Normal cardiovascular exam Rhythm:Regular Rate:Normal     Neuro/Psych  Headaches, Anxiety    GI/Hepatic Neg liver ROS, hiatal hernia, GERD  Medicated and Controlled,  Endo/Other  Obesity  Renal/GU Renal diseaseRenal calculi     Musculoskeletal  (+) Arthritis ,   Abdominal Normal abdominal exam  (+) + obese,   Peds  Hematology negative hematology ROS (+)   Anesthesia Other Findings Cleft uvula  Reproductive/Obstetrics Left adnexal mass                             Anesthesia Physical  Anesthesia Plan  ASA: II  Anesthesia Plan: General   Post-op Pain Management:    Induction: Intravenous  PONV Risk Score and Plan: 4 or greater and Ondansetron, Dexamethasone and Treatment may vary due to age or medical condition  Airway Management Planned: Oral ETT  Additional Equipment: None  Intra-op Plan:   Post-operative Plan: Extubation in OR  Informed Consent: I have reviewed the patients History and Physical, chart, labs and discussed the procedure including the risks, benefits and alternatives for the proposed anesthesia with the patient or authorized representative who has indicated his/her understanding and acceptance.     Dental advisory given  Plan Discussed with: CRNA  Anesthesia Plan Comments:         Anesthesia Quick  Evaluation

## 2020-03-15 NOTE — Transfer of Care (Signed)
Immediate Anesthesia Transfer of Care Note  Patient: Victoria Andrade  Procedure(s) Performed: NEPHROLITHOTOMY PERCUTANEOUS/ INTERVENTIONAL RADIOLOCGY TO PLACE POSTERIOR CALYX PERCUTANEOUS ACCESS PRIOR (Right )  Patient Location: PACU  Anesthesia Type:General  Level of Consciousness: awake, alert  and oriented  Airway & Oxygen Therapy: Patient Spontanous Breathing and Patient connected to face mask oxygen  Post-op Assessment: Report given to RN and Post -op Vital signs reviewed and stable  Post vital signs: Reviewed and stable  Last Vitals:  Vitals Value Taken Time  BP 140/68 03/15/20 1353  Temp    Pulse 101 03/15/20 1359  Resp 17 03/15/20 1359  SpO2 98 % 03/15/20 1359  Vitals shown include unvalidated device data.  Last Pain:  Vitals:   03/15/20 1030  PainSc: 0-No pain         Complications: No apparent anesthesia complications

## 2020-03-15 NOTE — Procedures (Signed)
  Procedure: R antegrade 82f nephroureteral catheter placed pre op EBL:   minimal Complications:  none immediate  See full dictation in [facility].  Dillard Cannon MD Main # [phone] Pager  [phone]

## 2020-03-15 NOTE — Anesthesia Procedure Notes (Signed)
Procedure Name: Intubation Date/Time: 03/15/2020 11:20 AM Performed by: Maxwell Caul, CRNA Pre-anesthesia Checklist: Patient identified, Emergency Drugs available, Suction available and Patient being monitored Patient Re-evaluated:Patient Re-evaluated prior to induction Oxygen Delivery Method: Circle system utilized Preoxygenation: Pre-oxygenation with 100% oxygen Induction Type: IV induction Ventilation: Mask ventilation without difficulty Laryngoscope Size: Mac and 4 Grade View: Grade II Tube type: Oral Tube size: 7.0 mm Number of attempts: 1 Airway Equipment and Method: Stylet Placement Confirmation: ETT inserted through vocal cords under direct vision,  positive ETCO2 and breath sounds checked- equal and bilateral Secured at: 21 cm Tube secured with: Tape Dental Injury: Teeth and Oropharynx as per pre-operative assessment

## 2020-03-15 NOTE — Plan of Care (Signed)
  Problem: Education: Goal: Required Educational Video(s) Outcome: Progressing   Problem: Education: Goal: Knowledge of General Education information will improve Description: Including pain rating scale, medication(s)/side effects and non-pharmacologic comfort measures Outcome: Progressing

## 2020-03-15 NOTE — Op Note (Signed)
Preoperative diagnosis: Right renal calculi  Postoperative diagnosis: Right renal calculi  Procedure: Right percutaneous nephrostolithotomy (2.1 cm)  Surgeon: Pryor Curia MD  Anesthesia: General  Complications: None  EBL: 150 cc  Specimen: Right renal calculi  Disposition of specimen: [facility]  Indication: Ms. Houy is a 74 year old female with bilateral renal calculi.  She was noted to have large burden right renal stones with evidence of mild hydronephrosis.  She elected to proceed with percutaneous nephrostolithotomy for treatment.  The potential risks, complications, and expected recovery process were discussed in detail.  Informed consent was obtained.  She was brought to the interventional radiology suite earlier today and access was obtained by Dr. Vernard Gambles into a posterior lower pole calyx.  Description of procedure: The patient was taken the operating room and a general anesthetic was administered.  She was placed in the prone position with care to pad all potential pressure points, prepped and draped in usual sterile fashion, and administered broad-spectrum preoperative antibiotics.  She had received vancomycin in interventional radiology.  Due to her extensive allergy list, she received gentamicin for gram-negative coverage prior to her nephrostolithotomy.  A preoperative timeout was performed.  Under fluoroscopic guidance, an Amplatz stiff wire was placed down the indwelling ureteral catheter that had been left in place by interventional radiology.  Once this wire was appropriately placed into the bladder, the angiographic catheter was removed and a dual-lumen catheter was placed over this wire into the proximal ureter.  A 0.38 sensor guidewire was then advanced down into the bladder under fluoroscopic guidance.  This was then exchanged for a stiff Amplatz wire via a ureteral catheter.  Once both wires were securely in place, one wire was selected for  the working wire.  The 19 [place] UroMax nephrostomy dilating balloon was then placed over the wire and positioned appropriately under fluoroscopic guidance.  It was then inflated with contrast into the balloon to 14 mmHg of pressure.  An incision had been previously made in the skin to allow passage of the balloon.  The 30 French nephrostomy sheath was then placed over the balloon and positioned just at the edge of the lower pole calyx under fluoroscopic guidance.  The balloon was then deflated and removed.  The rigid nephroscope was then used to visualize the collecting system.  Once a few clots were removed, the large renal pelvic stone could easily be identified.  Utilizing the trilogy ultrasound/lithotripter, the stone was fragmented using a combination of hydraulic and ultrasonic fragmentation.  All fragments were then removed with suction.  Additional smaller fragments were removed with a 2 prong grasper.  It was apparent that some stone fragments had moved down the ureter.  A flexible nephroscope was then utilized to examine the proximal ureter and these fragments were identified.  2 stones were removed with a 0 tip nitinol basket.  Contrast was injected down the remaining ureter with no further filling defects noted.  Attention then returned to the renal pelvis.  Further evaluation with both the flexible and rigid nephroscope revealed a few small remaining stones that were removed.  Once no further stones were identified, contrast was injected into the renal collecting system and no filling defects were noted.  Attention then turned to placement of the nephrostomy tube.  Upon removal of the rigid nephroscope, a 68 [place] council tip catheter was inserted into the renal pelvis.  The nephrostomy sheath was removed and there was noted to be a copious amount of venous bleeding.  This sheath was removed and the catheter was then removed and the nephrostomy balloon was inserted over the wire and reinflated for  tamponade purposes.  After approximately 5 minutes, the balloon was again deflated and reinspected.  There again appeared to be significant venous bleeding.  However, was identified that this was likely superficial.  Utilizing a hemostat, I was able to identify the venous bleeder and using a 4-0 Vicryl suture placed a figure-of-eight suture to control this bleeding.  Hemostasis at this point was excellent.  A 24 French catheter was then inserted over the wire.  This was nonlatex due to the patient's allergies.  This was positioned within the renal collecting system and the wire was removed.  In addition, an angiographic catheter was placed back over the safety wire down into the bladder and this wire was also removed.  2-0 silk sutures were used to tie these catheters in place to the skin.  Contrast was then injected through the nephrostomy catheter 1 final time.  There was noted to be extravasation at the level of the proximal ureter.  This had not been previously noted.  Further contrast injection revealed the contrast did readily go down the ureter and it was elected to simply leave nephrostomy tube in place with plans to perform a nephrostogram at a later time.  A sterile dressing was applied and the nephrostomy tube was placed to straight drainage.  The patient tolerated the procedure well.  She was able to be awakened and transferred to recovery unit in satisfactory condition.

## 2020-03-15 NOTE — H&P (Addendum)
Chief Complaint: Patient was seen in consultation today for right renal calculi/percutaneous right ureteral stent placement.  Referring Physician(s): Borden,Lester  Supervising Physician: Arne Cleveland  Patient Status: [facility] - Out-pt  History of Present Illness: Victoria Andrade is a 74 y.o. female with a past medical history of hypertension, migraines, bronchitis, IBS, GERD, fatty liver disease, hiatal hernia, nephrolithiasis, pre-diabetes, osteopenia, neuropathy, arthritis, macular degeneration, and anxiety. She has a history of nephrolithiasis and sees Dr. [name] for management. She was found to have multiple bilateral stones, R>L. She has tentative plans for nephrolithotripsy today in OR with Dr. [name].  IR requested by Dr. [name] for possible image-guided percutaneous right ureteral stent placement for access prior to OR for nephrolithotripsy.  Patient awake and alert sitting in bed. Complains of lower abdominal/pelvic pain when urinating. States "the stones move" when urinating, and that is the cause of her pain. Denies fever, chills, chest pain, dyspnea, or headache.   Past Medical History:  Diagnosis Date  . Anxiety    takes xanax  . Arthritis    fingers and neck  . Cataract immature   . Elevated hemoglobin A1c March 2016   level 6.2  . Fatty liver   . GERD (gastroesophageal reflux disease)   . History of bronchitis   . History of hiatal hernia   . History of kidney stones   . Hypertension   . IBS (irritable bowel syndrome)   . Imbalance   . Macular degeneration   . Melanoma ([place])    left eye  . Migraine   . Neuropathy   . Osteopenia 2016  . PONV (postoperative nausea and vomiting)    Benadryl help with N/V since she can no longer have SCOP  . Pre-diabetes   . Prediabetes 2019    Past Surgical History:  Procedure Laterality Date  . ABDOMINAL HYSTERECTOMY  1978   partial  . BILATERAL SALPINGOOPHORECTOMY  2010   with robot  . BREAST BIOPSY Right  03/06/2008   benign  . CATARACT EXTRACTION, BILATERAL  2018  . CHOLECYSTECTOMY  2005   lap  . COLONOSCOPY    . CYSTOSCOPY N/A 03/13/2015   Procedure: CYSTOSCOPY;  Surgeon: Nunzio Cobbs, MD;  Location: [facility];  Service: Gynecology;  Laterality: N/A;  . LITHOTRIPSY     20 years ago  . ROBOTIC ASSISTED SALPINGO OOPHERECTOMY Left 03/13/2015   Procedure: ROBOTIC ASSISTED INCISION OF  LEFT PELVIC MASS, LYSIS OF ADHESIONS  WITH PELVIC WASHINGS;  Surgeon: Nunzio Cobbs, MD;  Location: [facility];  Service: Gynecology;  Laterality: Left;  . SHOULDER SURGERY Left    bone spurs  . TONSILLECTOMY     as child  . TUBAL LIGATION  1976  . UPPER GI ENDOSCOPY      Allergies: Bystolic [nebivolol hcl], Latex, Lexapro [escitalopram], Oxycodone, Prednisone, Adhesive [tape], Aspirin, Chlorhexidine, Codeine, Elavil [amitriptyline hcl], Hydrocodone, Other, Tramadol, Cephalosporins, Ciprofloxacin, and Penicillins  Medications: Prior to Admission medications   Medication Sig Start Date End Date Taking? Authorizing Provider  ALPRAZolam Duanne Moron) 1 MG tablet Take 1.5 mg by mouth See admin instructions. Take 1.5 mg at night, may take a 0.5 mg dose as needed for anxiety    [provider]  Cholecalciferol (VITAMIN D3) 5000 UNITS CAPS Take 5,000 Units by mouth daily.     [provider]  diphenhydrAMINE (BENADRYL) 25 MG tablet Take 25 mg by mouth at bedtime.     [provider]  DULoxetine (CYMBALTA) 60 MG  capsule Take 60 mg by mouth at bedtime.    [provider]  famotidine (PEPCID) 20 MG tablet Take 20 mg by mouth daily as needed for heartburn or indigestion.     [provider]  hydrochlorothiazide (HYDRODIURIL) 25 MG tablet Take 25 mg by mouth See admin instructions. Take 25 mg in the morning, may take a second 25 mg at lunch if skipped linzess dose    [provider]  LINZESS 290 MCG CAPS capsule Take 290 mcg by mouth daily.  10/26/18    [provider]  loratadine (CLARITIN) 10 MG tablet Take 10 mg by mouth daily.    [provider]  montelukast (SINGULAIR) 10 MG tablet Take 10 mg by mouth at bedtime.    [provider]  Polyethyl Glycol-Propyl Glycol (SYSTANE ULTRA PF OP) Place 1 drop into both eyes at bedtime.    [provider]  Simethicone (GAS-X PO) Take 1 tablet by mouth daily as needed (gas).    [provider]     Family History  Adopted: Yes  Problem Relation Age of Onset  . Hypertension Mother   . Thyroid disease Mother   . Diabetes Maternal Grandmother   . Hypertension Maternal Grandmother   . Heart attack Maternal Grandmother   . Hypertension Maternal Grandfather   . Stroke Maternal Grandfather   . Stroke Maternal Uncle   . Stroke Maternal Uncle   . Heart attack Maternal Uncle   . Heart attack Maternal Uncle   . Lung cancer Maternal Uncle     Social History   Socioeconomic History  . Marital status: Widowed    Spouse name: Not on file  . Number of children: Not on file  . Years of education: Not on file  . Highest education level: Not on file  Occupational History  . Not on file  Tobacco Use  . Smoking status: Never Smoker  . Smokeless tobacco: Never Used  Substance and Sexual Activity  . Alcohol use: Yes    Alcohol/week: 1.0 standard drinks    Types: 1 Standard drinks or equivalent per week    Comment: occassionally  . Drug use: No  . Sexual activity: Not Currently    Partners: Male    Birth control/protection: Surgical    Comment: TAH 1978/BSO 2010  Other Topics Concern  . Not on file  Social History Narrative  . Not on file   Social Determinants of Health   Financial Resource Strain:   . Difficulty of Paying Living Expenses:   Food Insecurity:   . Worried About Charity fundraiser in the Last Year:   . Arboriculturist in the Last Year:   Transportation Needs:   . Film/video editor (Medical):   Marland Kitchen Lack of Transportation  (Non-Medical):   Physical Activity:   . Days of Exercise per Week:   . Minutes of Exercise per Session:   Stress:   . Feeling of Stress :   Social Connections:   . Frequency of Communication with Friends and Family:   . Frequency of Social Gatherings with Friends and Family:   . Attends Religious Services:   . Active Member of Clubs or Organizations:   . Attends Archivist Meetings:   Marland Kitchen Marital Status:      Review of Systems: A 12 point ROS discussed and pertinent positives are indicated in the HPI above.  All other systems are negative.  Review of Systems  Constitutional: Negative for chills  and fever.  Respiratory: Negative for shortness of breath and wheezing.   Cardiovascular: Negative for chest pain and palpitations.  Gastrointestinal: Positive for abdominal pain.  Genitourinary: Positive for pelvic pain.  Neurological: Negative for headaches.  Psychiatric/Behavioral: Negative for behavioral problems and confusion.    Vital Signs: LMP 12/29/1976 (Approximate)   Physical Exam Vitals and nursing note reviewed.  Constitutional:      General: She is not in acute distress.    Appearance: Normal appearance.  Cardiovascular:     Rate and Rhythm: Normal rate and regular rhythm.     Heart sounds: Normal heart sounds. No murmur.  Pulmonary:     Effort: Pulmonary effort is normal. No respiratory distress.     Breath sounds: Normal breath sounds. No wheezing.  Skin:    General: Skin is warm and dry.  Neurological:     Mental Status: She is alert and oriented to person, place, and time.  Psychiatric:        Mood and Affect: Mood normal.        Behavior: Behavior normal.      MD Evaluation Airway: WNL Heart: WNL Abdomen: WNL Chest/ Lungs: WNL ASA  Classification: 2 Mallampati/Airway Score: Two   Imaging: No results found.  Labs:  CBC: Recent Labs    03/09/20 1159  WBC 7.6  HGB 13.5  HCT 41.7  PLT 285    COAGS: No results for input(s):  INR, APTT in the last 8760 hours.  BMP: Recent Labs    03/09/20 1159  NA 139  K 3.7  CL 101  CO2 27  GLUCOSE 262*  BUN 20  CALCIUM 9.2  CREATININE 0.85  GFRNONAA >60  GFRAA >60     Assessment and Plan:  Right renal calculi with tentative plans for nephrolithotripsy today in OR with Dr. [name]. Plan for image-guided percutaneous right ureteral stent placement today in IR. Patient is NPO. Afebrile. She does not take blood thinners. INR pending.  Risks and benefits of percutaneous right ureteral stent placement were discussed with the patient including, but not limited to, infection, bleeding, significant bleeding causing loss or decrease in renal function or damage to adjacent structures. All of the patient's questions were answered, patient is agreeable to proceed. Consent signed and in chart.   Thank you for this interesting consult.  I greatly enjoyed meeting Victoria Andrade and look forward to participating in their care.  A copy of this report was sent to the requesting provider on this date.  Electronically Signed: Earley Abide, PA-C 03/15/2020, 8:21 AM   I spent a total of 30 Minutes in face to face in clinical consultation, greater than 50% of which was counseling/coordinating care for right renal calculi/percutaneous right ureteral stent placement.

## 2020-03-16 DIAGNOSIS — Z825 Family history of asthma and other chronic lower respiratory diseases: Secondary | ICD-10-CM | POA: Diagnosis not present

## 2020-03-16 DIAGNOSIS — Z87442 Personal history of urinary calculi: Secondary | ICD-10-CM | POA: Diagnosis not present

## 2020-03-16 DIAGNOSIS — Z9841 Cataract extraction status, right eye: Secondary | ICD-10-CM | POA: Diagnosis not present

## 2020-03-16 DIAGNOSIS — Z9049 Acquired absence of other specified parts of digestive tract: Secondary | ICD-10-CM | POA: Diagnosis not present

## 2020-03-16 DIAGNOSIS — Z9842 Cataract extraction status, left eye: Secondary | ICD-10-CM | POA: Diagnosis not present

## 2020-03-16 DIAGNOSIS — Z833 Family history of diabetes mellitus: Secondary | ICD-10-CM | POA: Diagnosis not present

## 2020-03-16 DIAGNOSIS — Z8349 Family history of other endocrine, nutritional and metabolic diseases: Secondary | ICD-10-CM | POA: Diagnosis not present

## 2020-03-16 DIAGNOSIS — Z823 Family history of stroke: Secondary | ICD-10-CM | POA: Diagnosis not present

## 2020-03-16 DIAGNOSIS — Z90710 Acquired absence of both cervix and uterus: Secondary | ICD-10-CM | POA: Diagnosis not present

## 2020-03-16 DIAGNOSIS — Z20822 Contact with and (suspected) exposure to covid-19: Secondary | ICD-10-CM | POA: Diagnosis present

## 2020-03-16 DIAGNOSIS — M8580 Other specified disorders of bone density and structure, unspecified site: Secondary | ICD-10-CM | POA: Diagnosis present

## 2020-03-16 DIAGNOSIS — Z801 Family history of malignant neoplasm of trachea, bronchus and lung: Secondary | ICD-10-CM | POA: Diagnosis not present

## 2020-03-16 DIAGNOSIS — I10 Essential (primary) hypertension: Secondary | ICD-10-CM | POA: Diagnosis present

## 2020-03-16 DIAGNOSIS — Z8249 Family history of ischemic heart disease and other diseases of the circulatory system: Secondary | ICD-10-CM | POA: Diagnosis not present

## 2020-03-16 DIAGNOSIS — N132 Hydronephrosis with renal and ureteral calculous obstruction: Secondary | ICD-10-CM | POA: Diagnosis present

## 2020-03-16 DIAGNOSIS — Z85820 Personal history of malignant melanoma of skin: Secondary | ICD-10-CM | POA: Diagnosis not present

## 2020-03-16 LAB — GLUCOSE, CAPILLARY
Glucose-Capillary: 199 mg/dL — ABNORMAL HIGH (ref 70–99)
Glucose-Capillary: 207 mg/dL — ABNORMAL HIGH (ref 70–99)
Glucose-Capillary: 226 mg/dL — ABNORMAL HIGH (ref 70–99)
Glucose-Capillary: 293 mg/dL — ABNORMAL HIGH (ref 70–99)
Glucose-Capillary: 179 mg/dL — ABNORMAL HIGH (ref 70–99)

## 2020-03-16 LAB — CBC
HCT: 32.8 % — ABNORMAL LOW (ref 36.0–46.0)
HCT: 31.3 % — ABNORMAL LOW (ref 36.0–46.0)
Hemoglobin: 10.7 g/dL — ABNORMAL LOW (ref 12.0–15.0)
Hemoglobin: 10.3 g/dL — ABNORMAL LOW (ref 12.0–15.0)
MCH: 30.7 pg (ref 26.0–34.0)
MCH: 31.1 pg (ref 26.0–34.0)
MCHC: 32.6 g/dL (ref 30.0–36.0)
MCHC: 32.9 g/dL (ref 30.0–36.0)
MCV: 94 fL (ref 80.0–100.0)
MCV: 94.6 fL (ref 80.0–100.0)
Platelets: 232 10*3/uL (ref 150–400)
Platelets: 212 10*3/uL (ref 150–400)
RBC: 3.49 MIL/uL — ABNORMAL LOW (ref 3.87–5.11)
RBC: 3.31 MIL/uL — ABNORMAL LOW (ref 3.87–5.11)
RDW: 12.9 % (ref 11.5–15.5)
RDW: 12.8 % (ref 11.5–15.5)
WBC: 25.3 10*3/uL — ABNORMAL HIGH (ref 4.0–10.5)
WBC: 20.3 10*3/uL — ABNORMAL HIGH (ref 4.0–10.5)
nRBC: 0 % (ref 0.0–0.2)
nRBC: 0 % (ref 0.0–0.2)

## 2020-03-16 LAB — BASIC METABOLIC PANEL
Anion gap: 11 (ref 5–15)
Anion gap: 12 (ref 5–15)
BUN: 17 mg/dL (ref 8–23)
BUN: 16 mg/dL (ref 8–23)
CO2: 24 mmol/L (ref 22–32)
CO2: 23 mmol/L (ref 22–32)
Calcium: 8.5 mg/dL — ABNORMAL LOW (ref 8.9–10.3)
Calcium: 8.3 mg/dL — ABNORMAL LOW (ref 8.9–10.3)
Chloride: 102 mmol/L (ref 98–111)
Chloride: 100 mmol/L (ref 98–111)
Creatinine, Ser: 1.19 mg/dL — ABNORMAL HIGH (ref 0.44–1.00)
Creatinine, Ser: 1.11 mg/dL — ABNORMAL HIGH (ref 0.44–1.00)
GFR calc Af Amer: 53 mL/min — ABNORMAL LOW (ref >60)
GFR calc Af Amer: 57 mL/min — ABNORMAL LOW (ref >60)
GFR calc non Af Amer: 46 mL/min — ABNORMAL LOW (ref >60)
GFR calc non Af Amer: 50 mL/min — ABNORMAL LOW (ref >60)
Glucose, Bld: 248 mg/dL — ABNORMAL HIGH (ref 70–99)
Glucose, Bld: 264 mg/dL — ABNORMAL HIGH (ref 70–99)
Potassium: 3.5 mmol/L (ref 3.5–5.1)
Potassium: 3.4 mmol/L — ABNORMAL LOW (ref 3.5–5.1)
Sodium: 137 mmol/L (ref 135–145)
Sodium: 135 mmol/L (ref 135–145)

## 2020-03-16 LAB — GENTAMICIN LEVEL, RANDOM: Gentamicin Rm: 4.5 ug/mL

## 2020-03-16 MED ORDER — GLIPIZIDE ER 10 MG PO TB24
10.0000 mg | ORAL_TABLET | Freq: Every day | ORAL | Status: DC
  Administered 2020-03-17: 10 mg via ORAL
  Filled 2020-03-16: qty 1

## 2020-03-16 MED ORDER — SIMETHICONE 80 MG PO CHEW
80.0000 mg | CHEWABLE_TABLET | Freq: Four times a day (QID) | ORAL | Status: DC | PRN
  Administered 2020-03-16: 80 mg via ORAL
  Filled 2020-03-16: qty 1

## 2020-03-16 MED ORDER — TRAMADOL HCL 50 MG PO TABS
50.0000 mg | ORAL_TABLET | Freq: Four times a day (QID) | ORAL | Status: DC | PRN
  Administered 2020-03-16 – 2020-03-17 (×5): 50 mg via ORAL
  Filled 2020-03-16 (×5): qty 1

## 2020-03-16 MED ORDER — LIVING WELL WITH DIABETES BOOK
Freq: Once | Status: DC
  Filled 2020-03-16: qty 1

## 2020-03-16 MED ORDER — VANCOMYCIN HCL IN DEXTROSE 1-5 GM/200ML-% IV SOLN
1000.0000 mg | INTRAVENOUS | Status: DC
  Administered 2020-03-16: 1000 mg via INTRAVENOUS
  Filled 2020-03-16: qty 200

## 2020-03-16 MED ORDER — GENTAMICIN SULFATE 40 MG/ML IJ SOLN
420.0000 mg | Freq: Once | INTRAVENOUS | Status: AC
  Administered 2020-03-16: 420 mg via INTRAVENOUS
  Filled 2020-03-16: qty 10.5

## 2020-03-16 MED ORDER — TRAMADOL HCL 50 MG PO TABS: 50.0000 mg | ORAL_TABLET | Freq: Four times a day (QID) | ORAL | 0 refills | Status: DC | PRN

## 2020-03-16 MED ORDER — POTASSIUM CHLORIDE IN NACL 20-0.9 MEQ/L-% IV SOLN
INTRAVENOUS | Status: DC
  Filled 2020-03-16 (×2): qty 1000

## 2020-03-16 MED ORDER — SODIUM CHLORIDE 0.9 % IV BOLUS
500.0000 mL | Freq: Once | INTRAVENOUS | Status: AC
  Administered 2020-03-16: 500 mL via INTRAVENOUS

## 2020-03-16 NOTE — Progress Notes (Signed)
   03/16/20 0200  Vitals  Temp (!) 100.7 F (38.2 C)  Temp Source Oral  BP 119/61  MAP (mmHg) 77  BP Location Right Arm  BP Method Automatic  Pulse Rate (!) 114  Resp 18  Oxygen Therapy  SpO2 97 %  O2 Device Nasal Cannula  O2 Flow Rate (L/min) 2 L/min  MEWS Score  MEWS Temp 1  MEWS Systolic 0  MEWS Pulse 2  MEWS RR 0  MEWS LOC 0  MEWS Score 3  MEWS Score Color Yellow  Provider Notification  Provider Name/Title J.Rosana Hoes DNP  Date Provider Notified 03/16/20  Time Provider Notified 0230  Notification Type Call  Notification Reason Change in status  Response No new orders  Date of Provider Response 03/16/20  Time of Provider Response 0240

## 2020-03-16 NOTE — Plan of Care (Signed)
  Problem: Clinical Measurements: Goal: Postoperative complications will be avoided or minimized Outcome: Progressing   Problem: Skin Integrity: Goal: Demonstration of wound healing without infection will improve Outcome: Progressing   Problem: Education: Goal: Knowledge of General Education information will improve Description: Including pain rating scale, medication(s)/side effects and non-pharmacologic comfort measures Outcome: Progressing   Problem: Clinical Measurements: Goal: Diagnostic test results will improve Outcome: Progressing

## 2020-03-16 NOTE — Progress Notes (Signed)
Patient ID: Victoria Andrade, female   DOB: 1946/07/05, 74 y.o.   MRN: [id number]  1 Day Post-Op Subjective: Pt s/p R PCNL yesterday afternoon. All visible stone removed.  Low grade fever (100.7) last night and slight tachycardia.    Objective: Vital signs in last 24 hours: Temp:  [97.5 F (36.4 C)-100.7 F (38.2 C)] 98.4 F (36.9 C) (03/19 0612) Pulse Rate:  [68-121] 108 (03/19 0612) Resp:  [11-19] 18 (03/19 0612) BP: (107-161)/(55-89) 124/62 (03/19 0612) SpO2:  [94 %-100 %] 96 % (03/19 0612) Weight:  [84.5 kg] 84.5 kg (03/18 1620)  Intake/Output from previous day: 03/18 0701 - 03/19 0700 In: 3725.3 [P.O.:480; I.V.:3132.3; IV Piggyback:108] Out: 1275 [Urine:1075; Blood:200] Intake/Output this shift: No intake/output data recorded.  Physical Exam:  General: Alert and oriented CV: Mild tachycardia, regular rhythm Lungs: Clear Abdomen: Soft, Mild tenderness on right abdomen and right CVA GU: R nephrostomy in place with pink urine draining well. Ext: NT, No erythema  Lab Results: Recent Labs    03/15/20 0825 03/15/20 1402 03/16/20 0508  HGB 13.8 11.3* 10.7*  HCT 41.3 35.4* 32.8*   CBC Latest Ref Rng & Units 03/16/2020 03/15/2020 03/15/2020  WBC 4.0 - 10.5 K/uL 25.3(H) - 9.6  Hemoglobin 12.0 - 15.0 g/dL 10.7(L) 11.3(L) 13.8  Hematocrit 36.0 - 46.0 % 32.8(L) 35.4(L) 41.3  Platelets 150 - 400 K/uL 232 - 277     BMET Recent Labs    03/15/20 1402 03/16/20 0508  NA 138 137  K 3.4* 3.5  CL 104 102  CO2 23 24  GLUCOSE 195* 248*  BUN 19 17  CREATININE 0.86 1.19*  CALCIUM 8.7* 8.5*     Studies/Results:   Assessment/Plan: POD # 1 s/p right PCNL 1) Low grade fever/mild tachycardia/leukocytosis: Improved with IVF.  Urine cultures sent this morning and will restart antibiotics with vancomycin and gentamicin for broad coverage (taking into consideration allergies).  2) Post-op: D/C Foley, continue nephrostomy drainage.  Oral pain control with tramadol (pt has had mild  itching in the past and will see how she tolerates after discussing with her). Ambulate, carb modified diet.  Re-evaluate this afternoon and recheck labs.  Plan for probable d/c this afternoon if doing well.   LOS: 0 days   Dutch Gray 03/16/2020, 7:35 AM

## 2020-03-16 NOTE — Evaluation (Signed)
Physical Therapy Evaluation Patient Details Name: Victoria Andrade MRN: [id number] DOB: [birth year] Today's Date: 03/16/2020   History of Present Illness  Patient is a 74 y.o. female admitted on 03/15/2020 with bilateral renal calculi s/p percutaneous right ureteral stent placement and right percutaneous nephrostolithotomy. PMH significant for melanoa, macular degeneration, HTN, GERD, DM, OA, anxiety.    Clinical Impression  Victoria Andrade is 74 y.o. female admitted with above HPI and diagnosis. Patient is currently limited by functional impairments below (see PT problem list). Patient lives alone and is independent at baseline. Patient will benefit from continued skilled PT interventions to address impairments and progress independence with mobility, recommending HHPT and RW to improve balance with mobility at this time. Acute PT will follow and progress as able.     Follow Up Recommendations Home health PT    Equipment Recommendations  Rolling walker with 5" wheels    Recommendations for Other Services       Precautions / Restrictions Precautions Precautions: Fall Restrictions Weight Bearing Restrictions: No      Mobility  Bed Mobility Overal bed mobility: Needs Assistance Bed Mobility: Supine to Sit     Supine to sit: HOB elevated;Mod assist     General bed mobility comments: cues for use of bed rail, assist required to mobilize LE's to EOB. Assist required to raise trunk up. pt limited by pain.   Transfers Overall transfer level: Needs assistance Equipment used: 1 person hand held assist Transfers: Sit to/from [facility] Sit to Stand: Min assist Stand pivot transfers: Min assist       General transfer comment: cues for HHA and use of IV pole to stedy wtih rising. Assist required to rise from EOB, and from toilet. Cues required to use grab bar by toilet to control lowering.  Ambulation/Gait Ambulation/Gait assistance: Min assist Gait Distance (Feet):  20 Feet Assistive device: IV Pole;None Gait Pattern/deviations: Step-through pattern;Decreased stride length;Trunk flexed Gait velocity: decreased   General Gait Details: cues for safe use fo IV pole to steady during gait and min assist to steady.   Stairs            Wheelchair Mobility    Modified Rankin (Stroke Patients Only)       Balance Overall balance assessment: Needs assistance Sitting-balance support: Feet supported Sitting balance-Leahy Scale: Good     Standing balance support: During functional activity;Bilateral upper extremity supported Standing balance-Leahy Scale: Fair            Pertinent Vitals/Pain Pain Assessment: 0-10 Pain Score: 8  Pain Location: Rt back Pain Descriptors / Indicators: Grimacing;Discomfort;Sore;Sharp Pain Intervention(s): Limited activity within patient's tolerance;Monitored during session;Heat applied    Home Living Family/patient expects to be discharged to:: Private residence Living Arrangements: Alone Available Help at Discharge: Family;Available 24 hours/day(pt's son will be staying with her until Tuesday night) Type of Home: House(townhome) Home Access: Stairs to enter Entrance Stairs-Rails: None Entrance Stairs-Number of Steps: 1 Home Layout: One level Home Equipment: Walker - 2 wheels;Cane - single point;Shower seat - built in(RW is old, it was her mothers)      Prior Function Level of Independence: Independent         Comments: pt lives alone and was indpendent with all mobility and ADL's     Hand Dominance   Dominant Hand: Right    Extremity/Trunk Assessment   Upper Extremity Assessment Upper Extremity Assessment: Overall WFL for tasks assessed    Lower Extremity Assessment Lower Extremity Assessment: Generalized weakness  Cervical / Trunk Assessment Cervical / Trunk Assessment: Normal;Other exceptions Cervical / Trunk Exceptions: pt standing with flexed posture due to pain  Communication    Communication: No difficulties  Cognition Arousal/Alertness: Awake/alert Behavior During Therapy: WFL for tasks assessed/performed Overall Cognitive Status: Within Functional Limits for tasks assessed      General Comments      Exercises     Assessment/Plan    PT Assessment Patient needs continued PT services  PT Problem List Decreased strength;Decreased activity tolerance;Decreased balance;Decreased mobility;Decreased cognition;Decreased knowledge of use of DME;Pain       PT Treatment Interventions Gait training;DME instruction;Stair training;Functional mobility training;Therapeutic activities;Therapeutic exercise;Balance training;Patient/family education    PT Goals (Current goals can be found in the Care Plan section)  Acute Rehab PT Goals Patient Stated Goal: to get home PT Goal Formulation: With patient Time For Goal Achievement: 03/23/20 Potential to Achieve Goals: Good    Frequency Min 3X/week    AM-PAC PT "6 Clicks" Mobility  Outcome Measure Help needed turning from your back to your side while in a flat bed without using bedrails?: A Little Help needed moving from lying on your back to sitting on the side of a flat bed without using bedrails?: A Little Help needed moving to and from a bed to a chair (including a wheelchair)?: A Little Help needed standing up from a chair using your arms (e.g., wheelchair or bedside chair)?: A Little Help needed to walk in hospital room?: A Little Help needed climbing 3-5 steps with a railing? : A Little 6 Click Score: 18    End of Session Equipment Utilized During Treatment: Gait belt Activity Tolerance: Patient tolerated treatment well Patient left: in chair;with chair alarm set;with family/visitor present Nurse Communication: Mobility status(tp requriesting hot packs) PT Visit Diagnosis: Difficulty in walking, not elsewhere classified (R26.2);Pain;Unsteadiness on feet (R26.81) Pain - Right/Left: Right Pain - part of body:  (back)    Time: [id number] PT Time Calculation (min) (ACUTE ONLY): 28 min   Charges:   PT Evaluation $PT Eval Low Complexity: 1 Low PT Treatments $Therapeutic Activity: 8-22 mins       Verner Mould, DPT Physical Therapist with [facility] [phone]  03/16/2020 2:13 PM

## 2020-03-16 NOTE — Care Management Obs Status (Signed)
[facility] NOTIFICATION   Patient Details  Name: Victoria Andrade MRN: [id number] Date of Birth: 26-Sep-1946   Medicare Observation Status Notification Given:  Macario Golds, [place] 03/16/2020, 3:27 PM

## 2020-03-16 NOTE — Progress Notes (Addendum)
Pharmacy Antibiotic Note  Victoria Andrade is a 74 y.o. female admitted on 03/15/2020 with bilateral renal calculi s/p percutaneous right ureteral stent placement and right percutaneous nephrostolithotomy. Pharmacy has been consulted for today for Vancomycin and Gentamicin dosing for possible UTI. Patient received a dose of Vancomycin and Gentamicin pre-operatively on 03/15/2020.   Plan: Vancomycin 1g IV q36h-start at 10am today.  Vancomycin levels at steady state, as indicated.  Gentamicin 420mg  (7mg /kg using adjusted body weight) IV x 1.  Check random Gentamicin level 10 hours after dose.   Monitor renal function (daily SCr), cultures, clinical course.   Height: 5' 1.5" (156.2 cm) Weight: 186 lb 6.4 oz (84.5 kg) IBW/kg (Calculated) : 48.95  Temp (24hrs), Avg:98.3 F (36.8 C), Min:97.5 F (36.4 C), Max:100.7 F (38.2 C)  Recent Labs  Lab 03/09/20 1159 03/15/20 0825 03/15/20 1402 03/16/20 0508  WBC 7.6 9.6  --  25.3*  CREATININE 0.85 0.98 0.86 1.19*    Estimated Creatinine Clearance: 42.6 mL/min (A) (by C-G formula based on SCr of 1.19 mg/dL (H)).    Allergies  Allergen Reactions  . Bystolic [Nebivolol Hcl] Shortness Of Breath and Rash    Any heart medications  . Latex Shortness Of Breath and Swelling    blister  . Lexapro [Escitalopram] Shortness Of Breath  . Oxycodone     Heart races  . Prednisone Swelling    Heart racing  . Adhesive [Tape]     Blisters   . Aspirin     Upsets ibs and acid reflux  . Chlorhexidine     Skins rash  . Codeine     Heart races  . Elavil [Amitriptyline Hcl]     Heart races  . Hydrocodone Itching    Feet burning and itching.   . Other     Pt states she is allergic to most antibiotics and can only take Zithromax.   . Tramadol Itching  . Cephalosporins Swelling and Rash  . Ciprofloxacin Swelling and Rash  . Penicillins Swelling and Rash    Did it involve swelling of the face/tongue/throat, SOB, or low BP? Yes Did it involve sudden or  severe rash/hives, skin peeling, or any reaction on the inside of your mouth or nose? No Did you need to seek medical attention at a hospital or doctor's office? Yes When did it last happen?20 + years If all above answers are "NO", may proceed with cephalosporin use.     Antimicrobials this admission: 3/18 Vancomycin, Gentamicin x 1 pre-operatively, resumed 3/19 >>  Dose adjustments this admission: --  Microbiology results: 3/19 UCx: sent  Thank you for allowing pharmacy to be a part of this patient's care.  Luiz Ochoa 03/16/2020 7:06 AM

## 2020-03-16 NOTE — Progress Notes (Signed)
Patient ID: Victoria Andrade, female   DOB: 03/04/1946, 74 y.o.   MRN: [id number] 1 Day Post-Op Subjective: Pt improved this afternoon.  However, still not ambulating well.  PT evaluated patient and recommends HHPT and rolling walker.  They plan to re-evaluate tomorrow.  Tolerating tramadol thus far with diphenhydramine for itching.  Objective: Vital signs in last 24 hours: Temp:  [97.5 F (36.4 C)-100.7 F (38.2 C)] 98 F (36.7 C) (03/19 1445) Pulse Rate:  [87-121] 98 (03/19 1445) Resp:  [17-19] 18 (03/19 1445) BP: (107-144)/(57-69) 125/69 (03/19 1445) SpO2:  [94 %-99 %] 96 % (03/19 1445)  Intake/Output from previous day: 03/18 0701 - 03/19 0700 In: 4006.5 [P.O.:600; I.V.:3168.5; IV Piggyback:233] Out: 1275 [Urine:1075; Blood:200] Intake/Output this shift: Total I/O In: 1378.4 [P.O.:360; I.V.:402.9; Other:305; IV Piggyback:310.5] Out: 465 [Urine:365; Stool:100]  Physical Exam:  General: Alert and oriented GU: PCN draining pink urine  Lab Results: Recent Labs    03/15/20 1402 03/16/20 0508 03/16/20 1407  HGB 11.3* 10.7* 10.3*  HCT 35.4* 32.8* 31.3*   CBC Latest Ref Rng & Units 03/16/2020 03/16/2020 03/15/2020  WBC 4.0 - 10.5 K/uL 20.3(H) 25.3(H) -  Hemoglobin 12.0 - 15.0 g/dL 10.3(L) 10.7(L) 11.3(L)  Hematocrit 36.0 - 46.0 % 31.3(L) 32.8(L) 35.4(L)  Platelets 150 - 400 K/uL 212 232 -     BMET Recent Labs    03/16/20 0508 03/16/20 1407  NA 137 135  K 3.5 3.4*  CL 102 100  CO2 24 23  GLUCOSE 248* 264*  BUN 17 16  CREATININE 1.19* 1.11*  CALCIUM 8.5* 8.3*     Studies/Results:  Assessment/Plan: POD # 1 s/p R PCNL - Doing better this afternoon.  Will continue IV antibiotics for now with urine cultures pending.  If afebrile tomorrow, likely can be discharged without antibiotics (considering extensive allergy list which includes TMP/SMX and doxycycline even though not listed in Epic).   - Continue PT.  Ordered HHPT and rolling walker.  - Hope for d/c tomorrow if  OK per PT.   LOS: 0 days   Dutch Gray 03/16/2020, 4:38 PM

## 2020-03-16 NOTE — Progress Notes (Signed)
Inpatient Diabetes Program Recommendations  AACE/ADA: New Consensus Statement on Inpatient Glycemic Control (2015)  Target Ranges:  Prepandial:   less than 140 mg/dL      Peak postprandial:   less than 180 mg/dL (1-2 hours)      Critically ill patients:  140 - 180 mg/dL   Lab Results  Component Value Date   GLUCAP 293 (H) 03/16/2020   HGBA1C 8.4 (H) 03/09/2020    Review of Glycemic Control  Diabetes history: DM2 Outpatient Diabetes medications: Januvia 25 mg QD, glipizide 10 mg QAM Current orders for Inpatient glycemic control: Novolog 0-15 units Q4H  HgbA1C - 8.4% - average blood sugar 194 mg/dL.  Inpatient Diabetes Program Recommendations:     Change Novolog to 0-15 units tidwc and 0-5 units QHS  Spoke with pt about HgbA1C of 8.4% and diagnosis of DM. Pt states she was on metformin years ago and could not tolerate it. Just started on Januvia and glipizide a couple of days ago. Recommended pt obtain glucose meter and check blood sugars once/day at different times and record. Take logbook to PCP for review. Discussed lifestyle changes such as weight loss and exercise which will have impact on blood sugars. Ordered Living Well with Diabetes book and answered questions.  Discussed above with RN.  Thank you. Lorenda Peck, RD, LDN, CDE Inpatient Diabetes Coordinator [phone]

## 2020-03-17 LAB — GLUCOSE, CAPILLARY
Glucose-Capillary: 158 mg/dL — ABNORMAL HIGH (ref 70–99)
Glucose-Capillary: 174 mg/dL — ABNORMAL HIGH (ref 70–99)
Glucose-Capillary: 180 mg/dL — ABNORMAL HIGH (ref 70–99)
Glucose-Capillary: 180 mg/dL — ABNORMAL HIGH (ref 70–99)

## 2020-03-17 LAB — URINE CULTURE
Culture: NO GROWTH
Culture: NO GROWTH
Special Requests: NORMAL
Special Requests: NORMAL

## 2020-03-17 LAB — BASIC METABOLIC PANEL
Anion gap: 9 (ref 5–15)
BUN: 18 mg/dL (ref 8–23)
CO2: 25 mmol/L (ref 22–32)
Calcium: 8.5 mg/dL — ABNORMAL LOW (ref 8.9–10.3)
Chloride: 105 mmol/L (ref 98–111)
Creatinine, Ser: 0.97 mg/dL (ref 0.44–1.00)
GFR calc Af Amer: >60 mL/min (ref >60)
GFR calc non Af Amer: 58 mL/min — ABNORMAL LOW (ref >60)
Glucose, Bld: 193 mg/dL — ABNORMAL HIGH (ref 70–99)
Potassium: 3.4 mmol/L — ABNORMAL LOW (ref 3.5–5.1)
Sodium: 139 mmol/L (ref 135–145)

## 2020-03-17 LAB — CBC
HCT: 29.5 % — ABNORMAL LOW (ref 36.0–46.0)
Hemoglobin: 9.7 g/dL — ABNORMAL LOW (ref 12.0–15.0)
MCH: 30.3 pg (ref 26.0–34.0)
MCHC: 32.9 g/dL (ref 30.0–36.0)
MCV: 92.2 fL (ref 80.0–100.0)
Platelets: 203 10*3/uL (ref 150–400)
RBC: 3.2 MIL/uL — ABNORMAL LOW (ref 3.87–5.11)
RDW: 13 % (ref 11.5–15.5)
WBC: 16.6 10*3/uL — ABNORMAL HIGH (ref 4.0–10.5)
nRBC: 0 % (ref 0.0–0.2)

## 2020-03-17 MED ORDER — GENTAMICIN SULFATE 40 MG/ML IJ SOLN
400.0000 mg | INTRAVENOUS | Status: DC
  Filled 2020-03-17: qty 10

## 2020-03-17 NOTE — TOC Initial Note (Signed)
Transition of Care [facility]) - Initial/Assessment Note    Patient Details  Name: Victoria Andrade MRN: [id number] Date of Birth: January 21, 1946  [facility]) CM/SW Contact:    Joaquin Courts, RN Phone Number: 03/17/2020, 1:50 PM  Clinical Narrative:  Patient set up with [facility] for [place].  [employer] to deliver rolling walker to bedside.                   Expected Discharge Plan: [place] Barriers to Discharge: No Barriers Identified   Patient Goals and CMS Choice Patient states their goals for this hospitalization and ongoing recovery are:: to go home CMS Medicare.gov Compare Post Acute Care list provided to:: Patient Choice offered to / list presented to : Patient  Expected Discharge Plan and Services Expected Discharge Plan: [place]   Discharge Planning Services: CM Consult Post Acute Care Choice: [place] arrangements for the past 2 months: Single Family Home Expected Discharge Date: 03/17/20               DME Arranged: [place]ford Rile rolling DME Agency: [employer] Date DME Agency Contacted: 03/17/20 Time DME Agency Contacted: 75 Representative spoke with at DME Agency: [place]: PT [place]: [place] Date [place]: 03/17/20 Time [place]: [place] Representative spoke with at [place]: Georgina Snell  Prior Living Arrangements/Services Living arrangements for the past 2 months: [place]   Patient language and need for interpreter reviewed:: Yes Do you feel safe going back to the place where you live?: Yes      Need for Family Participation in Patient Care: Yes (Comment) Care giver support system in place?: Yes (comment)   Criminal Activity/Legal Involvement Pertinent to Current Situation/Hospitalization: No - Comment as needed  Activities of Daily Living Home Assistive Devices/Equipment: Hearing aid, Eyeglasses, Blood pressure cuff, Cane (specify quad or straight) ADL  Screening (condition at time of admission) Patient's cognitive ability adequate to safely complete daily activities?: Yes Is the patient deaf or have difficulty hearing?: No Does the patient have difficulty seeing, even when wearing glasses/contacts?: No Does the patient have difficulty concentrating, remembering, or making decisions?: No Patient able to express need for assistance with ADLs?: Yes Does the patient have difficulty dressing or bathing?: No Independently performs ADLs?: Yes (appropriate for developmental age) Does the patient have difficulty walking or climbing stairs?: Yes Weakness of Legs: Both Weakness of Arms/Hands: Both  Permission Sought/Granted                  Emotional Assessment Appearance:: Appears stated age         Psych Involvement: No (comment)  Admission diagnosis:  Renal calculus, right [N20.0] Patient Active Problem List   Diagnosis Date Noted  . Renal calculus, right 03/15/2020  . OTHER DISEASES OF VOCAL CORDS 01/01/2009  . GERD 01/01/2009  . COUGH 01/01/2009   PCP:  Sharilyn Sites, MD Pharmacy:   [id number] [place], [address] [place] [address] Phone: [phone] Fax: [phone]     Social Determinants of Health (SDOH) Interventions    Readmission Risk Interventions No flowsheet data found.

## 2020-03-17 NOTE — Progress Notes (Signed)
Pharmacy Antibiotic Note: Brief note Gentamicin  Victoria Andrade is a 74 y.o. female admitted on 03/15/2020 with bilateral renal calculi s/p percutaneous right ureteral stent placement and right percutaneous nephrosolithotomy.  Pharmacy has been consulted for gentamicin dosing.  Plan: 2134 Random Gentamicin level = 4.5 mcg/ml Continue Gentamicin 400 mg IV q24h per [place] [facility] F/u scr/levels as needed    Height: 5' 1.5" (156.2 cm) Weight: 186 lb 6.4 oz (84.5 kg) IBW/kg (Calculated) : 48.95  Temp (24hrs), Avg:98.8 F (37.1 C), Min:98 F (36.7 C), Max:100.7 F (38.2 C)  Recent Labs  Lab 03/15/20 0825 03/15/20 1402 03/16/20 0508 03/16/20 1407 03/16/20 2134  WBC 9.6  --  25.3* 20.3*  --   CREATININE 0.98 0.86 1.19* 1.11*  --   GENTRANDOM  --   --   --   --  4.5    Estimated Creatinine Clearance: 45.7 mL/min (A) (by C-G formula based on SCr of 1.11 mg/dL (H)).    Allergies  Allergen Reactions  . Bystolic [Nebivolol Hcl] Shortness Of Breath and Rash    Any heart medications  . Latex Shortness Of Breath and Swelling    blister  . Lexapro [Escitalopram] Shortness Of Breath  . Oxycodone     Heart races  . Prednisone Swelling    Heart racing  . Adhesive [Tape]     Blisters   . Aspirin     Upsets ibs and acid reflux  . Chlorhexidine     Skins rash  . Codeine     Heart races  . Elavil [Amitriptyline Hcl]     Heart races  . Hydrocodone Itching    Feet burning and itching.   . Other     Pt states she is allergic to most antibiotics and can only take Zithromax.   . Tramadol Itching  . Cephalosporins Swelling and Rash  . Ciprofloxacin Swelling and Rash  . Penicillins Swelling and Rash    Did it involve swelling of the face/tongue/throat, SOB, or low BP? Yes Did it involve sudden or severe rash/hives, skin peeling, or any reaction on the inside of your mouth or nose? No Did you need to seek medical attention at a hospital or doctor's office? Yes When did it last  happen?20 + years If all above answers are "NO", may proceed with cephalosporin use.     Antimicrobials this admission: 3/19 vancomycin >>  3/19 gentamicin >>   Dose adjustments this admission:   Microbiology results:  BCx:   UCx:    Sputum:    MRSA PCR:   Thank you for allowing pharmacy to be a part of this patient's care.  Dorrene German 03/17/2020 12:13 AM

## 2020-03-17 NOTE — Discharge Summary (Signed)
Patient ID: Victoria Andrade MRN: [id number] DOB/AGE: 05/18/1946 74 y.o.  Admit date: 03/15/2020 Discharge date: 03/17/2020  Primary Care Physician:  Sharilyn Sites, MD  Discharge Diagnoses: Renal calculus     Discharge Medications: Allergies as of 03/17/2020      Reactions   Bystolic [nebivolol Hcl] Shortness Of Breath, Rash   Any heart medications   Latex Shortness Of Breath, Swelling   blister   Lexapro [escitalopram] Shortness Of Breath   Oxycodone    Heart races   Prednisone Swelling   Heart racing   Adhesive [tape]    Blisters    Aspirin    Upsets ibs and acid reflux   Chlorhexidine    Skins rash   Codeine    Heart races   Elavil [amitriptyline Hcl]    Heart races   Hydrocodone Itching   Feet burning and itching.    Other    Pt states she is allergic to most antibiotics and can only take Zithromax.    Tramadol Itching   Cephalosporins Swelling, Rash   Ciprofloxacin Swelling, Rash   Penicillins Swelling, Rash   Did it involve swelling of the face/tongue/throat, SOB, or low BP? Yes Did it involve sudden or severe rash/hives, skin peeling, or any reaction on the inside of your mouth or nose? No Did you need to seek medical attention at a hospital or doctor's office? Yes When did it last happen?20 + years If all above answers are "NO", may proceed with cephalosporin use.      Medication List    TAKE these medications   ALPRAZolam 1 MG tablet Commonly known as: XANAX Take 1.5 mg by mouth See admin instructions. Take 1.5 mg at night, may take a 0.5 mg dose as needed for anxiety   diphenhydrAMINE 25 MG tablet Commonly known as: BENADRYL Take 25 mg by mouth at bedtime.   DULoxetine 60 MG capsule Commonly known as: CYMBALTA Take 60 mg by mouth at bedtime.   famotidine 20 MG tablet Commonly known as: PEPCID Take 20 mg by mouth daily as needed for heartburn or indigestion.   GAS-X PO Take 1 tablet by mouth daily as needed (gas).   glipiZIDE 10 MG  tablet Commonly known as: GLUCOTROL Take 10 mg by mouth daily before breakfast.   hydrochlorothiazide 25 MG tablet Commonly known as: HYDRODIURIL Take 25 mg by mouth See admin instructions. Take 25 mg in the morning, may take a second 25 mg at lunch if skipped linzess dose   Linzess 290 MCG Caps capsule Generic drug: linaclotide Take 290 mcg by mouth daily.   loratadine 10 MG tablet Commonly known as: CLARITIN Take 10 mg by mouth daily.   montelukast 10 MG tablet Commonly known as: SINGULAIR Take 10 mg by mouth at bedtime.   sitaGLIPtin 25 MG tablet Commonly known as: JANUVIA Take 25 mg by mouth daily.   SYSTANE ULTRA PF OP Place 1 drop into both eyes at bedtime.   traMADol 50 MG tablet Commonly known as: ULTRAM Take 1-2 tablets (50-100 mg total) by mouth every 6 (six) hours as needed (pain).   Vitamin D3 125 MCG (5000 UT) Caps Take 5,000 Units by mouth daily.        Significant Diagnostic Studies:  DG C-Arm 1-60 Min-No Report  Result Date: 03/15/2020 Fluoroscopy was utilized by the requesting physician.  No radiographic interpretation.   IR URETERAL STENT RIGHT NEW ACCESS W/O SEP NEPHROSTOMY CATH  Result Date: 03/15/2020 CLINICAL DATA:  Symptomatic bilateral nephrolithiasis, right  worse than left; planned percutaneous nephrolithotomy EXAM: RIGHT PERCUTANEOUS NEPHROURETERAL CATHETER PLACEMENT UNDER FLUOROSCOPIC GUIDANCE FLUOROSCOPY TIME:  3.1 minutes; 510  uGym2 DAP TECHNIQUE: The procedure, risks (including but not limited to bleeding, infection, organ damage ), benefits, and alternatives were explained to the patient. Questions regarding the procedure were encouraged and answered. The patient understands and consents to the procedure. Laterality was confirmed and marked. Right flank region prepped with Betadine, draped in usual sterile fashion, infiltrated locally with 1% lidocaine. Intravenous Fentanyl 162mcg and Versed 2mg  were administered as conscious sedation  during continuous monitoring of the patient's level of consciousness and physiological / cardiorespiratory status by the radiology RN, with a total moderate sedation time of 12 minutes. Under real-time fluoroscopic guidance, a 21-gauge trocar needle was advanced into a posterior lower pole calyx using the peripheral radiodense calculus as a guide. Urine spontaneously returned through the needle. A 018 guidewire advanced easily. Needle was exchanged over a guidewire for transitional dilator. Contrast injection confirmed appropriate positioning. Catheter was exchanged over a guidewire for a 5 [place] Kumpe catheter, advanced into the urinary bladder. Radiograph confirms appropriate nephroureteral catheter positioning. Catheter capped and secured externally. The patient tolerated the procedure well. COMPLICATIONS: COMPLICATIONS None. IMPRESSION: 1. Technically successful right percutaneous nephroureteral catheter placement. Electronically Signed   By: Lucrezia Europe M.D.   On: 03/15/2020 10:51    Brief H and P: For complete details please refer to admission H and P, but in brief the patient is admitted for percutaneous management of a large right renal calculus  Hospital Course: The patient had an uncomplicated percutaneous nephrolithotomy.  She required physical therapy consultation postoperatively.  She was covered with dual antibiotic therapy including vancomycin and gentamicin which was continued until 03/17/2020.  Diet was advanced.  She had no complicating issues postoperatively and was discharged following physical therapy consultation on 03/17/2020 Active Problems:   Renal calculus, right   Day of Discharge BP (!) 147/68 (BP Location: Right Arm)   Pulse 82   Temp 97.9 F (36.6 C) (Oral)   Resp 14   Ht 5' 1.5" (1.562 m)   Wt 84.5 kg   LMP 12/29/1976 (Approximate)   SpO2 96%   BMI 34.65 kg/m   Results for orders placed or performed during the hospital encounter of 03/15/20 (from the past 24  hour(s))  Glucose, capillary     Status: Abnormal   Collection Time: 03/16/20 12:00 PM  Result Value Ref Range   Glucose-Capillary 293 (H) 70 - 99 mg/dL  CBC     Status: Abnormal   Collection Time: 03/16/20  2:07 PM  Result Value Ref Range   WBC 20.3 (H) 4.0 - 10.5 K/uL   RBC 3.31 (L) 3.87 - 5.11 MIL/uL   Hemoglobin 10.3 (L) 12.0 - 15.0 g/dL   HCT 31.3 (L) 36.0 - 46.0 %   MCV 94.6 80.0 - 100.0 fL   MCH 31.1 26.0 - 34.0 pg   MCHC 32.9 30.0 - 36.0 g/dL   RDW 12.8 11.5 - 15.5 %   Platelets 212 150 - 400 K/uL   nRBC 0.0 0.0 - 0.2 %  Basic metabolic panel     Status: Abnormal   Collection Time: 03/16/20  2:07 PM  Result Value Ref Range   Sodium 135 135 - 145 mmol/L   Potassium 3.4 (L) 3.5 - 5.1 mmol/L   Chloride 100 98 - 111 mmol/L   CO2 23 22 - 32 mmol/L   Glucose, Bld 264 (H) 70 - 99 mg/dL  BUN 16 8 - 23 mg/dL   Creatinine, Ser 1.11 (H) 0.44 - 1.00 mg/dL   Calcium 8.3 (L) 8.9 - 10.3 mg/dL   GFR calc non Af Amer 50 (L) >60 mL/min   GFR calc Af Amer 57 (L) >60 mL/min   Anion gap 12 5 - 15  Glucose, capillary     Status: Abnormal   Collection Time: 03/16/20  8:19 PM  Result Value Ref Range   Glucose-Capillary 179 (H) 70 - 99 mg/dL  Gentamicin level, random     Status: None   Collection Time: 03/16/20  9:34 PM  Result Value Ref Range   Gentamicin Rm 4.5 ug/mL  Glucose, capillary     Status: Abnormal   Collection Time: 03/17/20 12:03 AM  Result Value Ref Range   Glucose-Capillary 180 (H) 70 - 99 mg/dL  Glucose, capillary     Status: Abnormal   Collection Time: 03/17/20  4:05 AM  Result Value Ref Range   Glucose-Capillary 180 (H) 70 - 99 mg/dL  Basic metabolic panel     Status: Abnormal   Collection Time: 03/17/20  4:29 AM  Result Value Ref Range   Sodium 139 135 - 145 mmol/L   Potassium 3.4 (L) 3.5 - 5.1 mmol/L   Chloride 105 98 - 111 mmol/L   CO2 25 22 - 32 mmol/L   Glucose, Bld 193 (H) 70 - 99 mg/dL   BUN 18 8 - 23 mg/dL   Creatinine, Ser 0.97 0.44 - 1.00 mg/dL    Calcium 8.5 (L) 8.9 - 10.3 mg/dL   GFR calc non Af Amer 58 (L) >60 mL/min   GFR calc Af Amer >60 >60 mL/min   Anion gap 9 5 - 15  CBC     Status: Abnormal   Collection Time: 03/17/20  4:29 AM  Result Value Ref Range   WBC 16.6 (H) 4.0 - 10.5 K/uL   RBC 3.20 (L) 3.87 - 5.11 MIL/uL   Hemoglobin 9.7 (L) 12.0 - 15.0 g/dL   HCT 29.5 (L) 36.0 - 46.0 %   MCV 92.2 80.0 - 100.0 fL   MCH 30.3 26.0 - 34.0 pg   MCHC 32.9 30.0 - 36.0 g/dL   RDW 13.0 11.5 - 15.5 %   Platelets 203 150 - 400 K/uL   nRBC 0.0 0.0 - 0.2 %  Glucose, capillary     Status: Abnormal   Collection Time: 03/17/20  7:46 AM  Result Value Ref Range   Glucose-Capillary 158 (H) 70 - 99 mg/dL    Physical Exam: General: Alert and awake oriented x3 not in any acute distress. HEENT: anicteric sclera, pupils reactive to light and accommodation CVS: S1-S2 clear no murmur rubs or gallops Chest: clear to auscultation bilaterally, no wheezing rales or rhonchi Abdomen: soft nontender, nondistended, normal bowel sounds, no organomegaly Extremities: no cyanosis, clubbing or edema noted bilaterally Neuro: Cranial nerves II-XII intact, no focal neurological deficits  Disposition: Home  Diet: Discussed with patient  Activity: Discussed with patient   Disposition and Follow-up:  Discharge Instructions    Diet general   Complete by: As directed    Increase activity slowly   Complete by: As directed      Follow-up scheduled on Tuesday, March 23   [place]    Raynelle Bring, MD Follow up.   Specialty: Urology Why: Follow up next Tuesday 03/20/20 at 11:00 AM at radiology at [facility] and 3:00 PM with Dr. [name]  04/03/20 at 9:15 Contact  information: [place] [id number] [phone]           Time spent on Discharge:  15 minutes  Signed: Lillette Boxer Vasiliy Mccarry 03/17/2020, 10:12 AM

## 2020-03-19 ENCOUNTER — Other Ambulatory Visit (HOSPITAL_COMMUNITY): Payer: Self-pay | Admitting: Urology

## 2020-03-19 DIAGNOSIS — Z79891 Long term (current) use of opiate analgesic: Secondary | ICD-10-CM | POA: Diagnosis not present

## 2020-03-19 DIAGNOSIS — M1990 Unspecified osteoarthritis, unspecified site: Secondary | ICD-10-CM | POA: Diagnosis not present

## 2020-03-19 DIAGNOSIS — K219 Gastro-esophageal reflux disease without esophagitis: Secondary | ICD-10-CM | POA: Diagnosis not present

## 2020-03-19 DIAGNOSIS — K581 Irritable bowel syndrome with constipation: Secondary | ICD-10-CM | POA: Diagnosis not present

## 2020-03-19 DIAGNOSIS — I10 Essential (primary) hypertension: Secondary | ICD-10-CM | POA: Diagnosis not present

## 2020-03-19 DIAGNOSIS — F419 Anxiety disorder, unspecified: Secondary | ICD-10-CM | POA: Diagnosis not present

## 2020-03-19 DIAGNOSIS — Z48816 Encounter for surgical aftercare following surgery on the genitourinary system: Secondary | ICD-10-CM | POA: Diagnosis not present

## 2020-03-19 DIAGNOSIS — N200 Calculus of kidney: Secondary | ICD-10-CM | POA: Diagnosis not present

## 2020-03-19 DIAGNOSIS — E119 Type 2 diabetes mellitus without complications: Secondary | ICD-10-CM | POA: Diagnosis not present

## 2020-03-19 LAB — GLUCOSE, CAPILLARY: Glucose-Capillary: 179 mg/dL — ABNORMAL HIGH (ref 70–99)

## 2020-03-20 ENCOUNTER — Ambulatory Visit (HOSPITAL_COMMUNITY)
Admission: RE | Admit: 2020-03-20 | Discharge: 2020-03-20 | Disposition: A | Discharge status: Home / Self Care | Payer: Medicare PPO | Source: Ambulatory Visit | Attending: Urology | Admitting: Urology

## 2020-03-20 ENCOUNTER — Other Ambulatory Visit (HOSPITAL_COMMUNITY): Payer: Self-pay | Admitting: Interventional Radiology

## 2020-03-20 ENCOUNTER — Other Ambulatory Visit: Payer: Self-pay

## 2020-03-20 ENCOUNTER — Ambulatory Visit (HOSPITAL_COMMUNITY)
Admission: RE | Admit: 2020-03-20 | Discharge: 2020-03-20 | Disposition: A | Discharge status: Home / Self Care | Payer: Medicare PPO | Source: Ambulatory Visit | Attending: Interventional Radiology | Admitting: Interventional Radiology

## 2020-03-20 DIAGNOSIS — Z436 Encounter for attention to other artificial openings of urinary tract: Secondary | ICD-10-CM | POA: Diagnosis not present

## 2020-03-20 DIAGNOSIS — N200 Calculus of kidney: Secondary | ICD-10-CM | POA: Diagnosis not present

## 2020-03-20 HISTORY — PX: IR NEPHRO TUBE REMOV/FL: IMG2342

## 2020-03-20 HISTORY — PX: IR NEPHROSTOGRAM RIGHT THRU EXISTING ACCESS: IMG6062

## 2020-03-20 MED ORDER — IOHEXOL 300 MG/ML  SOLN
50.0000 mL | Freq: Once | INTRAMUSCULAR | Status: AC | PRN
  Administered 2020-03-20: 5 mL

## 2020-03-20 MED ORDER — IOHEXOL 300 MG/ML  SOLN
50.0000 mL | Freq: Once | INTRAMUSCULAR | Status: AC | PRN
  Administered 2020-03-20: 10 mL

## 2020-03-21 ENCOUNTER — Other Ambulatory Visit: Payer: Medicare Other

## 2020-03-21 DIAGNOSIS — M1990 Unspecified osteoarthritis, unspecified site: Secondary | ICD-10-CM | POA: Diagnosis not present

## 2020-03-21 DIAGNOSIS — E119 Type 2 diabetes mellitus without complications: Secondary | ICD-10-CM | POA: Diagnosis not present

## 2020-03-21 DIAGNOSIS — Z48816 Encounter for surgical aftercare following surgery on the genitourinary system: Secondary | ICD-10-CM | POA: Diagnosis not present

## 2020-03-21 DIAGNOSIS — I10 Essential (primary) hypertension: Secondary | ICD-10-CM | POA: Diagnosis not present

## 2020-03-21 DIAGNOSIS — K219 Gastro-esophageal reflux disease without esophagitis: Secondary | ICD-10-CM | POA: Diagnosis not present

## 2020-03-21 DIAGNOSIS — Z79891 Long term (current) use of opiate analgesic: Secondary | ICD-10-CM | POA: Diagnosis not present

## 2020-03-21 DIAGNOSIS — K581 Irritable bowel syndrome with constipation: Secondary | ICD-10-CM | POA: Diagnosis not present

## 2020-03-21 DIAGNOSIS — F419 Anxiety disorder, unspecified: Secondary | ICD-10-CM | POA: Diagnosis not present

## 2020-03-21 DIAGNOSIS — N200 Calculus of kidney: Secondary | ICD-10-CM | POA: Diagnosis not present

## 2020-03-23 DIAGNOSIS — N200 Calculus of kidney: Secondary | ICD-10-CM | POA: Diagnosis not present

## 2020-03-23 DIAGNOSIS — Z6833 Body mass index (BMI) 33.0-33.9, adult: Secondary | ICD-10-CM | POA: Diagnosis not present

## 2020-03-23 DIAGNOSIS — E6609 Other obesity due to excess calories: Secondary | ICD-10-CM | POA: Diagnosis not present

## 2020-03-26 DIAGNOSIS — K581 Irritable bowel syndrome with constipation: Secondary | ICD-10-CM | POA: Diagnosis not present

## 2020-03-26 DIAGNOSIS — E119 Type 2 diabetes mellitus without complications: Secondary | ICD-10-CM | POA: Diagnosis not present

## 2020-03-26 DIAGNOSIS — N200 Calculus of kidney: Secondary | ICD-10-CM | POA: Diagnosis not present

## 2020-03-26 DIAGNOSIS — F419 Anxiety disorder, unspecified: Secondary | ICD-10-CM | POA: Diagnosis not present

## 2020-03-26 DIAGNOSIS — K219 Gastro-esophageal reflux disease without esophagitis: Secondary | ICD-10-CM | POA: Diagnosis not present

## 2020-03-26 DIAGNOSIS — I10 Essential (primary) hypertension: Secondary | ICD-10-CM | POA: Diagnosis not present

## 2020-03-26 DIAGNOSIS — M1990 Unspecified osteoarthritis, unspecified site: Secondary | ICD-10-CM | POA: Diagnosis not present

## 2020-03-26 DIAGNOSIS — Z48816 Encounter for surgical aftercare following surgery on the genitourinary system: Secondary | ICD-10-CM | POA: Diagnosis not present

## 2020-03-26 DIAGNOSIS — Z79891 Long term (current) use of opiate analgesic: Secondary | ICD-10-CM | POA: Diagnosis not present

## 2020-03-29 DIAGNOSIS — M1990 Unspecified osteoarthritis, unspecified site: Secondary | ICD-10-CM | POA: Diagnosis not present

## 2020-03-29 DIAGNOSIS — N200 Calculus of kidney: Secondary | ICD-10-CM | POA: Diagnosis not present

## 2020-03-29 DIAGNOSIS — E119 Type 2 diabetes mellitus without complications: Secondary | ICD-10-CM | POA: Diagnosis not present

## 2020-03-29 DIAGNOSIS — Z79891 Long term (current) use of opiate analgesic: Secondary | ICD-10-CM | POA: Diagnosis not present

## 2020-03-29 DIAGNOSIS — F419 Anxiety disorder, unspecified: Secondary | ICD-10-CM | POA: Diagnosis not present

## 2020-03-29 DIAGNOSIS — Z48816 Encounter for surgical aftercare following surgery on the genitourinary system: Secondary | ICD-10-CM | POA: Diagnosis not present

## 2020-03-29 DIAGNOSIS — K219 Gastro-esophageal reflux disease without esophagitis: Secondary | ICD-10-CM | POA: Diagnosis not present

## 2020-03-29 DIAGNOSIS — K581 Irritable bowel syndrome with constipation: Secondary | ICD-10-CM | POA: Diagnosis not present

## 2020-03-29 DIAGNOSIS — I10 Essential (primary) hypertension: Secondary | ICD-10-CM | POA: Diagnosis not present

## 2020-04-02 ENCOUNTER — Encounter: Payer: Self-pay | Admitting: Anesthesiology

## 2020-04-02 NOTE — Addendum Note (Signed)
Addendum  created 04/02/20 [id number] by Nolon Nations, MD   Intraprocedure Event edited, Intraprocedure Staff edited

## 2020-04-03 DIAGNOSIS — E119 Type 2 diabetes mellitus without complications: Secondary | ICD-10-CM | POA: Diagnosis not present

## 2020-04-03 DIAGNOSIS — M1990 Unspecified osteoarthritis, unspecified site: Secondary | ICD-10-CM | POA: Diagnosis not present

## 2020-04-03 DIAGNOSIS — Z48816 Encounter for surgical aftercare following surgery on the genitourinary system: Secondary | ICD-10-CM | POA: Diagnosis not present

## 2020-04-03 DIAGNOSIS — K581 Irritable bowel syndrome with constipation: Secondary | ICD-10-CM | POA: Diagnosis not present

## 2020-04-03 DIAGNOSIS — N200 Calculus of kidney: Secondary | ICD-10-CM | POA: Diagnosis not present

## 2020-04-03 DIAGNOSIS — K219 Gastro-esophageal reflux disease without esophagitis: Secondary | ICD-10-CM | POA: Diagnosis not present

## 2020-04-03 DIAGNOSIS — Z79891 Long term (current) use of opiate analgesic: Secondary | ICD-10-CM | POA: Diagnosis not present

## 2020-04-03 DIAGNOSIS — I10 Essential (primary) hypertension: Secondary | ICD-10-CM | POA: Diagnosis not present

## 2020-04-03 DIAGNOSIS — F419 Anxiety disorder, unspecified: Secondary | ICD-10-CM | POA: Diagnosis not present

## 2020-04-04 DIAGNOSIS — Z79891 Long term (current) use of opiate analgesic: Secondary | ICD-10-CM | POA: Diagnosis not present

## 2020-04-04 DIAGNOSIS — F419 Anxiety disorder, unspecified: Secondary | ICD-10-CM | POA: Diagnosis not present

## 2020-04-04 DIAGNOSIS — N200 Calculus of kidney: Secondary | ICD-10-CM | POA: Diagnosis not present

## 2020-04-04 DIAGNOSIS — Z48816 Encounter for surgical aftercare following surgery on the genitourinary system: Secondary | ICD-10-CM | POA: Diagnosis not present

## 2020-04-04 DIAGNOSIS — K581 Irritable bowel syndrome with constipation: Secondary | ICD-10-CM | POA: Diagnosis not present

## 2020-04-04 DIAGNOSIS — M1990 Unspecified osteoarthritis, unspecified site: Secondary | ICD-10-CM | POA: Diagnosis not present

## 2020-04-04 DIAGNOSIS — K219 Gastro-esophageal reflux disease without esophagitis: Secondary | ICD-10-CM | POA: Diagnosis not present

## 2020-04-04 DIAGNOSIS — I10 Essential (primary) hypertension: Secondary | ICD-10-CM | POA: Diagnosis not present

## 2020-04-04 DIAGNOSIS — E119 Type 2 diabetes mellitus without complications: Secondary | ICD-10-CM | POA: Diagnosis not present

## 2020-04-09 DIAGNOSIS — F419 Anxiety disorder, unspecified: Secondary | ICD-10-CM | POA: Diagnosis not present

## 2020-04-09 DIAGNOSIS — N200 Calculus of kidney: Secondary | ICD-10-CM | POA: Diagnosis not present

## 2020-04-09 DIAGNOSIS — I10 Essential (primary) hypertension: Secondary | ICD-10-CM | POA: Diagnosis not present

## 2020-04-09 DIAGNOSIS — M1990 Unspecified osteoarthritis, unspecified site: Secondary | ICD-10-CM | POA: Diagnosis not present

## 2020-04-09 DIAGNOSIS — K219 Gastro-esophageal reflux disease without esophagitis: Secondary | ICD-10-CM | POA: Diagnosis not present

## 2020-04-09 DIAGNOSIS — K581 Irritable bowel syndrome with constipation: Secondary | ICD-10-CM | POA: Diagnosis not present

## 2020-04-09 DIAGNOSIS — Z48816 Encounter for surgical aftercare following surgery on the genitourinary system: Secondary | ICD-10-CM | POA: Diagnosis not present

## 2020-04-09 DIAGNOSIS — Z79891 Long term (current) use of opiate analgesic: Secondary | ICD-10-CM | POA: Diagnosis not present

## 2020-04-09 DIAGNOSIS — E119 Type 2 diabetes mellitus without complications: Secondary | ICD-10-CM | POA: Diagnosis not present

## 2020-04-10 DIAGNOSIS — Z48816 Encounter for surgical aftercare following surgery on the genitourinary system: Secondary | ICD-10-CM | POA: Diagnosis not present

## 2020-04-10 DIAGNOSIS — K581 Irritable bowel syndrome with constipation: Secondary | ICD-10-CM | POA: Diagnosis not present

## 2020-04-10 DIAGNOSIS — M1990 Unspecified osteoarthritis, unspecified site: Secondary | ICD-10-CM | POA: Diagnosis not present

## 2020-04-10 DIAGNOSIS — E119 Type 2 diabetes mellitus without complications: Secondary | ICD-10-CM | POA: Diagnosis not present

## 2020-04-10 DIAGNOSIS — N200 Calculus of kidney: Secondary | ICD-10-CM | POA: Diagnosis not present

## 2020-04-10 DIAGNOSIS — I10 Essential (primary) hypertension: Secondary | ICD-10-CM | POA: Diagnosis not present

## 2020-04-10 DIAGNOSIS — Z79891 Long term (current) use of opiate analgesic: Secondary | ICD-10-CM | POA: Diagnosis not present

## 2020-04-10 DIAGNOSIS — F419 Anxiety disorder, unspecified: Secondary | ICD-10-CM | POA: Diagnosis not present

## 2020-04-10 DIAGNOSIS — K219 Gastro-esophageal reflux disease without esophagitis: Secondary | ICD-10-CM | POA: Diagnosis not present

## 2020-04-11 DIAGNOSIS — K581 Irritable bowel syndrome with constipation: Secondary | ICD-10-CM | POA: Diagnosis not present

## 2020-04-11 DIAGNOSIS — Z79891 Long term (current) use of opiate analgesic: Secondary | ICD-10-CM | POA: Diagnosis not present

## 2020-04-11 DIAGNOSIS — M1990 Unspecified osteoarthritis, unspecified site: Secondary | ICD-10-CM | POA: Diagnosis not present

## 2020-04-11 DIAGNOSIS — I10 Essential (primary) hypertension: Secondary | ICD-10-CM | POA: Diagnosis not present

## 2020-04-11 DIAGNOSIS — Z48816 Encounter for surgical aftercare following surgery on the genitourinary system: Secondary | ICD-10-CM | POA: Diagnosis not present

## 2020-04-11 DIAGNOSIS — F419 Anxiety disorder, unspecified: Secondary | ICD-10-CM | POA: Diagnosis not present

## 2020-04-11 DIAGNOSIS — K219 Gastro-esophageal reflux disease without esophagitis: Secondary | ICD-10-CM | POA: Diagnosis not present

## 2020-04-11 DIAGNOSIS — N200 Calculus of kidney: Secondary | ICD-10-CM | POA: Diagnosis not present

## 2020-04-11 DIAGNOSIS — E119 Type 2 diabetes mellitus without complications: Secondary | ICD-10-CM | POA: Diagnosis not present

## 2020-04-16 ENCOUNTER — Ambulatory Visit
Admission: RE | Admit: 2020-04-16 | Discharge: 2020-04-16 | Disposition: A | Discharge status: Home / Self Care | Payer: Medicare PPO | Source: Ambulatory Visit | Attending: Family Medicine | Admitting: Family Medicine

## 2020-04-16 ENCOUNTER — Other Ambulatory Visit: Payer: Self-pay

## 2020-04-16 DIAGNOSIS — M8589 Other specified disorders of bone density and structure, multiple sites: Secondary | ICD-10-CM | POA: Diagnosis not present

## 2020-04-16 DIAGNOSIS — E2839 Other primary ovarian failure: Secondary | ICD-10-CM

## 2020-04-17 DIAGNOSIS — Z79891 Long term (current) use of opiate analgesic: Secondary | ICD-10-CM | POA: Diagnosis not present

## 2020-04-17 DIAGNOSIS — F419 Anxiety disorder, unspecified: Secondary | ICD-10-CM | POA: Diagnosis not present

## 2020-04-17 DIAGNOSIS — K581 Irritable bowel syndrome with constipation: Secondary | ICD-10-CM | POA: Diagnosis not present

## 2020-04-17 DIAGNOSIS — Z48816 Encounter for surgical aftercare following surgery on the genitourinary system: Secondary | ICD-10-CM | POA: Diagnosis not present

## 2020-04-17 DIAGNOSIS — E119 Type 2 diabetes mellitus without complications: Secondary | ICD-10-CM | POA: Diagnosis not present

## 2020-04-17 DIAGNOSIS — M1990 Unspecified osteoarthritis, unspecified site: Secondary | ICD-10-CM | POA: Diagnosis not present

## 2020-04-17 DIAGNOSIS — K219 Gastro-esophageal reflux disease without esophagitis: Secondary | ICD-10-CM | POA: Diagnosis not present

## 2020-04-17 DIAGNOSIS — I10 Essential (primary) hypertension: Secondary | ICD-10-CM | POA: Diagnosis not present

## 2020-04-17 DIAGNOSIS — N200 Calculus of kidney: Secondary | ICD-10-CM | POA: Diagnosis not present

## 2020-04-20 DIAGNOSIS — N200 Calculus of kidney: Secondary | ICD-10-CM | POA: Diagnosis not present

## 2020-05-17 DIAGNOSIS — E7849 Other hyperlipidemia: Secondary | ICD-10-CM | POA: Diagnosis not present

## 2020-05-17 DIAGNOSIS — I10 Essential (primary) hypertension: Secondary | ICD-10-CM | POA: Diagnosis not present

## 2020-05-17 DIAGNOSIS — Z6833 Body mass index (BMI) 33.0-33.9, adult: Secondary | ICD-10-CM | POA: Diagnosis not present

## 2020-05-17 DIAGNOSIS — E6609 Other obesity due to excess calories: Secondary | ICD-10-CM | POA: Diagnosis not present

## 2020-05-17 DIAGNOSIS — K589 Irritable bowel syndrome without diarrhea: Secondary | ICD-10-CM | POA: Diagnosis not present

## 2020-05-17 DIAGNOSIS — M1990 Unspecified osteoarthritis, unspecified site: Secondary | ICD-10-CM | POA: Diagnosis not present

## 2020-05-17 DIAGNOSIS — F419 Anxiety disorder, unspecified: Secondary | ICD-10-CM | POA: Diagnosis not present

## 2020-05-17 DIAGNOSIS — E119 Type 2 diabetes mellitus without complications: Secondary | ICD-10-CM | POA: Diagnosis not present

## 2020-05-17 DIAGNOSIS — N200 Calculus of kidney: Secondary | ICD-10-CM | POA: Diagnosis not present

## 2020-05-17 DIAGNOSIS — E1165 Type 2 diabetes mellitus with hyperglycemia: Secondary | ICD-10-CM | POA: Diagnosis not present

## 2020-05-17 DIAGNOSIS — E278 Other specified disorders of adrenal gland: Secondary | ICD-10-CM | POA: Diagnosis not present

## 2020-05-24 ENCOUNTER — Other Ambulatory Visit: Payer: Self-pay | Admitting: Obstetrics and Gynecology

## 2020-05-24 DIAGNOSIS — Z1231 Encounter for screening mammogram for malignant neoplasm of breast: Secondary | ICD-10-CM

## 2020-06-25 ENCOUNTER — Other Ambulatory Visit: Payer: Self-pay

## 2020-06-25 ENCOUNTER — Ambulatory Visit
Admission: RE | Admit: 2020-06-25 | Discharge: 2020-06-25 | Disposition: A | Discharge status: Home / Self Care | Payer: Medicare PPO | Source: Ambulatory Visit | Attending: Obstetrics and Gynecology | Admitting: Obstetrics and Gynecology

## 2020-06-25 DIAGNOSIS — Z1231 Encounter for screening mammogram for malignant neoplasm of breast: Secondary | ICD-10-CM | POA: Diagnosis not present

## 2020-06-29 DIAGNOSIS — L988 Other specified disorders of the skin and subcutaneous tissue: Secondary | ICD-10-CM | POA: Diagnosis not present

## 2020-06-29 DIAGNOSIS — Z6833 Body mass index (BMI) 33.0-33.9, adult: Secondary | ICD-10-CM | POA: Diagnosis not present

## 2020-06-29 DIAGNOSIS — M2550 Pain in unspecified joint: Secondary | ICD-10-CM | POA: Diagnosis not present

## 2020-06-29 DIAGNOSIS — E6609 Other obesity due to excess calories: Secondary | ICD-10-CM | POA: Diagnosis not present

## 2020-06-29 DIAGNOSIS — R252 Cramp and spasm: Secondary | ICD-10-CM | POA: Diagnosis not present

## 2020-06-29 DIAGNOSIS — M25512 Pain in left shoulder: Secondary | ICD-10-CM | POA: Diagnosis not present

## 2020-08-05 DIAGNOSIS — Z20822 Contact with and (suspected) exposure to covid-19: Secondary | ICD-10-CM | POA: Diagnosis not present

## 2020-08-13 DIAGNOSIS — I10 Essential (primary) hypertension: Secondary | ICD-10-CM | POA: Diagnosis not present

## 2020-08-13 DIAGNOSIS — E119 Type 2 diabetes mellitus without complications: Secondary | ICD-10-CM | POA: Diagnosis not present

## 2020-08-13 DIAGNOSIS — N200 Calculus of kidney: Secondary | ICD-10-CM | POA: Diagnosis not present

## 2020-08-13 DIAGNOSIS — Z6833 Body mass index (BMI) 33.0-33.9, adult: Secondary | ICD-10-CM | POA: Diagnosis not present

## 2020-08-13 DIAGNOSIS — M810 Age-related osteoporosis without current pathological fracture: Secondary | ICD-10-CM | POA: Diagnosis not present

## 2020-08-24 DIAGNOSIS — H40013 Open angle with borderline findings, low risk, bilateral: Secondary | ICD-10-CM | POA: Diagnosis not present

## 2020-08-24 DIAGNOSIS — H26492 Other secondary cataract, left eye: Secondary | ICD-10-CM | POA: Diagnosis not present

## 2020-09-15 DIAGNOSIS — Z833 Family history of diabetes mellitus: Secondary | ICD-10-CM | POA: Diagnosis not present

## 2020-09-15 DIAGNOSIS — Z823 Family history of stroke: Secondary | ICD-10-CM | POA: Diagnosis not present

## 2020-09-15 DIAGNOSIS — I10 Essential (primary) hypertension: Secondary | ICD-10-CM | POA: Diagnosis not present

## 2020-09-15 DIAGNOSIS — Z91040 Latex allergy status: Secondary | ICD-10-CM | POA: Diagnosis not present

## 2020-09-15 DIAGNOSIS — Z825 Family history of asthma and other chronic lower respiratory diseases: Secondary | ICD-10-CM | POA: Diagnosis not present

## 2020-09-15 DIAGNOSIS — E669 Obesity, unspecified: Secondary | ICD-10-CM | POA: Diagnosis not present

## 2020-09-15 DIAGNOSIS — F331 Major depressive disorder, recurrent, moderate: Secondary | ICD-10-CM | POA: Diagnosis not present

## 2020-09-15 DIAGNOSIS — Z91048 Other nonmedicinal substance allergy status: Secondary | ICD-10-CM | POA: Diagnosis not present

## 2020-09-15 DIAGNOSIS — G8929 Other chronic pain: Secondary | ICD-10-CM | POA: Diagnosis not present

## 2020-09-15 DIAGNOSIS — F419 Anxiety disorder, unspecified: Secondary | ICD-10-CM | POA: Diagnosis not present

## 2020-09-15 DIAGNOSIS — G4700 Insomnia, unspecified: Secondary | ICD-10-CM | POA: Diagnosis not present

## 2020-09-15 DIAGNOSIS — M8580 Other specified disorders of bone density and structure, unspecified site: Secondary | ICD-10-CM | POA: Diagnosis not present

## 2020-09-15 DIAGNOSIS — Z8249 Family history of ischemic heart disease and other diseases of the circulatory system: Secondary | ICD-10-CM | POA: Diagnosis not present

## 2020-09-15 DIAGNOSIS — E1142 Type 2 diabetes mellitus with diabetic polyneuropathy: Secondary | ICD-10-CM | POA: Diagnosis not present

## 2020-09-15 DIAGNOSIS — Z7984 Long term (current) use of oral hypoglycemic drugs: Secondary | ICD-10-CM | POA: Diagnosis not present

## 2020-09-15 DIAGNOSIS — Z888 Allergy status to other drugs, medicaments and biological substances status: Secondary | ICD-10-CM | POA: Diagnosis not present

## 2020-09-15 DIAGNOSIS — K219 Gastro-esophageal reflux disease without esophagitis: Secondary | ICD-10-CM | POA: Diagnosis not present

## 2020-09-15 DIAGNOSIS — K581 Irritable bowel syndrome with constipation: Secondary | ICD-10-CM | POA: Diagnosis not present

## 2020-09-15 DIAGNOSIS — T7840XD Allergy, unspecified, subsequent encounter: Secondary | ICD-10-CM | POA: Diagnosis not present

## 2020-09-15 DIAGNOSIS — R32 Unspecified urinary incontinence: Secondary | ICD-10-CM | POA: Diagnosis not present

## 2020-09-15 DIAGNOSIS — Z6832 Body mass index (BMI) 32.0-32.9, adult: Secondary | ICD-10-CM | POA: Diagnosis not present

## 2020-09-15 DIAGNOSIS — Z9181 History of falling: Secondary | ICD-10-CM | POA: Diagnosis not present

## 2020-10-03 DIAGNOSIS — N200 Calculus of kidney: Secondary | ICD-10-CM | POA: Diagnosis not present

## 2020-10-08 ENCOUNTER — Ambulatory Visit: Payer: Medicare PPO | Admitting: Obstetrics and Gynecology

## 2020-10-08 ENCOUNTER — Other Ambulatory Visit: Payer: Self-pay

## 2020-10-08 ENCOUNTER — Encounter: Payer: Self-pay | Admitting: Obstetrics and Gynecology

## 2020-10-08 ENCOUNTER — Telehealth: Payer: Self-pay | Admitting: Obstetrics and Gynecology

## 2020-10-08 ENCOUNTER — Telehealth: Payer: Self-pay

## 2020-10-08 VITALS — BP 140/70 | HR 76 | Ht 61.0 in | Wt 185.0 lb

## 2020-10-08 DIAGNOSIS — N6310 Unspecified lump in the right breast, unspecified quadrant: Secondary | ICD-10-CM | POA: Diagnosis not present

## 2020-10-08 NOTE — Telephone Encounter (Signed)
Patient scheduled while in office for right breast Dx MMG and [place], if needed, at [facility] on 11/02/20 at 10am, arrive at 9:40am. Patient declined earlier appt on 10/29/20, she will be out of town 10/31 -11/4. Patient is aware she can contact [facility] directly to see if earlier appts become available. Patient verbalizes understanding and is agreeable.   Placed in [place] hold.   Encounter closed.

## 2020-10-08 NOTE — Telephone Encounter (Signed)
Please schedule a dx right mammogram and right breast US at the [facility] for my patient.   She has a right breast lump at 9:00 at the edge of the areola, measuring 5 mm.

## 2020-10-08 NOTE — Telephone Encounter (Signed)
Patient found a lump in right breast.

## 2020-10-08 NOTE — Telephone Encounter (Signed)
Spoke with patient. Patient reports lump in right breast just to the side of the nipple and itching. Symptoms started last week. Reports occasional sharp pain. Denies fever/chills, nipple d/c or redness/swelling. Requesting OV. Received J&J Covid 19 vaccine on 04/03/20, immunizations updated in Epic. OV scheduled for today at 2:30pm with Dr. Quincy Simmonds. Patient is agreeable to date and time.   Last MMG 06/27/20, neg.  Last AEX 10/31/19  Encounter closed.

## 2020-10-08 NOTE — Progress Notes (Signed)
GYNECOLOGY  VISIT   HPI: 74 y.o.   Widowed  Caucasian  female   [id number] with Patient's last menstrual period was 12/29/1976 (approximate).   here for right breast lump near nipple. It does itch. She found this 3 days ago. Patient denies fever, redness or pain.  States she fell over a scale one month ago.   No insect bites.   No hormone treatment.   Hx right breast biopsy in the past.   No FH breast cancer.   GYNECOLOGIC HISTORY: Patient's last menstrual period was 12/29/1976 (approximate). Contraception:  Tubal/Hyst Menopausal hormone therapy:  no Last mammogram: 06-25-20  3D/Neg/density C/Birads1 Last pap smear: 2010 Neg        OB History    Gravida  2   Para  1   Term      Preterm      AB  1   Living  1     SAB  1   TAB      Ectopic      Multiple      Live Births                 Patient Active Problem List   Diagnosis Date Noted  . Renal calculus, right 03/15/2020  . OTHER DISEASES OF VOCAL CORDS 01/01/2009  . GERD 01/01/2009  . COUGH 01/01/2009    Past Medical History:  Diagnosis Date  . Anxiety    takes xanax  . Arthritis    fingers and neck  . Cataract immature   . Diabetes mellitus without complication ([place])   . Elevated hemoglobin A1c March 2016   level 6.2  . Fatty liver   . GERD (gastroesophageal reflux disease)   . History of bronchitis   . History of hiatal hernia   . History of kidney stones   . Hypertension   . IBS (irritable bowel syndrome)   . Imbalance   . Macular degeneration   . Melanoma ([place])    left eye  . Migraine   . Neuropathy   . Osteopenia 2016  . PONV (postoperative nausea and vomiting)    Benadryl help with N/V since she can no longer have SCOP  . Pre-diabetes   . Prediabetes 2019    Past Surgical History:  Procedure Laterality Date  . ABDOMINAL HYSTERECTOMY  1978   partial  . BILATERAL SALPINGOOPHORECTOMY  2010   with robot  . BREAST BIOPSY Right 03/06/2008   benign  . CATARACT EXTRACTION,  BILATERAL  2018  . CHOLECYSTECTOMY  2005   lap  . COLONOSCOPY    . CYSTOSCOPY N/A 03/13/2015   Procedure: CYSTOSCOPY;  Surgeon: Nunzio Cobbs, MD;  Location: [facility];  Service: Gynecology;  Laterality: N/A;  . IR NEPHRO TUBE REMOV/FL  03/20/2020  . IR NEPHROSTOGRAM RIGHT THRU EXISTING ACCESS  03/20/2020  . IR URETERAL STENT RIGHT NEW ACCESS W/O SEP NEPHROSTOMY CATH  03/15/2020  . LITHOTRIPSY     20 years ago  . NEPHROLITHOTOMY Right 03/15/2020   Procedure: NEPHROLITHOTOMY PERCUTANEOUS/ INTERVENTIONAL RADIOLOCGY TO PLACE POSTERIOR CALYX PERCUTANEOUS ACCESS PRIOR;  Surgeon: Raynelle Bring, MD;  Location: [facility];  Service: Urology;  Laterality: Right;  NEEDS 150 MIN FOR PROCEDURE  . ROBOTIC ASSISTED SALPINGO OOPHERECTOMY Left 03/13/2015   Procedure: ROBOTIC ASSISTED INCISION OF  LEFT PELVIC MASS, LYSIS OF ADHESIONS  WITH PELVIC WASHINGS;  Surgeon: Nunzio Cobbs, MD;  Location: [facility];  Service: Gynecology;  Laterality: Left;  . SHOULDER  SURGERY Left    bone spurs  . TONSILLECTOMY     as child  . TUBAL LIGATION  1976  . UPPER GI ENDOSCOPY      Current Outpatient Medications  Medication Sig Dispense Refill  . ACCU-CHEK GUIDE test strip     . Accu-Chek Softclix Lancets lancets     . ALPRAZolam (XANAX) 1 MG tablet Take 1.5 mg by mouth See admin instructions. Take 1.5 mg at night, may take a 0.5 mg dose as needed for anxiety    . Blood Glucose Monitoring Suppl (ACCU-CHEK GUIDE) w/Device KIT     . Cholecalciferol (VITAMIN D3) 5000 UNITS CAPS Take 5,000 Units by mouth daily.     . diphenhydrAMINE (BENADRYL) 25 MG tablet Take 25 mg by mouth at bedtime.     . docusate sodium (COLACE) 100 MG capsule Take 100 mg by mouth daily as needed for mild constipation.    . DULoxetine (CYMBALTA) 60 MG capsule Take 60 mg by mouth at bedtime.    . famotidine (PEPCID) 20 MG tablet Take 20 mg by mouth daily as needed for heartburn or indigestion.     Marland Kitchen glipiZIDE (GLUCOTROL) 10 MG tablet Take 10  mg by mouth daily before breakfast.    . hydrochlorothiazide (HYDRODIURIL) 25 MG tablet Take 25 mg by mouth See admin instructions. Take 25 mg in the morning, may take a second 25 mg at lunch if skipped linzess dose    . JANUVIA 100 MG tablet Take 1 tablet by mouth daily.    Marland Kitchen LINZESS 290 MCG CAPS capsule Take 290 mcg by mouth daily.     Marland Kitchen loratadine (CLARITIN) 10 MG tablet Take 10 mg by mouth daily.    . montelukast (SINGULAIR) 10 MG tablet Take 10 mg by mouth at bedtime.    . mupirocin ointment (BACTROBAN) 2 %     . Polyethyl Glycol-Propyl Glycol (SYSTANE ULTRA PF OP) Place 1 drop into both eyes at bedtime.    . Simethicone (GAS-X PO) Take 1 tablet by mouth daily as needed (gas).    . traMADol (ULTRAM) 50 MG tablet Take 1-2 tablets (50-100 mg total) by mouth every 6 (six) hours as needed (pain). 15 tablet 0   No current facility-administered medications for this visit.     ALLERGIES: Bystolic [nebivolol hcl], Latex, Lexapro [escitalopram], Oxycodone, Prednisone, Adhesive [tape], Aspirin, Chlorhexidine, Codeine, Elavil [amitriptyline hcl], Hydrocodone, Metformin and related, Other, Tramadol, Cephalosporins, Ciprofloxacin, and Penicillins  Family History  Adopted: Yes  Problem Relation Age of Onset  . Hypertension Mother   . Thyroid disease Mother   . Diabetes Maternal Grandmother   . Hypertension Maternal Grandmother   . Heart attack Maternal Grandmother   . Hypertension Maternal Grandfather   . Stroke Maternal Grandfather   . Stroke Maternal Uncle   . Stroke Maternal Uncle   . Heart attack Maternal Uncle   . Heart attack Maternal Uncle   . Lung cancer Maternal Uncle     Social History   Socioeconomic History  . Marital status: Widowed    Spouse name: Not on file  . Number of children: Not on file  . Years of education: Not on file  . Highest education level: Not on file  Occupational History  . Not on file  Tobacco Use  . Smoking status: Never Smoker  . Smokeless  tobacco: Never Used  Vaping Use  . Vaping Use: Never used  Substance and Sexual Activity  . Alcohol use: Yes    Alcohol/week:  1.0 standard drink    Types: 1 Standard drinks or equivalent per week    Comment: occassionally  . Drug use: No  . Sexual activity: Not Currently    Partners: Male    Birth control/protection: Surgical    Comment: TAH 1978/BSO 2010  Other Topics Concern  . Not on file  Social History Narrative  . Not on file   Social Determinants of Health   Financial Resource Strain:   . Difficulty of Paying Living Expenses: Not on file  Food Insecurity:   . Worried About Charity fundraiser in the Last Year: Not on file  . Ran Out of Food in the Last Year: Not on file  Transportation Needs:   . Lack of Transportation (Medical): Not on file  . Lack of Transportation (Non-Medical): Not on file  Physical Activity:   . Days of Exercise per Week: Not on file  . Minutes of Exercise per Session: Not on file  Stress:   . Feeling of Stress : Not on file  Social Connections:   . Frequency of Communication with Friends and Family: Not on file  . Frequency of Social Gatherings with Friends and Family: Not on file  . Attends Religious Services: Not on file  . Active Member of Clubs or Organizations: Not on file  . Attends Archivist Meetings: Not on file  . Marital Status: Not on file  Intimate Partner Violence:   . Fear of Current or Ex-Partner: Not on file  . Emotionally Abused: Not on file  . Physically Abused: Not on file  . Sexually Abused: Not on file    Review of Systems  All other systems reviewed and are negative.   PHYSICAL EXAMINATION:    BP 140/70 (Cuff Size: Large)   Pulse 76   Ht _0  (1.549 m)   Wt 185 lb (83.9 kg)   LMP 12/29/1976 (Approximate)   BMI 34.96 kg/m     General appearance: alert, cooperative and appears stated age   Breasts: right - normal appearance, 5 mm mass at 9:00 at edge of areola, No nipple retraction or dimpling,  No nipple discharge or bleeding, No axillary adenopathy Left - normal appearance, no masses or tenderness, No nipple retraction or dimpling, No nipple discharge or bleeding, No axillary adenopathy  Chaperone was present for exam.  ASSESSMENT  Right breast mass. Hx benign right breast biopsy.  PLAN  Proceed with dx right mammogram and ultrasound.

## 2020-10-25 ENCOUNTER — Ambulatory Visit
Admission: RE | Admit: 2020-10-25 | Discharge: 2020-10-25 | Disposition: A | Discharge status: Home / Self Care | Payer: Medicare PPO | Source: Ambulatory Visit | Attending: Obstetrics and Gynecology | Admitting: Obstetrics and Gynecology

## 2020-10-25 ENCOUNTER — Other Ambulatory Visit: Payer: Self-pay

## 2020-10-25 DIAGNOSIS — N6310 Unspecified lump in the right breast, unspecified quadrant: Secondary | ICD-10-CM

## 2020-10-25 DIAGNOSIS — N6041 Mammary duct ectasia of right breast: Secondary | ICD-10-CM | POA: Diagnosis not present

## 2020-10-25 DIAGNOSIS — R922 Inconclusive mammogram: Secondary | ICD-10-CM | POA: Diagnosis not present

## 2020-10-26 ENCOUNTER — Other Ambulatory Visit: Payer: Self-pay | Admitting: Urology

## 2020-10-26 ENCOUNTER — Other Ambulatory Visit: Payer: Self-pay

## 2020-10-26 ENCOUNTER — Other Ambulatory Visit (HOSPITAL_COMMUNITY)
Admission: RE | Admit: 2020-10-26 | Discharge: 2020-10-26 | Disposition: A | Discharge status: Home / Self Care | Payer: Medicare PPO | Source: Ambulatory Visit | Attending: Urology | Admitting: Urology

## 2020-10-26 ENCOUNTER — Encounter (HOSPITAL_COMMUNITY): Payer: Self-pay | Admitting: Urology

## 2020-10-26 DIAGNOSIS — N201 Calculus of ureter: Secondary | ICD-10-CM | POA: Diagnosis not present

## 2020-10-26 DIAGNOSIS — R310 Gross hematuria: Secondary | ICD-10-CM | POA: Diagnosis not present

## 2020-10-26 DIAGNOSIS — Z87442 Personal history of urinary calculi: Secondary | ICD-10-CM | POA: Diagnosis not present

## 2020-10-26 DIAGNOSIS — Z01812 Encounter for preprocedural laboratory examination: Secondary | ICD-10-CM | POA: Diagnosis not present

## 2020-10-26 DIAGNOSIS — K589 Irritable bowel syndrome without diarrhea: Secondary | ICD-10-CM | POA: Diagnosis not present

## 2020-10-26 DIAGNOSIS — Z20822 Contact with and (suspected) exposure to covid-19: Secondary | ICD-10-CM | POA: Insufficient documentation

## 2020-10-26 DIAGNOSIS — N132 Hydronephrosis with renal and ureteral calculous obstruction: Secondary | ICD-10-CM | POA: Diagnosis not present

## 2020-10-26 NOTE — Progress Notes (Signed)
COVID Vaccine Completed: Yes Date COVID Vaccine completed: COVID vaccine manufacturer: [place]   PCP - Sharilyn Sites, MD Cardiologist -   Chest x-ray -  EKG - 03-09-20 Stress Test -  ECHO -  Cardiac Cath -  Pacemaker/ICD device last checked:  Sleep Study -  CPAP -   Fasting Blood Sugar - 150-180 Checks Blood Sugar 2-3 times a week   Blood Thinner Instructions: Aspirin Instructions: Last Dose:  Anesthesia review:   Patient denies shortness of breath, fever, cough and chest pain at PAT appointment   Patient verbalized understanding of instructions that were given to them at the PAT appointment. Patient was also instructed that they will need to review over the PAT instructions again at home before surgery.

## 2020-10-27 LAB — SARS CORONAVIRUS 2 (TAT 6-24 HRS): SARS Coronavirus 2: NEGATIVE

## 2020-10-28 MED ORDER — GENTAMICIN SULFATE 40 MG/ML IJ SOLN
5.0000 mg/kg | INTRAVENOUS | Status: AC
  Administered 2020-10-29: 290 mg via INTRAVENOUS
  Filled 2020-10-28: qty 7.25

## 2020-10-29 ENCOUNTER — Encounter (HOSPITAL_COMMUNITY): Payer: Self-pay | Admitting: Urology

## 2020-10-29 ENCOUNTER — Ambulatory Visit (HOSPITAL_COMMUNITY): Payer: Medicare PPO | Admitting: Certified Registered Nurse Anesthetist

## 2020-10-29 ENCOUNTER — Encounter (HOSPITAL_COMMUNITY)
Admission: RE | Disposition: A | Discharge status: Home / Self Care | Payer: Self-pay | Source: Home / Self Care | Attending: Urology

## 2020-10-29 ENCOUNTER — Ambulatory Visit (HOSPITAL_COMMUNITY)
Admission: RE | Admit: 2020-10-29 | Discharge: 2020-10-29 | Disposition: A | Discharge status: Home / Self Care | Payer: Medicare PPO | Attending: Urology | Admitting: Urology

## 2020-10-29 ENCOUNTER — Ambulatory Visit (HOSPITAL_COMMUNITY): Payer: Medicare PPO

## 2020-10-29 ENCOUNTER — Other Ambulatory Visit: Payer: Self-pay

## 2020-10-29 DIAGNOSIS — Z8249 Family history of ischemic heart disease and other diseases of the circulatory system: Secondary | ICD-10-CM | POA: Insufficient documentation

## 2020-10-29 DIAGNOSIS — Z90710 Acquired absence of both cervix and uterus: Secondary | ICD-10-CM | POA: Diagnosis not present

## 2020-10-29 DIAGNOSIS — R310 Gross hematuria: Secondary | ICD-10-CM | POA: Insufficient documentation

## 2020-10-29 DIAGNOSIS — Z9049 Acquired absence of other specified parts of digestive tract: Secondary | ICD-10-CM | POA: Insufficient documentation

## 2020-10-29 DIAGNOSIS — N201 Calculus of ureter: Secondary | ICD-10-CM | POA: Diagnosis not present

## 2020-10-29 DIAGNOSIS — Z825 Family history of asthma and other chronic lower respiratory diseases: Secondary | ICD-10-CM | POA: Insufficient documentation

## 2020-10-29 DIAGNOSIS — K219 Gastro-esophageal reflux disease without esophagitis: Secondary | ICD-10-CM | POA: Diagnosis not present

## 2020-10-29 DIAGNOSIS — Z882 Allergy status to sulfonamides status: Secondary | ICD-10-CM | POA: Diagnosis not present

## 2020-10-29 DIAGNOSIS — Z79899 Other long term (current) drug therapy: Secondary | ICD-10-CM | POA: Insufficient documentation

## 2020-10-29 DIAGNOSIS — I10 Essential (primary) hypertension: Secondary | ICD-10-CM | POA: Insufficient documentation

## 2020-10-29 DIAGNOSIS — Z880 Allergy status to penicillin: Secondary | ICD-10-CM | POA: Diagnosis not present

## 2020-10-29 DIAGNOSIS — Z7984 Long term (current) use of oral hypoglycemic drugs: Secondary | ICD-10-CM | POA: Diagnosis not present

## 2020-10-29 DIAGNOSIS — E119 Type 2 diabetes mellitus without complications: Secondary | ICD-10-CM | POA: Diagnosis not present

## 2020-10-29 DIAGNOSIS — Z888 Allergy status to other drugs, medicaments and biological substances status: Secondary | ICD-10-CM | POA: Insufficient documentation

## 2020-10-29 DIAGNOSIS — Z881 Allergy status to other antibiotic agents status: Secondary | ICD-10-CM | POA: Diagnosis not present

## 2020-10-29 DIAGNOSIS — Z823 Family history of stroke: Secondary | ICD-10-CM | POA: Insufficient documentation

## 2020-10-29 DIAGNOSIS — Z87442 Personal history of urinary calculi: Secondary | ICD-10-CM | POA: Diagnosis not present

## 2020-10-29 DIAGNOSIS — Z91040 Latex allergy status: Secondary | ICD-10-CM | POA: Insufficient documentation

## 2020-10-29 DIAGNOSIS — Z833 Family history of diabetes mellitus: Secondary | ICD-10-CM | POA: Insufficient documentation

## 2020-10-29 HISTORY — PX: CYSTOSCOPY/URETEROSCOPY/HOLMIUM LASER/STENT PLACEMENT: SHX6546

## 2020-10-29 LAB — BASIC METABOLIC PANEL
Anion gap: 18 — ABNORMAL HIGH (ref 5–15)
BUN: 24 mg/dL — ABNORMAL HIGH (ref 8–23)
CO2: 24 mmol/L (ref 22–32)
Calcium: 9.7 mg/dL (ref 8.9–10.3)
Chloride: 101 mmol/L (ref 98–111)
Creatinine, Ser: 1.19 mg/dL — ABNORMAL HIGH (ref 0.44–1.00)
GFR, Estimated: 48 mL/min — ABNORMAL LOW (ref >60)
Glucose, Bld: 145 mg/dL — ABNORMAL HIGH (ref 70–99)
Potassium: 3.4 mmol/L — ABNORMAL LOW (ref 3.5–5.1)
Sodium: 143 mmol/L (ref 135–145)

## 2020-10-29 LAB — CBC
HCT: 39.2 % (ref 36.0–46.0)
Hemoglobin: 13.3 g/dL (ref 12.0–15.0)
MCH: 30.6 pg (ref 26.0–34.0)
MCHC: 33.9 g/dL (ref 30.0–36.0)
MCV: 90.3 fL (ref 80.0–100.0)
Platelets: 280 10*3/uL (ref 150–400)
RBC: 4.34 MIL/uL (ref 3.87–5.11)
RDW: 12.8 % (ref 11.5–15.5)
WBC: 9.1 10*3/uL (ref 4.0–10.5)
nRBC: 0 % (ref 0.0–0.2)

## 2020-10-29 LAB — GLUCOSE, CAPILLARY: Glucose-Capillary: 143 mg/dL — ABNORMAL HIGH (ref 70–99)

## 2020-10-29 LAB — HEMOGLOBIN A1C
Hgb A1c MFr Bld: 7.7 % — ABNORMAL HIGH (ref 4.8–5.6)
Mean Plasma Glucose: 174.29 mg/dL

## 2020-10-29 SURGERY — CYSTOSCOPY/URETEROSCOPY/HOLMIUM LASER/STENT PLACEMENT
Anesthesia: General | Laterality: Left

## 2020-10-29 MED ORDER — LIDOCAINE 2% (20 MG/ML) 5 ML SYRINGE
INTRAMUSCULAR | Status: AC
  Filled 2020-10-29: qty 5

## 2020-10-29 MED ORDER — ONDANSETRON HCL 4 MG/2ML IJ SOLN
INTRAMUSCULAR | Status: DC | PRN
  Administered 2020-10-29: 4 mg via INTRAVENOUS

## 2020-10-29 MED ORDER — FENTANYL CITRATE (PF) 100 MCG/2ML IJ SOLN
INTRAMUSCULAR | Status: DC | PRN
  Administered 2020-10-29 (×2): 50 ug via INTRAVENOUS

## 2020-10-29 MED ORDER — ROCURONIUM BROMIDE 10 MG/ML (PF) SYRINGE
PREFILLED_SYRINGE | INTRAVENOUS | Status: AC
  Filled 2020-10-29: qty 10

## 2020-10-29 MED ORDER — EPHEDRINE SULFATE-NACL 50-0.9 MG/10ML-% IV SOSY
PREFILLED_SYRINGE | INTRAVENOUS | Status: DC | PRN
  Administered 2020-10-29 (×2): 10 mg via INTRAVENOUS

## 2020-10-29 MED ORDER — LACTATED RINGERS IV SOLN: INTRAVENOUS | Status: DC

## 2020-10-29 MED ORDER — FENTANYL CITRATE (PF) 100 MCG/2ML IJ SOLN
INTRAMUSCULAR | Status: AC
  Filled 2020-10-29: qty 2

## 2020-10-29 MED ORDER — SUGAMMADEX SODIUM 200 MG/2ML IV SOLN
INTRAVENOUS | Status: DC | PRN
  Administered 2020-10-29 (×2): 200 mg via INTRAVENOUS

## 2020-10-29 MED ORDER — HYDROMORPHONE HCL 1 MG/ML IJ SOLN: 0.2500 mg | INTRAMUSCULAR | Status: DC | PRN

## 2020-10-29 MED ORDER — ALBUTEROL SULFATE HFA 108 (90 BASE) MCG/ACT IN AERS
INHALATION_SPRAY | RESPIRATORY_TRACT | Status: DC | PRN
  Administered 2020-10-29 (×2): 2 via RESPIRATORY_TRACT

## 2020-10-29 MED ORDER — ROCURONIUM BROMIDE 10 MG/ML (PF) SYRINGE
PREFILLED_SYRINGE | INTRAVENOUS | Status: DC | PRN
  Administered 2020-10-29: 50 mg via INTRAVENOUS

## 2020-10-29 MED ORDER — SCOPOLAMINE 1 MG/3DAYS TD PT72: 1.0000 | MEDICATED_PATCH | TRANSDERMAL | Status: DC

## 2020-10-29 MED ORDER — DEXAMETHASONE SODIUM PHOSPHATE 10 MG/ML IJ SOLN
INTRAMUSCULAR | Status: DC | PRN
  Administered 2020-10-29: 10 mg via INTRAVENOUS

## 2020-10-29 MED ORDER — CHLORHEXIDINE GLUCONATE 0.12 % MT SOLN
15.0000 mL | Freq: Once | OROMUCOSAL | Status: AC
  Administered 2020-10-29: 15 mL via OROMUCOSAL

## 2020-10-29 MED ORDER — PROPOFOL 10 MG/ML IV BOLUS
INTRAVENOUS | Status: DC | PRN
  Administered 2020-10-29 (×2): 100 mg via INTRAVENOUS

## 2020-10-29 MED ORDER — SCOPOLAMINE 1 MG/3DAYS TD PT72
MEDICATED_PATCH | TRANSDERMAL | Status: AC
  Administered 2020-10-29: 1.5 mg via TRANSDERMAL
  Filled 2020-10-29: qty 1

## 2020-10-29 MED ORDER — MEPERIDINE HCL 50 MG/ML IJ SOLN: 6.2500 mg | INTRAMUSCULAR | Status: DC | PRN

## 2020-10-29 MED ORDER — SODIUM CHLORIDE 0.9 % IR SOLN
Status: DC | PRN
  Administered 2020-10-29: 3000 mL

## 2020-10-29 MED ORDER — PROMETHAZINE HCL 25 MG/ML IJ SOLN: 6.2500 mg | INTRAMUSCULAR | Status: DC | PRN

## 2020-10-29 MED ORDER — PROPOFOL 500 MG/50ML IV EMUL
INTRAVENOUS | Status: AC
  Filled 2020-10-29: qty 50

## 2020-10-29 MED ORDER — ORAL CARE MOUTH RINSE: 15.0000 mL | Freq: Once | OROMUCOSAL | Status: AC

## 2020-10-29 MED ORDER — PROPOFOL 10 MG/ML IV BOLUS
INTRAVENOUS | Status: AC
  Filled 2020-10-29: qty 40

## 2020-10-29 MED ORDER — DEXAMETHASONE SODIUM PHOSPHATE 10 MG/ML IJ SOLN
INTRAMUSCULAR | Status: AC
  Filled 2020-10-29: qty 1

## 2020-10-29 MED ORDER — LIDOCAINE 2% (20 MG/ML) 5 ML SYRINGE
INTRAMUSCULAR | Status: DC | PRN
  Administered 2020-10-29: 60 mg via INTRAVENOUS

## 2020-10-29 MED ORDER — ACETAMINOPHEN 500 MG PO TABS
1000.0000 mg | ORAL_TABLET | Freq: Once | ORAL | Status: AC
  Administered 2020-10-29: 1000 mg via ORAL
  Filled 2020-10-29: qty 2

## 2020-10-29 MED ORDER — IOHEXOL 300 MG/ML  SOLN
INTRAMUSCULAR | Status: DC | PRN
  Administered 2020-10-29: 13 mL

## 2020-10-29 MED ORDER — ONDANSETRON HCL 4 MG/2ML IJ SOLN
INTRAMUSCULAR | Status: AC
  Filled 2020-10-29: qty 2

## 2020-10-29 MED ORDER — MIDAZOLAM HCL 2 MG/2ML IJ SOLN: 0.5000 mg | Freq: Once | INTRAMUSCULAR | Status: DC | PRN

## 2020-10-29 SURGICAL SUPPLY — 20 items
BAG URO CATCHER STRL LF (MISCELLANEOUS) ×3 IMPLANT
BASKET ZERO TIP NITINOL 2.4FR (BASKET) ×2 IMPLANT
BSKT STON RTRVL ZERO TP 2.4FR (BASKET) ×1
CATH INTERMIT  6FR 70CM (CATHETERS) ×2 IMPLANT
CLOTH BEACON ORANGE TIMEOUT ST (SAFETY) ×3 IMPLANT
GLOVE BIOGEL M STRL SZ7.5 (GLOVE) ×3 IMPLANT
GOWN STRL REUS W/TWL LRG LVL3 (GOWN DISPOSABLE) ×3 IMPLANT
GUIDEWIRE STR DUAL SENSOR (WIRE) ×3 IMPLANT
GUIDEWIRE ZIPWRE .038 STRAIGHT (WIRE) IMPLANT
KIT TURNOVER KIT A (KITS) ×2 IMPLANT
LASER FIB FLEXIVA PULSE ID 365 (Laser) ×2 IMPLANT
MANIFOLD NEPTUNE II (INSTRUMENTS) ×3 IMPLANT
PACK CYSTO (CUSTOM PROCEDURE TRAY) ×3 IMPLANT
SHEATH URETERAL 12FRX35CM (MISCELLANEOUS) IMPLANT
STENT URET 6FRX24 CONTOUR (STENTS) ×2 IMPLANT
TRACTIP FLEXIVA PULS ID 200XHI (Laser) IMPLANT
TRACTIP FLEXIVA PULSE ID 200 (Laser)
TUBING CONNECTING 10 (TUBING) ×2 IMPLANT
TUBING CONNECTING 10' (TUBING) ×1
TUBING UROLOGY SET (TUBING) ×3 IMPLANT

## 2020-10-29 NOTE — H&P (Signed)
Office Visit Report     10/26/2020   --------------------------------------------------------------------------------   Victoria Andrade. Sze  MRN: [id number]  DOB: [birth year], 74 year old Female  SSN: [id number]   PRIMARY CARE:  Sharilyn Sites, MD  REFERRING:  Sharilyn Sites, MD  PROVIDER:  Raynelle Bring, M.D.  TREATING:  Daine Gravel, NP  LOCATION:  [facility] [phone]     --------------------------------------------------------------------------------   CC/HPI: Urolithiasis   She returns today for routine follow-up a little over 6 months out from her right PCNL. She has had some occasional twinges on the right side of her back but no severe pain symptoms, hematuria, or passage of any stones. She has denied left-sided flank pain. She does have remaining known moderate burden left renal calculi. She follows up today with a KUB x-ray. She did complete a 24 hour urine after her last visit which demonstrated low urine volumes, high urine oxalate levels, and low urine citrate levels. She has been unable to tolerate any citrate replacement medications. As such, we have been working on a low oxalate and high citrate diet and improving her hydration. Today, she states that she has been only mildly successful. She admits she has had some difficulty due to also trying to modify her diet due to her diabetes.   10/26/20: Today, she reports increased difficulty with urination, gross hematuria, and severe left-sided flank discomfort. She denies passage of stone materials however, she did pass a small clot and thought this could be a stone fragments. She is currently not having trouble voiding. She denies fevers, chills. She does have some slight nausea. Unfortunately, she was in the clinic on 10/25/20, but we were unable to get imaging at this time and she opted to return this morning for CT scan and blood work.     ALLERGIES: Adhesive tape Ciprofloxacin HCl TABS - Skin Rash Keflex TABS -  Skin Rash Latex - Swelling, Trouble Breathing Penicillins - Skin Rash PredniSONE TABS Sulfa Drugs - Skin Rash Tamsulosin HCl CAPS - Other Reaction, hair loss    MEDICATIONS: Amerge 2.5 mg tablet Oral  Benadryl TABS Oral  Claritin TABS Oral  Duloxetine Hcl  Hydrochlorothiazide 25 mg tablet 1 Oral  Januvia  Linzess 145 mcg capsule 1 capsule PO Daily  Miralax  Montelukast Sodium 10 mg tablet Oral  Stool Softener  Vitamin D-3 5000 UNIT TABS Oral  Xanax 1 mg tablet Oral     GU PSH: ESWL - 2017, 2016, 2013, 2013 Hysterectomy Unilat SO - 2013 Percut Stone Removal >2cm, Right - 03/15/2020     NON-GU PSH: Cataract surgery, Bilateral Cataract Surgery.., Bilateral Cholecystectomy (laparoscopic) Remove Tonsils - 2008 Shoulder Joint Surgery, Left - 2018 Tubal Ligation - 2008     GU PMH: Renal calculus - 10/03/2020, Nephrolithiasis, - 2017 Flank Pain (Worsening, Chronic), Right, Culture urine. No ABX unless culture proven UTI. Reassured no acute finding today today explain flank pain. Recommend she alternate Tylenol/NSAID and heat/ice for pain. If pain persist or worsens may need CT urogram. - 2019 Unspecified condition associated with female genital organs and menstrual cycle, Adnexal mass - 2016 Other microscopic hematuria, Microscopic hematuria - 2015 Ureteral calculus, Calculus of ureter - 2014      PMH Notes:   1) Urolithiasis: She has a history of calcium oxalate urolithiasis. She has a history of low urine volume and hyperoxaluria.   Current treatment: Fluid hydration, calcium citrate, low oxalate diet, high citrate diet  Prior treatment: HCTZ (unable to tolerate)  1995: ESWL  Apr 2013: ESWL 8 mm left ureteral stone  June 2013: ESWL of multiple left renal calculi  Aug 2013: 24 hr urine - low urine volume, hyperoxaluria  Feb 2014: 24 hr urine - Stone risk significantly reduced  Apr 2015: 24 hr urine - Calcium and oxalate increased  Dec 2015: 24 hr urine -  Hypercalciuria and high urine sodium - increased HCTZ to 25 mg q am and 12.5 mg q pm, reduce dietary sodium  Jul 2016: 24 hr urine - Hyeroxaluria and hypocitraturia - not compliant with calcium treatment -- plan to increase calcium supplement compliance  Dec 2016: ESWL of right renal calculus  Mar 2017: ESWL of left renal calculus  Mar 2021: R PCNL  May 2021: 24 hr urine - Low urine volume, hyperoxaluria, hypocitraturia,     NON-GU PMH: Anxiety, Anxiety - 2014 Irritable bowel syndrome with diarrhea, Irritable Bowel Syndrome - 2014 GERD Hypertension    FAMILY HISTORY: Acute Myocardial Infarction - Mother, Runs In Family Chronic Obstructive Pulmonary Disease - Mother Diabetes - Runs In Family Ischemic Stroke - Mother   SOCIAL HISTORY: Marital Status: Married Preferred Language: English; Ethnicity: Not Hispanic Or Latino; Race: White Current Smoking Status: Patient has never smoked.  Does not use smokeless tobacco. Types of alcohol consumed: Wine. Social Drinker.  Does not use drugs. Does not drink caffeine. Has not had a blood transfusion.    REVIEW OF SYSTEMS:    GU Review Female:   Patient reports burning /pain with urination. Patient denies frequent urination, hard to postpone urination, get up at night to urinate, leakage of urine, stream starts and stops, trouble starting your stream, have to strain to urinate, and being pregnant.  Gastrointestinal (Upper):   Patient reports nausea. Patient denies vomiting and indigestion/ heartburn.  Gastrointestinal (Lower):   Patient denies diarrhea and constipation.  Constitutional:   Patient denies weight loss, fatigue, night sweats, and fever.  Skin:   Patient denies skin rash/ lesion and itching.  Eyes:   Patient denies blurred vision and double vision.  Ears/ Nose/ Throat:   Patient denies sore throat and sinus problems.  Hematologic/Lymphatic:   Patient denies swollen glands and easy bruising.  Cardiovascular:   Patient denies leg  swelling and chest pains.  Respiratory:   Patient denies cough and shortness of breath.  Endocrine:   Patient denies excessive thirst.  Musculoskeletal:   Patient denies back pain and joint pain.  Neurological:   Patient denies headaches and dizziness.  Psychologic:   Patient denies depression and anxiety.   Notes: Possible kidney stone    VITAL SIGNS: None  Notes: Vital signs from 10/26/20:  weight is 182lbs  Pulse 92   Temp 97.6   GU PHYSICAL EXAMINATION:      Notes: Left flank tenderness   MULTI-SYSTEM PHYSICAL EXAMINATION:    Constitutional: Well-nourished. No physical deformities. Normally developed. Good grooming.  Respiratory: No labored breathing, no use of accessory muscles.   Cardiovascular: Normal temperature, normal extremity pulses, no swelling, no varicosities.  Skin: No paleness, no jaundice, no cyanosis. No lesion, no ulcer, no rash.  Neurologic / Psychiatric: Oriented to time, oriented to place, oriented to person. No depression, no anxiety, no agitation.  Musculoskeletal: Normal gait and station of head and neck.     Complexity of Data:  Source Of History:  Patient, Medical Record Summary  Records Review:   Previous Doctor Records, Previous Hospital Records, Previous Patient Records  Urine Test Review:   Urinalysis, Urine Culture  X-Ray Review: C.T. Abdomen/Pelvis: Reviewed Films. Reviewed Report. Discussed With Patient.     10/25/20  Urinalysis  Urine Appearance Cloudy   Urine Color Yellow   Urine Glucose Neg mg/dL  Urine Bilirubin Neg mg/dL  Urine Ketones Neg mg/dL  Urine Specific Gravity 1.020   Urine Blood 3+ ery/uL  Urine pH 5.5   Urine Protein 1+ mg/dL  Urine Urobilinogen 0.2 mg/dL  Urine Nitrites Neg   Urine Leukocyte Esterase 2+ leu/uL  Urine WBC/hpf 6 - 10/hpf   Urine RBC/hpf >60/hpf   Urine Epithelial Cells 0 - 5/hpf   Urine Bacteria Rare (0-9/hpf)   Urine Mucous Not Present   Urine Yeast NS (Not Seen)   Urine Trichomonas Not Present    Urine Cystals NS (Not Seen)   Urine Casts NS (Not Seen)   Urine Sperm Not Present    PROCEDURES:         C.T. Urogram - [id number]      Patient confirmed No Neulasta OnPro Device.        PVR Ultrasound - [id number]  Scanned Volume: 54 cc   ASSESSMENT:      ICD-10 Details  1 GU:   Ureteral calculus - N20.1 Left, Acute, Systemic Symptoms   PLAN:            Medications New Meds: Ondansetron Hcl 4 mg tablet 1 tablet PO Q 6 H PRN   #30  0 Refill(s)  Oxycodone-Acetaminophen 5 mg-325 mg tablet 1 tablet PO Q 4 H PRN for severe pain  #30  0 Refill(s)            Orders Labs BUN/Creatinine(Stat)  X-Rays: C.T. Stone Protocol Without Contrast - Gross hematuria  X-Ray Notes: History:  Hematuria: Yes/No  Patient to see MD after exam: Yes/No  Previous exam: CT / IVP/ US/ KUB/ None  When:  Where:  Diabetic: Yes/ No  BUN/ Creatine:  Date of last BUN Creatinine:  Weight in pounds:  Allergy- Contrasts/ Shellfish: Yes/ No  Conflicting diabetic meds: Yes/ No  Oral contrast and instructions given to patient:   Prior Authorization #: [id number] valid 10/26/20 thru 11/25/20            Schedule Labs: Today 10/25/2020 - CULTURE, URINE  Return Visit/Planned Activity: ASAP - Schedule Surgery          Document Letter(s):  Created for Patient: Clinical Summary         Notes:   Ct scan shows concern for multiple stones including left sided obstruction. I discussed this with her urologist who suggested she undergoes URS urgently, she was given the option of emergent Stent placement today, but decided against this. URS is set up for Monday morning November 1. Renal function labs drawn today. Medications for pain, nausea sent to pharmacy. She understands that if she develops any fevers, chills, worsening pain or symptoms she will need to proceed to the ED over the weekend. She verbalized understanding.         Next Appointment:      Next Appointment: 10/29/2020 02:00 PM    Appointment  Type: Surgery     Location: [facility] [phone]    Provider: Raynelle Bring, M.D.    Reason for Visit: WL/OP CYSTO, WITH LEFT URS HLL LT RPG, STENT      * Signed by Daine Gravel, NP on 10/27/20 at 1:00 PM (EDT)*

## 2020-10-29 NOTE — Anesthesia Preprocedure Evaluation (Addendum)
Anesthesia Evaluation  Patient identified by MRN, date of birth, ID band Patient awake    Reviewed: Allergy & Precautions, NPO status , Patient's Chart, lab work & pertinent test results  History of Anesthesia Complications (+) PONV  Airway Mallampati: II  TM Distance: >3 FB Neck ROM: Full    Dental  (+) Dental Advisory Given   Pulmonary COPD,  COPD inhaler,  10/26/2020 SARS coronavirus NEG   breath sounds clear to auscultation       Cardiovascular hypertension, Pt. on medications (-) angina Rhythm:Regular Rate:Normal     Neuro/Psych  Headaches, Anxiety    GI/Hepatic Neg liver ROS, GERD  Medicated and Poorly Controlled,  Endo/Other  diabetes (glu 145), Oral Hypoglycemic Agentsobese  Renal/GU stones     Musculoskeletal   Abdominal (+) + obese,   Peds  Hematology negative hematology ROS (+)   Anesthesia Other Findings   Reproductive/Obstetrics                            Anesthesia Physical Anesthesia Plan  ASA: III  Anesthesia Plan: General   Post-op Pain Management:    Induction: Intravenous  PONV Risk Score and Plan: 3 and Ondansetron, Dexamethasone and Scopolamine patch - Pre-op  Airway Management Planned: Oral ETT  Additional Equipment:   Intra-op Plan:   Post-operative Plan: Extubation in OR  Informed Consent: I have reviewed the patients History and Physical, chart, labs and discussed the procedure including the risks, benefits and alternatives for the proposed anesthesia with the patient or authorized representative who has indicated his/her understanding and acceptance.     Dental advisory given  Plan Discussed with: CRNA and Surgeon  Anesthesia Plan Comments:         Anesthesia Quick Evaluation

## 2020-10-29 NOTE — Interval H&P Note (Signed)
History and Physical Interval Note:  10/29/2020 1:13 PM  Victoria Andrade  has presented today for surgery, with the diagnosis of LEFT PROXIMAL CALCULI.  The various methods of treatment have been discussed with the patient and family. After consideration of risks, benefits and other options for treatment, the patient has consented to  Procedure(s): CYSTOSCOPY/RETROGRADE/URETEROSCOPY/HOLMIUM LASER/STENT PLACEMENT (Left) as a surgical intervention.  The patient's history has been reviewed, patient examined, no change in status, stable for surgery.  I have reviewed the patient's chart and labs.  Questions were answered to the patient's satisfaction.     [employer]

## 2020-10-29 NOTE — Anesthesia Procedure Notes (Signed)
Procedure Name: Intubation Date/Time: 10/29/2020 3:28 PM Performed by: Gerald Leitz, CRNA Pre-anesthesia Checklist: Patient identified, Patient being monitored, Timeout performed, Emergency Drugs available and Suction available Patient Re-evaluated:Patient Re-evaluated prior to induction Oxygen Delivery Method: Circle system utilized Preoxygenation: Pre-oxygenation with 100% oxygen Induction Type: IV induction Ventilation: Mask ventilation without difficulty Laryngoscope Size: Mac and 3 Grade View: Grade I Tube type: Oral Tube size: 7.0 mm Number of attempts: 1 Airway Equipment and Method: Stylet Placement Confirmation: ETT inserted through vocal cords under direct vision,  positive ETCO2 and breath sounds checked- equal and bilateral Secured at: 21 cm Tube secured with: Tape Dental Injury: Teeth and Oropharynx as per pre-operative assessment

## 2020-10-29 NOTE — Discharge Instructions (Signed)

## 2020-10-29 NOTE — Anesthesia Postprocedure Evaluation (Signed)
Anesthesia Post Note  Patient: Victoria Andrade  Procedure(s) Performed: CYSTOSCOPY/RETROGRADE/URETEROSCOPY/HOLMIUM LASER/STENT PLACEMENT (Left )     Patient location during evaluation: PACU Anesthesia Type: General Level of consciousness: awake and alert, patient cooperative and oriented Pain management: pain level controlled Vital Signs Assessment: post-procedure vital signs reviewed and stable Respiratory status: spontaneous breathing, nonlabored ventilation and respiratory function stable Cardiovascular status: blood pressure returned to baseline and stable Postop Assessment: no apparent nausea or vomiting Anesthetic complications: no   No complications documented.  Last Vitals:  Vitals:   10/29/20 1715 10/29/20 1730  BP: (!) 148/70   Pulse: 61 74  Resp: 14 16  Temp: (!) 36.2 C (!) 36.4 C  SpO2: 100% (!) 89%    Last Pain:  Vitals:   10/29/20 1730  TempSrc:   PainSc: 0-No pain                 Noha Milberger,E. Jenavi Beedle

## 2020-10-29 NOTE — Op Note (Signed)
Preoperative diagnosis: Left ureteral calculi  Postoperative diagnosis: Left ureteral calculi  Procedure:  1. Cystoscopy 2. Left ureteroscopy and stone removal 3. Ureteroscopic laser lithotripsy 4. Left ureteral stent placement (6 x24 - no string) 5. Left retrograde pyelography with interpretation  Surgeon: Pryor Curia. M.D.  Anesthesia: General  Complications: None  Intraoperative findings: Left retrograde pyelography demonstrated multiple large filling defects within the distal left ureter consistent with the patient's known calculi without other abnormalities.  EBL: Minimal  Specimens: 1. Left ureteral calculi  Disposition of specimens: [facility] for stone analysis  Indication: GRISELDA BRAMBLETT is a 74 y.o. year old patient with urolithiasis.  She presented last week with multiple, large left ureteral calculi. After reviewing the management options for treatment, the patient elected to proceed with the above surgical procedure(s). We have discussed the potential benefits and risks of the procedure, side effects of the proposed treatment, the likelihood of the patient achieving the goals of the procedure, and any potential problems that might occur during the procedure or recuperation. Informed consent has been obtained.  Description of procedure:  The patient was taken to the operating room and general anesthesia was induced.  The patient was placed in the dorsal lithotomy position, prepped and draped in the usual sterile fashion, and preoperative antibiotics were administered. A preoperative time-out was performed.   Cystourethroscopy was performed.  The patient's urethra was examined and was normal. The bladder was then systematically examined in its entirety. There was no evidence for any bladder tumors, stones, or other mucosal pathology.    Attention then turned to the left ureteral orifice and a ureteral catheter was used to intubate the ureteral  orifice.  Omnipaque contrast was injected through the ureteral catheter and a retrograde pyelogram was performed with findings as dictated above.  A 0.38 sensor guidewire was then advanced up the left ureter into the renal pelvis under fluoroscopic guidance. The 6 Fr semirigid ureteroscope was then advanced into the ureter next to the guidewire and the calculus was identified.   The stone was then fragmented with the 365 micron holmium laser fiber on a setting of 0.6J and frequency of 6 Hz.   All stones were then removed from the ureter with a zero tip nitinol basket.  Reinspection of the ureter revealed no remaining visible stones or fragments.   The wire was then backloaded through the cystoscope and a ureteral stent was advance over the wire using Seldinger technique.  The stent was positioned appropriately under fluoroscopic and cystoscopic guidance.  The wire was then removed with an adequate stent curl noted in the renal pelvis as well as in the bladder.  The bladder was then emptied and the procedure ended.  The patient appeared to tolerate the procedure well and without complications.  The patient was able to be awakened and transferred to the recovery unit in satisfactory condition.

## 2020-10-29 NOTE — Transfer of Care (Signed)
Immediate Anesthesia Transfer of Care Note  Patient: Victoria Andrade  Procedure(s) Performed: Procedure(s): CYSTOSCOPY/RETROGRADE/URETEROSCOPY/HOLMIUM LASER/STENT PLACEMENT (Left)  Patient Location: PACU  Anesthesia Type:General  Level of Consciousness: Alert, Awake, Oriented  Airway & Oxygen Therapy: Patient Spontanous Breathing  Post-op Assessment: Report given to RN  Post vital signs: Reviewed and stable  Last Vitals:  Vitals:   10/29/20 1217  BP: (!) 153/85  Pulse: 76  Resp: 18  Temp: 36.7 C  SpO2: 72%    Complications: No apparent anesthesia complications

## 2020-10-30 ENCOUNTER — Encounter (HOSPITAL_COMMUNITY): Payer: Self-pay | Admitting: Urology

## 2020-11-02 ENCOUNTER — Other Ambulatory Visit: Payer: Medicare PPO

## 2020-11-06 DIAGNOSIS — N202 Calculus of kidney with calculus of ureter: Secondary | ICD-10-CM | POA: Diagnosis not present

## 2020-11-19 DIAGNOSIS — E278 Other specified disorders of adrenal gland: Secondary | ICD-10-CM | POA: Diagnosis not present

## 2020-11-19 DIAGNOSIS — Z6834 Body mass index (BMI) 34.0-34.9, adult: Secondary | ICD-10-CM | POA: Diagnosis not present

## 2020-11-19 DIAGNOSIS — E118 Type 2 diabetes mellitus with unspecified complications: Secondary | ICD-10-CM | POA: Diagnosis not present

## 2020-11-19 DIAGNOSIS — I10 Essential (primary) hypertension: Secondary | ICD-10-CM | POA: Diagnosis not present

## 2020-11-28 DIAGNOSIS — Z6834 Body mass index (BMI) 34.0-34.9, adult: Secondary | ICD-10-CM | POA: Diagnosis not present

## 2020-11-28 DIAGNOSIS — E6609 Other obesity due to excess calories: Secondary | ICD-10-CM | POA: Diagnosis not present

## 2020-11-28 DIAGNOSIS — B355 Tinea imbricata: Secondary | ICD-10-CM | POA: Diagnosis not present

## 2020-12-04 DIAGNOSIS — L570 Actinic keratosis: Secondary | ICD-10-CM | POA: Diagnosis not present

## 2020-12-04 DIAGNOSIS — D1801 Hemangioma of skin and subcutaneous tissue: Secondary | ICD-10-CM | POA: Diagnosis not present

## 2020-12-04 DIAGNOSIS — L821 Other seborrheic keratosis: Secondary | ICD-10-CM | POA: Diagnosis not present

## 2020-12-04 DIAGNOSIS — L918 Other hypertrophic disorders of the skin: Secondary | ICD-10-CM | POA: Diagnosis not present

## 2020-12-04 DIAGNOSIS — L650 Telogen effluvium: Secondary | ICD-10-CM | POA: Diagnosis not present

## 2020-12-04 DIAGNOSIS — D225 Melanocytic nevi of trunk: Secondary | ICD-10-CM | POA: Diagnosis not present

## 2020-12-04 NOTE — Progress Notes (Signed)
74 y.o. Victoria Andrade Widowed Caucasian female here for annual exam.    Seen for a 5 mm right breast mass at 9:00 on 10/08/20 and she was dx with ductal ectasia.  She thinks that here may be another mass next to it, at 12:00?  Has been seeing urologist for kidney stones.  Has IBS.  PCP:  Sharilyn Sites, MD   Patient's last menstrual period was 12/29/1976 (approximate).           Sexually active: No.  The current method of family planning is status post hysterectomy.    Exercising: No.  The patient does not participate in regular exercise at present. Smoker:  no  Health Maintenance: Pap: 2010 Neg History of abnormal Pap:  no MMG: 10-25-20 Diag.Rt.Br.w/US--Neg/density C/BiRads2.   06-25-20 3D/Neg/density C/BiRads1.  Colonoscopy:  2014, due in 2024. BMD: 04-16-20  Result :Osteopenia--took Fosamax in past & had reflux.  FRAX - low fracture risk. TDaP: PCP Gardasil:   no HIV: no Hep C: no Screening Labs:  PCP.   reports that she has never smoked. She has never used smokeless tobacco. She reports current alcohol use. She reports that she does not use drugs.  Past Medical History:  Diagnosis Date  . Anxiety    takes xanax  . Arthritis    fingers and neck  . Cataract immature   . Diabetes mellitus without complication ([place])   . Elevated hemoglobin A1c March 2016   level 6.2  . Fatty liver   . GERD (gastroesophageal reflux disease)   . History of bronchitis   . History of hiatal hernia   . History of kidney stones   . Hypertension   . IBS (irritable bowel syndrome)   . Imbalance   . Macular degeneration   . Melanoma ([place])    left eye  . Migraine   . Neuropathy   . Osteopenia 2016  . PONV (postoperative nausea and vomiting)    Benadryl help with N/V since she can no longer have SCOP  . Pre-diabetes   . Prediabetes 2019    Past Surgical History:  Procedure Laterality Date  . ABDOMINAL HYSTERECTOMY  1978   partial  . BILATERAL SALPINGOOPHORECTOMY  2010   with robot  .  BREAST BIOPSY Right 03/06/2008   benign  . CATARACT EXTRACTION, BILATERAL  2018  . CHOLECYSTECTOMY  2005   lap  . COLONOSCOPY    . CYSTOSCOPY N/A 03/13/2015   Procedure: CYSTOSCOPY;  Surgeon: Nunzio Cobbs, MD;  Location: [facility];  Service: Gynecology;  Laterality: N/A;  . CYSTOSCOPY/URETEROSCOPY/HOLMIUM LASER/STENT PLACEMENT Left 10/29/2020   Procedure: CYSTOSCOPY/RETROGRADE/URETEROSCOPY/HOLMIUM LASER/STENT PLACEMENT;  Surgeon: Raynelle Bring, MD;  Location: [facility];  Service: Urology;  Laterality: Left;  . IR NEPHRO TUBE REMOV/FL  03/20/2020  . IR NEPHROSTOGRAM RIGHT THRU EXISTING ACCESS  03/20/2020  . IR URETERAL STENT RIGHT NEW ACCESS W/O SEP NEPHROSTOMY CATH  03/15/2020  . LITHOTRIPSY     20 years ago  . NEPHROLITHOTOMY Right 03/15/2020   Procedure: NEPHROLITHOTOMY PERCUTANEOUS/ INTERVENTIONAL RADIOLOCGY TO PLACE POSTERIOR CALYX PERCUTANEOUS ACCESS PRIOR;  Surgeon: Raynelle Bring, MD;  Location: [facility];  Service: Urology;  Laterality: Right;  NEEDS 150 MIN FOR PROCEDURE  . ROBOTIC ASSISTED SALPINGO OOPHERECTOMY Left 03/13/2015   Procedure: ROBOTIC ASSISTED INCISION OF  LEFT PELVIC MASS, LYSIS OF ADHESIONS  WITH PELVIC WASHINGS;  Surgeon: Nunzio Cobbs, MD;  Location: [facility];  Service: Gynecology;  Laterality: Left;  . SHOULDER SURGERY Left  bone spurs  . TONSILLECTOMY     as child  . TUBAL LIGATION  1976  . UPPER GI ENDOSCOPY      Current Outpatient Medications  Medication Sig Dispense Refill  . ACCU-CHEK GUIDE test strip     . Accu-Chek Softclix Lancets lancets     . ALPRAZolam (XANAX) 1 MG tablet Take 1.5 mg by mouth See admin instructions. Take 1.5 mg at night, may take a 0.5 mg dose as needed for anxiety    . Blood Glucose Monitoring Suppl (ACCU-CHEK GUIDE) w/Device KIT     . Cholecalciferol (VITAMIN D3) 5000 UNITS CAPS Take 5,000 Units by mouth daily.     . diphenhydrAMINE (BENADRYL) 25 MG tablet Take 25 mg by mouth at bedtime.     . docusate sodium  (COLACE) 100 MG capsule Take 100 mg by mouth daily as needed for mild constipation.    . DULoxetine (CYMBALTA) 60 MG capsule Take 60 mg by mouth at bedtime.    . famotidine (PEPCID) 20 MG tablet Take 20 mg by mouth daily as needed for heartburn or indigestion.     . hydrochlorothiazide (HYDRODIURIL) 25 MG tablet Take 25 mg by mouth See admin instructions. Take 25 mg in the morning, may take a second 25 mg at lunch if skipped linzess dose    . JANUVIA 100 MG tablet Take 100 mg by mouth daily.     Marland Kitchen LINZESS 290 MCG CAPS capsule Take 290 mcg by mouth daily.     Marland Kitchen loratadine (CLARITIN) 10 MG tablet Take 10 mg by mouth daily.    . montelukast (SINGULAIR) 10 MG tablet Take 10 mg by mouth at bedtime.    . Multiple Vitamins-Minerals (PRESERVISION AREDS) CAPS Take 1 capsule by mouth daily.    . ondansetron (ZOFRAN) 4 MG tablet Take 4 mg by mouth daily as needed for nausea or vomiting.     Vladimir Faster Glycol-Propyl Glycol (SYSTANE ULTRA PF OP) Place 1 drop into both eyes in the morning and at bedtime.     . simethicone (GAS-X) 80 MG chewable tablet Chew 80 mg by mouth daily as needed (gas).     . traMADol (ULTRAM) 50 MG tablet Take 1-2 tablets (50-100 mg total) by mouth every 6 (six) hours as needed (pain). 15 tablet 0   No current facility-administered medications for this visit.    Family History  Adopted: Yes  Problem Relation Age of Onset  . Hypertension Mother   . Thyroid disease Mother   . Diabetes Maternal Grandmother   . Hypertension Maternal Grandmother   . Heart attack Maternal Grandmother   . Hypertension Maternal Grandfather   . Stroke Maternal Grandfather   . Stroke Maternal Uncle   . Stroke Maternal Uncle   . Heart attack Maternal Uncle   . Heart attack Maternal Uncle   . Lung cancer Maternal Uncle     Review of Systems  All other systems reviewed and are negative.   Exam:   BP 138/60 (Cuff Size: Large)   Pulse 84   Resp 16   Ht 5' 1.25" (1.556 m)   Wt 187 lb (84.8 kg)    LMP 12/29/1976 (Approximate)   BMI 35.05 kg/m     General appearance: alert, cooperative and appears stated age Head: normocephalic, without obvious abnormality, atraumatic Neck: no adenopathy, supple, symmetrical, trachea midline and thyroid normal to inspection and palpation Lungs: clear to auscultation bilaterally Breasts: right normal appearance, 5 mm firm round mass at 9:00 at edge  of areola, No nipple retraction or dimpling, No nipple discharge or bleeding, No axillary adenopathy Left - normal appearance, no mass or tenderness, No nipple retraction or dimpling, No nipple discharge or bleeding, No axillary adenopathy Heart: regular rate and rhythm Abdomen: soft, non-tender; no masses, no organomegaly Extremities: extremities normal, atraumatic, no cyanosis or edema Skin: skin color, texture, turgor normal. No rashes or lesions Lymph nodes: cervical, supraclavicular, and axillary nodes normal. Neurologic: grossly normal  Pelvic: External genitalia:  no lesions              No abnormal inguinal nodes palpated.              Urethra:  normal appearing urethra with no masses, tenderness or lesions              Bartholins and Skenes: normal                 Vagina: normal appearing vagina with normal color and discharge, no lesions              Cervix: absent              Pap taken: No. Bimanual Exam:  Uterus:  absent              Adnexa: no mass, fullness, tenderness              Rectal exam: Yes.  .  Confirms.              Anus:  normal sphincter tone, no lesions  Chaperone was present for exam.  Assessment:   Well woman visit with normal exam. Status post TAH.  Status post laparoscopic BSO with robotic approach.  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 Osteopenia.Followed by PCP. Depression and anxiety.  On Cymbalta. DM. Skin tags and nevi.  Right breast mass.  Ductal ectasia on imaging.  Stable on exam. Renal  stones.  Plan: Mammogram screening in June or July.   She will let me know if the mass is getting bigger.  Self breast awareness reviewed. Pap and HR HPV as above. Guidelines for Calcium, Vitamin D, regular exercise program including cardiovascular and weight bearing exercise.   Follow up annually and prn.

## 2020-12-05 ENCOUNTER — Other Ambulatory Visit: Payer: Self-pay

## 2020-12-05 ENCOUNTER — Encounter: Payer: Self-pay | Admitting: Obstetrics and Gynecology

## 2020-12-05 ENCOUNTER — Ambulatory Visit (INDEPENDENT_AMBULATORY_CARE_PROVIDER_SITE_OTHER): Payer: Medicare PPO | Admitting: Obstetrics and Gynecology

## 2020-12-05 VITALS — BP 138/60 | HR 84 | Resp 16 | Ht 61.25 in | Wt 187.0 lb

## 2020-12-05 DIAGNOSIS — Z01419 Encounter for gynecological examination (general) (routine) without abnormal findings: Secondary | ICD-10-CM

## 2020-12-05 NOTE — Patient Instructions (Signed)

## 2020-12-10 DIAGNOSIS — R350 Frequency of micturition: Secondary | ICD-10-CM | POA: Diagnosis not present

## 2020-12-10 DIAGNOSIS — N200 Calculus of kidney: Secondary | ICD-10-CM | POA: Diagnosis not present

## 2020-12-13 ENCOUNTER — Other Ambulatory Visit: Payer: Self-pay | Admitting: Urology

## 2020-12-13 ENCOUNTER — Encounter (HOSPITAL_COMMUNITY): Payer: Self-pay | Admitting: Urology

## 2020-12-13 ENCOUNTER — Other Ambulatory Visit (HOSPITAL_COMMUNITY)
Admission: RE | Admit: 2020-12-13 | Discharge: 2020-12-13 | Disposition: A | Discharge status: Home / Self Care | Payer: Medicare PPO | Source: Ambulatory Visit | Attending: Urology | Admitting: Urology

## 2020-12-13 DIAGNOSIS — Z01812 Encounter for preprocedural laboratory examination: Secondary | ICD-10-CM | POA: Insufficient documentation

## 2020-12-13 DIAGNOSIS — H40013 Open angle with borderline findings, low risk, bilateral: Secondary | ICD-10-CM | POA: Diagnosis not present

## 2020-12-13 DIAGNOSIS — H353132 Nonexudative age-related macular degeneration, bilateral, intermediate dry stage: Secondary | ICD-10-CM | POA: Diagnosis not present

## 2020-12-13 DIAGNOSIS — Z20822 Contact with and (suspected) exposure to covid-19: Secondary | ICD-10-CM | POA: Insufficient documentation

## 2020-12-13 DIAGNOSIS — H26492 Other secondary cataract, left eye: Secondary | ICD-10-CM | POA: Diagnosis not present

## 2020-12-13 DIAGNOSIS — D487 Neoplasm of uncertain behavior of other specified sites: Secondary | ICD-10-CM | POA: Diagnosis not present

## 2020-12-13 LAB — SARS CORONAVIRUS 2 (TAT 6-24 HRS): SARS Coronavirus 2: NEGATIVE

## 2020-12-13 NOTE — Progress Notes (Addendum)
COVID Vaccine Completed: Yes Date COVID Vaccine completed: 04/03/20 COVID vaccine manufacturer:  [employer]   PCP - Sharilyn Sites, MD Cardiologist - N/A  Chest x-ray - greater than 1 year in epic EKG - 03/09/20 in epic Stress Test - greater than 2 years in epic ECHO - greater than 2 years in epic Cardiac Cath - N/A Pacemaker/ICD device last checked: N/A  Sleep Study - N/A CPAP - N/A  Fasting Blood Sugar - 150-290's Checks Blood Sugar __2-3___ times a week Hgb A1c (7.7) 10/29/20 in epic  Blood Thinner Instructions: N/A Aspirin Instructions: N/A Last Dose:N/A  Activity level:  Can go up a flight of stairs without stopping and without symptoms    Anesthesia review: N/A  Patient denies shortness of breath, fever, cough and chest pain at PAT appointment   Patient verbalized understanding of instructions that were given to them at the PAT appointment. Patient was also instructed that they will need to review over the PAT instructions again at home before surgery.

## 2020-12-14 ENCOUNTER — Encounter (HOSPITAL_COMMUNITY): Payer: Self-pay | Admitting: Urology

## 2020-12-14 ENCOUNTER — Other Ambulatory Visit: Payer: Self-pay

## 2020-12-14 NOTE — H&P (Signed)
Office Visit Report     12/10/2020   --------------------------------------------------------------------------------   Victoria Andrade  MRN: [id number]  DOB: 03/06/1946, 74 year old Female  SSN: [id number]   PRIMARY CARE:  Sharilyn Sites, MD  REFERRING:  Raynelle Bring, Eduardo Osier  PROVIDER:  Raynelle Bring, M.D.  TREATING:  Daine Gravel, NP  LOCATION:  [facility] [phone]     --------------------------------------------------------------------------------   CC/HPI: Urolithiasis   She returns today after undergoing left-sided ureteroscopic laser lithotripsy for numerous left ureteral stones. She follows up today for cystoscopy and stent removal. She does have large burden remaining left renal calculi.    12/10/20: Victoria Andrade presents today for follow up regarding left stone burden. She underwent a renal US which shows concern for a stone at the left UPJ, this does have some mild hydronephrosis associated with it. urinalysis is concerning for bacteriuria, although she denies fevers, chills, and gross hematuria. she endorses some fatigue and overall flank discomfort. Her initial plan was to wait until after the holidays for any definitive stone intervention.     ALLERGIES: Adhesive tape Ciprofloxacin HCl TABS - Skin Rash Keflex TABS - Skin Rash Latex - Swelling, Trouble Breathing Penicillins - Skin Rash PredniSONE TABS Sulfa Drugs - Skin Rash Tamsulosin HCl CAPS - Other Reaction, hair loss    MEDICATIONS: Amerge 2.5 mg tablet Oral  Benadryl TABS Oral  Claritin TABS Oral  Duloxetine Hcl  Hydrochlorothiazide 25 mg tablet 1 Oral  Januvia  Linzess 145 mcg capsule 1 capsule PO Daily  Miralax  Montelukast Sodium 10 mg tablet Oral  Stool Softener  Vitamin D-3 5000 UNIT TABS Oral  Xanax 1 mg tablet Oral     GU PSH: Cysto Remove Stent FB Sim - 11/06/2020 ESWL - 2017, 2016, 2013, 2013 Hysterectomy Unilat SO - 2013 Percut Stone Removal >2cm, Right -  03/15/2020 Ureteroscopic laser litho, Left - 10/29/2020     NON-GU PSH: Cataract surgery, Bilateral Cataract Surgery.., Bilateral Cholecystectomy (laparoscopic) Remove Tonsils - 2008 Shoulder Joint Surgery, Left - 2018 Tubal Ligation - 2008     GU PMH: Renal calculus - 11/06/2020, - 10/03/2020, Nephrolithiasis, - 2017 Ureteral calculus - 11/06/2020, (Stable), - 10/26/2020, Calculus of ureter, - 2014 Flank Pain (Worsening, Chronic), Right, Culture urine. No ABX unless culture proven UTI. Reassured no acute finding today today explain flank pain. Recommend she alternate Tylenol/NSAID and heat/ice for pain. If pain persist or worsens may need CT urogram. - 2019 Unspecified condition associated with female genital organs and menstrual cycle, Adnexal mass - 2016 Other microscopic hematuria, Microscopic hematuria - 2015      PMH Notes:   1) Urolithiasis: She has a history of calcium oxalate urolithiasis. She has a history of low urine volume and hyperoxaluria.   Current treatment: Fluid hydration, calcium citrate, low oxalate diet, high citrate diet  Prior treatment: HCTZ (unable to tolerate)   1995: ESWL  Apr 2013: ESWL 8 mm left ureteral stone  June 2013: ESWL of multiple left renal calculi  Aug 2013: 24 hr urine - low urine volume, hyperoxaluria  Feb 2014: 24 hr urine - Stone risk significantly reduced  Apr 2015: 24 hr urine - Calcium and oxalate increased  Dec 2015: 24 hr urine - Hypercalciuria and high urine sodium - increased HCTZ to 25 mg q am and 12.5 mg q pm, reduce dietary sodium  Jul 2016: 24 hr urine - Hyeroxaluria and hypocitraturia - not compliant with calcium treatment -- plan to increase calcium supplement  compliance  Dec 2016: ESWL of right renal calculus  Mar 2017: ESWL of left renal calculus  Mar 2021: R PCNL  May 2021: 24 hr urine - Low urine volume, hyperoxaluria, hypocitraturia  Nov 2021: Left ureteroscopic laser lithotripsy (calcium oxalate monohydrate mostly)      NON-GU PMH: Anxiety, Anxiety - 2014 Irritable bowel syndrome with diarrhea, Irritable Bowel Syndrome - 2014 GERD Hypertension    FAMILY HISTORY: Acute Myocardial Infarction - Mother, Runs In Family Chronic Obstructive Pulmonary Disease - Mother Diabetes - Runs In Family Ischemic Stroke - Mother   SOCIAL HISTORY: Marital Status: Married Preferred Language: English; Ethnicity: Not Hispanic Or Latino; Race: White Current Smoking Status: Patient has never smoked.  Does not use smokeless tobacco. Types of alcohol consumed: Wine. Social Drinker.  Does not use drugs. Does not drink caffeine. Has not had a blood transfusion.    REVIEW OF SYSTEMS:    GU Review Female:   Patient reports frequent urination and burning /pain with urination. Patient denies hard to postpone urination, get up at night to urinate, leakage of urine, stream starts and stops, trouble starting your stream, have to strain to urinate, and being pregnant.  Gastrointestinal (Upper):   Patient denies nausea, vomiting, and indigestion/ heartburn.  Gastrointestinal (Lower):   Patient denies diarrhea and constipation.  Constitutional:   Patient denies fever, night sweats, weight loss, and fatigue.  Skin:   Patient denies skin rash/ lesion and itching.  Eyes:   Patient denies blurred vision and double vision.  Ears/ Nose/ Throat:   Patient denies sore throat and sinus problems.  Hematologic/Lymphatic:   Patient denies swollen glands and easy bruising.  Cardiovascular:   Patient denies leg swelling and chest pains.  Respiratory:   Patient denies cough and shortness of breath.  Endocrine:   Patient denies excessive thirst.  Musculoskeletal:   Patient denies back pain and joint pain.  Neurological:   Patient denies dizziness and headaches.  Psychologic:   Patient denies depression and anxiety.   Notes: flank discomfort    VITAL SIGNS:      12/10/2020 08:40 AM  BP 128/77 mmHg  Pulse 85 /min  Temperature 97.8 F / 36.5  C   GU PHYSICAL EXAMINATION:      Notes: left cva discomfort   MULTI-SYSTEM PHYSICAL EXAMINATION:    Constitutional: Well-nourished. No physical deformities. Normally developed. Good grooming.  Respiratory: No labored breathing, no use of accessory muscles.   Cardiovascular: Normal temperature, normal extremity pulses, no swelling, no varicosities.  Lymphatic: No enlargement of neck, axillae, groin.  Neurologic / Psychiatric: Oriented to time, oriented to place, oriented to person. No depression, no anxiety, no agitation.  Gastrointestinal: No mass, no tenderness, no rigidity, non obese abdomen.  Eyes: Normal conjunctivae. Normal eyelids.  Musculoskeletal: Normal gait and station of head and neck.     Complexity of Data:  Source Of History:  Patient, Medical Record Summary  Records Review:   Previous Doctor Records, Previous Patient Records  X-Ray Review: KUB: Reviewed Films. Reviewed Report.  Renal Ultrasound (Limited): Reviewed Films. Discussed With Patient.     12/10/20  Urinalysis  Urine Appearance Cloudy   Urine Color Yellow   Urine Glucose Neg mg/dL  Urine Bilirubin Neg mg/dL  Urine Ketones Neg mg/dL  Urine Specific Gravity 1.025   Urine Blood 3+ ery/uL  Urine pH 5.5   Urine Protein 1+ mg/dL  Urine Urobilinogen 0.2 mg/dL  Urine Nitrites Neg   Urine Leukocyte Esterase 1+ leu/uL  Urine WBC/hpf 0 -  5/hpf   Urine RBC/hpf >60/hpf   Urine Epithelial Cells 0 - 5/hpf   Urine Bacteria Mod (26-50/hpf)   Urine Mucous Not Present   Urine Yeast NS (Not Seen)   Urine Trichomonas Not Present   Urine Cystals NS (Not Seen)   Urine Casts NS (Not Seen)   Urine Sperm Not Present    PROCEDURES:         Renal Ultrasound (Limited) - 70962  Kidney: Left Length: 10.8 cm Depth: 6.3 cm Cortical Width: 2.0 cm Width: 4.8 cm    Left Kidney/Ureter:  Calcifications throughout kidney. Cluster at UP. Cluster of calcifications in proximal ureter. Small amount of fluid within ureter but does  not extend into renal pelvis.  Bladder:  PVR 13.85 ml      Patient confirmed No Neulasta OnPro Device.            KUB - [id number]  A single view of the abdomen is obtained. there is 52mm x 55mm calcification noted within the expected anatomical position of the left upj/ureter. there is a left upper pole opactiy measuring approximately 38mm, with a smaller adjacent opacity measuring approx 55mm. within the right renal shadow there appears to be a opacity within the right lower pole measuring approx 88mm. there is a normal bowel gas pattern.       . Patient confirmed No Neulasta OnPro Device.           Urinalysis w/Scope - 81001 Dipstick Dipstick Cont'd Micro  Color: Yellow Bilirubin: Neg WBC/hpf: 0 - 5/hpf  Appearance: Cloudy Ketones: Neg RBC/hpf: >60/hpf  Specific Gravity: 1.025 Blood: 3+ Bacteria: Mod (26-50/hpf)  pH: 5.5 Protein: 1+ Cystals: NS (Not Seen)  Glucose: Neg Urobilinogen: 0.2 Casts: NS (Not Seen)    Nitrites: Neg Trichomonas: Not Present    Leukocyte Esterase: 1+ Mucous: Not Present      Epithelial Cells: 0 - 5/hpf      Yeast: NS (Not Seen)      Sperm: Not Present    Notes:      ASSESSMENT:      ICD-10 Details  1 GU:   Renal calculus - N20.0 Left, Chronic, Worsening  2   Ureteral calculus - N20.1 Left, Acute, Systemic Symptoms   PLAN:            Medications New Meds: Macrobid 100 mg capsule 1 capsule PO BID   #10  0 Refill(s)            Orders Labs CULTURE, URINE  X-Rays: KUB          Schedule         Document Letter(s):  Created for Patient: Clinical Summary         Notes:   urinalysis with bacteriuria. will send for culture. advised empirical treatment with macrobid as this is the only medication she can tolerate. Based off of her renal ultrasound and her KUB today I am concerned that she may require definitive stone management before the holidays. However I will discuss this further with her urologist to see how he would like to proceed and  follow up with her regarding his recommendations. I did advised strict return precautions for fevers, chills, worsening symptomatology. She verbalizd understanding.         Next Appointment:      Next Appointment: 10/01/2021 02:15 PM    Appointment Type: Office Visit Established Patient    Location: [facility] [phone]    Provider: Raynelle Bring, M.D.  Reason for Visit: 1 year ov      * Signed by Daine Gravel, NP on 12/11/20 at 5:51 PM (EST)*       APPENDED NOTES:  I spoke with Victoria Andrade today. After reviewing options, she appears to still want to proceed with ureteroscopic treatment of her left renal stone burden as opposed to undergoing a percutaneous nephrolithotomy. Due to the fact that her stone has moved and is now causing some symptoms, she will be scheduled for 1st stage left ureteroscopic laser lithotripsy and stent placement. She understands that this may require multiple staged ureteroscopy is considering the very large stone burden. We reviewed the potential risks and the expected recovery process. She gives informed consent to proceed.     * Signed by Raynelle Bring, M.D. on 12/12/20 at 3:06 PM (EST)*

## 2020-12-17 ENCOUNTER — Encounter (HOSPITAL_COMMUNITY): Payer: Self-pay | Admitting: Urology

## 2020-12-17 ENCOUNTER — Ambulatory Visit (HOSPITAL_COMMUNITY): Payer: Medicare PPO | Admitting: Anesthesiology

## 2020-12-17 ENCOUNTER — Ambulatory Visit (HOSPITAL_COMMUNITY)
Admission: RE | Admit: 2020-12-17 | Discharge: 2020-12-17 | Disposition: A | Discharge status: Home / Self Care | Payer: Medicare PPO | Attending: Urology | Admitting: Urology

## 2020-12-17 ENCOUNTER — Ambulatory Visit (HOSPITAL_COMMUNITY): Payer: Medicare PPO

## 2020-12-17 ENCOUNTER — Encounter (HOSPITAL_COMMUNITY)
Admission: RE | Disposition: A | Discharge status: Home / Self Care | Payer: Self-pay | Source: Home / Self Care | Attending: Urology

## 2020-12-17 DIAGNOSIS — Z79899 Other long term (current) drug therapy: Secondary | ICD-10-CM | POA: Insufficient documentation

## 2020-12-17 DIAGNOSIS — Z7984 Long term (current) use of oral hypoglycemic drugs: Secondary | ICD-10-CM | POA: Insufficient documentation

## 2020-12-17 DIAGNOSIS — Z880 Allergy status to penicillin: Secondary | ICD-10-CM | POA: Insufficient documentation

## 2020-12-17 DIAGNOSIS — K219 Gastro-esophageal reflux disease without esophagitis: Secondary | ICD-10-CM | POA: Diagnosis not present

## 2020-12-17 DIAGNOSIS — Z888 Allergy status to other drugs, medicaments and biological substances status: Secondary | ICD-10-CM | POA: Insufficient documentation

## 2020-12-17 DIAGNOSIS — N200 Calculus of kidney: Secondary | ICD-10-CM | POA: Diagnosis not present

## 2020-12-17 DIAGNOSIS — N202 Calculus of kidney with calculus of ureter: Secondary | ICD-10-CM | POA: Diagnosis not present

## 2020-12-17 DIAGNOSIS — Z882 Allergy status to sulfonamides status: Secondary | ICD-10-CM | POA: Diagnosis not present

## 2020-12-17 DIAGNOSIS — I10 Essential (primary) hypertension: Secondary | ICD-10-CM | POA: Diagnosis not present

## 2020-12-17 DIAGNOSIS — E119 Type 2 diabetes mellitus without complications: Secondary | ICD-10-CM | POA: Diagnosis not present

## 2020-12-17 DIAGNOSIS — Z881 Allergy status to other antibiotic agents status: Secondary | ICD-10-CM | POA: Diagnosis not present

## 2020-12-17 HISTORY — DX: Fibromyalgia: M79.7

## 2020-12-17 HISTORY — PX: CYSTOSCOPY WITH RETROGRADE PYELOGRAM, URETEROSCOPY AND STENT PLACEMENT: SHX5789

## 2020-12-17 LAB — CBC
HCT: 37.8 % (ref 36.0–46.0)
Hemoglobin: 12.7 g/dL (ref 12.0–15.0)
MCH: 30.3 pg (ref 26.0–34.0)
MCHC: 33.6 g/dL (ref 30.0–36.0)
MCV: 90.2 fL (ref 80.0–100.0)
Platelets: 233 10*3/uL (ref 150–400)
RBC: 4.19 MIL/uL (ref 3.87–5.11)
RDW: 12.9 % (ref 11.5–15.5)
WBC: 7.8 10*3/uL (ref 4.0–10.5)
nRBC: 0 % (ref 0.0–0.2)

## 2020-12-17 LAB — GLUCOSE, CAPILLARY
Glucose-Capillary: 199 mg/dL — ABNORMAL HIGH (ref 70–99)
Glucose-Capillary: 127 mg/dL — ABNORMAL HIGH (ref 70–99)

## 2020-12-17 LAB — BASIC METABOLIC PANEL
Anion gap: 11 (ref 5–15)
BUN: 17 mg/dL (ref 8–23)
CO2: 28 mmol/L (ref 22–32)
Calcium: 9.3 mg/dL (ref 8.9–10.3)
Chloride: 101 mmol/L (ref 98–111)
Creatinine, Ser: 0.8 mg/dL (ref 0.44–1.00)
GFR, Estimated: >60 mL/min (ref >60)
Glucose, Bld: 205 mg/dL — ABNORMAL HIGH (ref 70–99)
Potassium: 3.8 mmol/L (ref 3.5–5.1)
Sodium: 140 mmol/L (ref 135–145)

## 2020-12-17 SURGERY — CYSTOURETEROSCOPY, WITH RETROGRADE PYELOGRAM AND STENT INSERTION
Anesthesia: General | Laterality: Left

## 2020-12-17 MED ORDER — DEXAMETHASONE SODIUM PHOSPHATE 10 MG/ML IJ SOLN
INTRAMUSCULAR | Status: DC | PRN
  Administered 2020-12-17: 4 mg via INTRAVENOUS

## 2020-12-17 MED ORDER — IOHEXOL 300 MG/ML  SOLN
INTRAMUSCULAR | Status: DC | PRN
  Administered 2020-12-17: 14:00:00 6 mL

## 2020-12-17 MED ORDER — LIDOCAINE HCL (PF) 2 % IJ SOLN
INTRAMUSCULAR | Status: AC
  Filled 2020-12-17: qty 5

## 2020-12-17 MED ORDER — LACTATED RINGERS IV SOLN: INTRAVENOUS | Status: DC

## 2020-12-17 MED ORDER — GENTAMICIN SULFATE 40 MG/ML IJ SOLN
5.0000 mg/kg | INTRAVENOUS | Status: AC
  Administered 2020-12-17: 12:00:00 310 mg via INTRAVENOUS
  Filled 2020-12-17: qty 7.75

## 2020-12-17 MED ORDER — PROPOFOL 10 MG/ML IV BOLUS
INTRAVENOUS | Status: DC | PRN
  Administered 2020-12-17: 140 mg via INTRAVENOUS

## 2020-12-17 MED ORDER — FENTANYL CITRATE (PF) 100 MCG/2ML IJ SOLN: 25.0000 ug | INTRAMUSCULAR | Status: DC | PRN

## 2020-12-17 MED ORDER — PROPOFOL 10 MG/ML IV BOLUS
INTRAVENOUS | Status: AC
  Filled 2020-12-17: qty 20

## 2020-12-17 MED ORDER — ONDANSETRON HCL 4 MG/2ML IJ SOLN
INTRAMUSCULAR | Status: DC | PRN
  Administered 2020-12-17: 4 mg via INTRAVENOUS

## 2020-12-17 MED ORDER — FENTANYL CITRATE (PF) 100 MCG/2ML IJ SOLN
INTRAMUSCULAR | Status: AC
  Administered 2020-12-17: 15:00:00 25 ug via INTRAVENOUS
  Filled 2020-12-17: qty 2

## 2020-12-17 MED ORDER — SODIUM CHLORIDE 0.9 % IR SOLN
Status: DC | PRN
  Administered 2020-12-17: 3000 mL

## 2020-12-17 MED ORDER — FENTANYL CITRATE (PF) 100 MCG/2ML IJ SOLN
INTRAMUSCULAR | Status: DC | PRN
  Administered 2020-12-17: 100 ug via INTRAVENOUS

## 2020-12-17 MED ORDER — FENTANYL CITRATE (PF) 100 MCG/2ML IJ SOLN
INTRAMUSCULAR | Status: AC
  Filled 2020-12-17: qty 2

## 2020-12-17 MED ORDER — TRAMADOL HCL 50 MG PO TABS: 50.0000 mg | ORAL_TABLET | Freq: Four times a day (QID) | ORAL | 0 refills | Status: DC | PRN

## 2020-12-17 MED ORDER — ONDANSETRON HCL 4 MG/2ML IJ SOLN: 4.0000 mg | Freq: Once | INTRAMUSCULAR | Status: DC | PRN

## 2020-12-17 MED ORDER — LIDOCAINE HCL (CARDIAC) PF 100 MG/5ML IV SOSY
PREFILLED_SYRINGE | INTRAVENOUS | Status: DC | PRN
  Administered 2020-12-17: 80 mg via INTRAVENOUS

## 2020-12-17 MED ORDER — 0.9 % SODIUM CHLORIDE (POUR BTL) OPTIME
TOPICAL | Status: DC | PRN
  Administered 2020-12-17: 13:00:00 1000 mL

## 2020-12-17 MED ORDER — ORAL CARE MOUTH RINSE
15.0000 mL | Freq: Once | OROMUCOSAL | Status: AC
  Administered 2020-12-17: 12:00:00 15 mL via OROMUCOSAL

## 2020-12-17 SURGICAL SUPPLY — 21 items
BAG URO CATCHER STRL LF (MISCELLANEOUS) ×2 IMPLANT
BASKET ZERO TIP NITINOL 2.4FR (BASKET) IMPLANT
BSKT STON RTRVL ZERO TP 2.4FR (BASKET)
CATH INTERMIT  6FR 70CM (CATHETERS) ×1 IMPLANT
CLOTH BEACON ORANGE TIMEOUT ST (SAFETY) ×2 IMPLANT
GLOVE BIOGEL M STRL SZ7.5 (GLOVE) ×2 IMPLANT
GOWN STRL REUS W/TWL LRG LVL3 (GOWN DISPOSABLE) ×2 IMPLANT
GUIDEWIRE STR DUAL SENSOR (WIRE) ×2 IMPLANT
GUIDEWIRE ZIPWRE .038 STRAIGHT (WIRE) IMPLANT
IV NS 1000ML (IV SOLUTION) ×2
IV NS 1000ML BAXH (IV SOLUTION) ×1 IMPLANT
KIT TURNOVER KIT A (KITS) IMPLANT
LASER FIB FLEXIVA PULSE ID 365 (Laser) IMPLANT
MANIFOLD NEPTUNE II (INSTRUMENTS) ×2 IMPLANT
PACK CYSTO (CUSTOM PROCEDURE TRAY) ×2 IMPLANT
SHEATH URETERAL 12FRX35CM (MISCELLANEOUS) ×1 IMPLANT
STENT URET 6FRX24 CONTOUR (STENTS) ×1 IMPLANT
TRACTIP FLEXIVA PULS ID 200XHI (Laser) IMPLANT
TRACTIP FLEXIVA PULSE ID 200 (Laser) ×4
TUBING CONNECTING 10 (TUBING) ×2 IMPLANT
TUBING UROLOGY SET (TUBING) ×2 IMPLANT

## 2020-12-17 NOTE — Anesthesia Preprocedure Evaluation (Signed)
Anesthesia Evaluation  Patient identified by MRN, date of birth, ID band Patient awake    Reviewed: Allergy & Precautions, NPO status , Patient's Chart, lab work & pertinent test results  History of Anesthesia Complications (+) PONV and history of anesthetic complications  Airway Mallampati: II  TM Distance: >3 FB Neck ROM: Full    Dental  (+) Caps, Teeth Intact, Dental Advisory Given   Pulmonary neg pulmonary ROS,    Pulmonary exam normal breath sounds clear to auscultation       Cardiovascular hypertension, Pt. on medications Normal cardiovascular exam Rhythm:Regular Rate:Normal     Neuro/Psych  Headaches, Anxiety    GI/Hepatic Neg liver ROS, hiatal hernia, GERD  Medicated and Controlled,  Endo/Other  diabetesObesity  Renal/GU Renal diseaseRenal calculi     Musculoskeletal  (+) Arthritis ,   Abdominal Normal abdominal exam  (+) + obese,   Peds  Hematology negative hematology ROS (+)   Anesthesia Other Findings Cleft uvula  Reproductive/Obstetrics Left adnexal mass                             Anesthesia Physical  Anesthesia Plan  ASA: II  Anesthesia Plan: General   Post-op Pain Management:    Induction: Intravenous  PONV Risk Score and Plan: 4 or greater and Ondansetron, Dexamethasone and Treatment may vary due to age or medical condition  Airway Management Planned: Oral ETT and LMA  Additional Equipment: None  Intra-op Plan:   Post-operative Plan: Extubation in OR  Informed Consent: I have reviewed the patients History and Physical, chart, labs and discussed the procedure including the risks, benefits and alternatives for the proposed anesthesia with the patient or authorized representative who has indicated his/her understanding and acceptance.     Dental advisory given  Plan Discussed with: CRNA  Anesthesia Plan Comments:         Anesthesia Quick  Evaluation

## 2020-12-17 NOTE — Op Note (Signed)
Preoperative diagnosis: Left renal calculi  Postoperative diagnosis: Left renal calculi  Procedure:  1. Cystoscopy 2. Left ureteroscopic laser lithotripsy (1st stage) 3. Left ureteral stent placement (6 x 24 - no string) 4. Left retrograde pyelography with interpretation  Surgeon: Pryor Curia. M.D.  Anesthesia: General  Complications: None  Intraoperative findings: Left retrograde pyelography demonstrated a large filling defect within the proximal left ureter initially consistent with the patient's known calculus without other abnormalities noted.  However, the stone was easily pushed back into the kidney and the filling defect was noted to be mobile consistent with a stone.  There was also a large filling defect in the upper pole calyx and a smaller filling defect in the lower pole calyx consistent with the patient's known stone burden.  EBL: Minimal  Specimen(s): None.  Indication: Victoria Andrade  is a 74 y.o. patient with urolithiasis. She developed movement of a large left renal stone to the UPJ on imaging and became symptomatic. After reviewing the management options for treatment, they elected to proceed with the above surgical procedure(s). We have discussed the potential benefits and risks of the procedure, side effects of the proposed treatment, the likelihood of the patient achieving the goals of the procedure, and any potential problems that might occur during the procedure or recuperation. Considering her large stone burden, she was counseled that percutaneous nephrostolithotomy would likely be the best procedure to render her stone free. However, she was interested in alternative options and we discussed staged ureteroscopic laser lithotripsy as an alternative. Informed consent has been obtained.  Description of procedure:  The patient was taken to the operating room and general anesthesia was induced.  The patient was placed in the dorsal lithotomy position, prepped  and draped in the usual sterile fashion, and preoperative antibiotics were administered. A preoperative time-out was performed.   Cystourethroscopy was performed.  The patient's urethra was examined and was normal. The bladder was then systematically examined in its entirety. There was no evidence for any bladder tumors, stones, or other mucosal pathology.    Attention then turned to the left ureteral orifice and a ureteral catheter was used to intubate the ureteral orifice.  Omnipaque contrast was injected through the ureteral catheter and a retrograde pyelogram was performed with findings as dictated above.  A 0.38 sensor guidewire was then advanced up the left ureter into the renal pelvis under fluoroscopic guidance.  I attempted semirigid ureteroscopy at first but the stone migrated proximally.  A 12/14 Fr ureteral access sheath was then advanced over the guide wire. The digital flexible ureteroscope was then advanced through the access sheath into the ureter next to the guidewire and her stone burden was examined.  The large stone initially in the proximal ureter/UPJ was located in an upper pole calyx and there was a 2nd large stone in the upper most calyx.  Finally, there was another smaller stone in a lower pole calyx.   The initial stone was then fragmented with the 200 micron holmium laser fiber on a setting of 0.3 J and frequency of 60 Hz.  The allowed dusting of the stone into very small fragments.  Once the main stone was fragmented, attention turned to the other stones.  The lower pole stone was similarly fragmented into only very small pieces.  Approximately 50% of the upper pole stone was able to be fragmented before the procedure was stopped due to poor visualization and having gone through the 2nd laser fiber.  The safety wire was then replaced and the access sheath removed.  The guidewire was backloaded through the cystoscope and a ureteral stent was advance over the wire using  Seldinger technique.  The stent was positioned appropriately under fluoroscopic and cystoscopic guidance.  The wire was then removed with an adequate stent curl noted in the renal pelvis as well as in the bladder.  The bladder was then emptied and the procedure ended.  The patient appeared to tolerate the procedure well and without complications.  The patient was able to be awakened and transferred to the recovery unit in satisfactory condition.   Pryor Curia MD

## 2020-12-17 NOTE — Transfer of Care (Signed)
Immediate Anesthesia Transfer of Care Note  Patient: Victoria Andrade  Procedure(s) Performed: CYSTOSCOPY WITH LEFT RETROGRADE PYELOGRAM, URETEROSCOPY HOLMIUM LASER AND STENT PLACEMENT (Left )  Patient Location: PACU  Anesthesia Type:General  Level of Consciousness: drowsy and patient cooperative  Airway & Oxygen Therapy: Patient Spontanous Breathing and Patient connected to face mask oxygen  Post-op Assessment: Report given to RN and Post -op Vital signs reviewed and stable  Post vital signs: Reviewed and stable  Last Vitals:  Vitals Value Taken Time  BP 179/77 12/17/20 1356  Temp    Pulse 72 12/17/20 1358  Resp 11 12/17/20 1358  SpO2 99 % 12/17/20 1358  Vitals shown include unvalidated device data.  Last Pain:  Vitals:   12/17/20 1113  TempSrc:   PainSc: 3       Patients Stated Pain Goal: 2 (16/10/96 0454)  Complications: No complications documented.

## 2020-12-17 NOTE — Interval H&P Note (Signed)
History and Physical Interval Note:  12/17/2020 11:42 AM  Victoria Andrade  has presented today for surgery, with the diagnosis of LEFT RENAL STONE.  The various methods of treatment have been discussed with the patient and family. After consideration of risks, benefits and other options for treatment, the patient has consented to  Procedure(s) with comments: CYSTOSCOPY WITH LEFT RETROGRADE PYELOGRAM, URETEROSCOPY HOLMIUM LASER AND STENT PLACEMENT (Left) - REQUESTING 2 HRS as a surgical intervention.  The patient's history has been reviewed, patient examined, no change in status, stable for surgery.  I have reviewed the patient's chart and labs.  Questions were answered to the patient's satisfaction.     [employer]

## 2020-12-17 NOTE — Anesthesia Postprocedure Evaluation (Signed)
Anesthesia Post Note  Patient: Victoria Andrade  Procedure(s) Performed: CYSTOSCOPY WITH LEFT RETROGRADE PYELOGRAM, URETEROSCOPY HOLMIUM LASER AND STENT PLACEMENT (Left )     Patient location during evaluation: PACU Anesthesia Type: General Level of consciousness: sedated and patient cooperative Pain management: pain level controlled Vital Signs Assessment: post-procedure vital signs reviewed and stable Respiratory status: spontaneous breathing Cardiovascular status: stable Anesthetic complications: no   No complications documented.  Last Vitals:  Vitals:   12/17/20 1500 12/17/20 1515  BP: (!) 162/66 (!) 162/90  Pulse: 70 72  Resp: 10   Temp:  36.5 C  SpO2: 93% 94%    Last Pain:  Vitals:   12/17/20 1515  TempSrc: Oral  PainSc: 2                  Nolon Nations

## 2020-12-17 NOTE — Anesthesia Procedure Notes (Signed)
Procedure Name: LMA Insertion Date/Time: 12/17/2020 12:19 PM Performed by: Raenette Rover, CRNA Pre-anesthesia Checklist: Patient identified, Emergency Drugs available, Suction available and Patient being monitored Patient Re-evaluated:Patient Re-evaluated prior to induction Oxygen Delivery Method: Circle system utilized Preoxygenation: Pre-oxygenation with 100% oxygen Induction Type: IV induction LMA: LMA inserted LMA Size: 4.0 Number of attempts: 1 Placement Confirmation: positive ETCO2 and breath sounds checked- equal and bilateral Tube secured with: Tape Dental Injury: Teeth and Oropharynx as per pre-operative assessment

## 2020-12-17 NOTE — Discharge Instructions (Signed)

## 2020-12-18 ENCOUNTER — Encounter (HOSPITAL_COMMUNITY): Payer: Self-pay | Admitting: Urology

## 2020-12-24 ENCOUNTER — Other Ambulatory Visit: Payer: Self-pay | Admitting: Urology

## 2021-01-08 DIAGNOSIS — N200 Calculus of kidney: Secondary | ICD-10-CM | POA: Diagnosis not present

## 2021-01-16 NOTE — Patient Instructions (Addendum)
DUE TO COVID-19 ONLY ONE VISITOR IS ALLOWED TO COME WITH YOU AND STAY IN THE WAITING ROOM ONLY DURING PRE OP AND PROCEDURE.   IF YOU WILL BE ADMITTED INTO THE HOSPITAL YOU ARE ALLOWED ONE SUPPORT PERSON DURING VISITATION HOURS ONLY (10AM -8PM)   . The support person may change daily. . The support person must pass our screening, gel in and out, and wear a mask at all times, including in the patient's room. . Patients must also wear a mask when staff or their support person are in the room.   COVID SWAB TESTING MUST BE COMPLETED ON:  Thursday, 01-17-21 @ 11:55 am    [address] [place], [place] [address]  (Must self quarantine after testing. Follow instructions on handout.)        Your procedure is scheduled on:  Monday, 01-21-21   Report to [facility] Main  Entrance    Report to admitting at 10:00 AM   Call this number if you have problems the morning of surgery [phone]   Do not eat food :After Midnight.   May have liquids until 9:00 AM  day of surgery  CLEAR LIQUID DIET  Foods Allowed                                                                     Foods Excluded  Water, Black Coffee and tea, regular and decaf            liquids that you cannot  Plain Jell-O in any flavor  (No red)                                   see through such as: Fruit ices (not with fruit pulp)                                      milk, soups, orange juice              Iced Popsicles (No red)                                      All solid food                                   Apple juices Sports drinks like Gatorade (No red) Lightly seasoned clear broth or consume(fat free) Sugar, honey syrup     Oral Hygiene is also important to reduce your risk of infection.                                    Remember - BRUSH YOUR TEETH THE MORNING OF SURGERY WITH YOUR REGULAR TOOTHPASTE   Do NOT smoke after Midnight   Take these medicines the morning of surgery with A SIP OF WATER:   Famotidine, Loratadine, Linzess, Alprazolam if needed   How to Manage  Your Diabetes Before and After Surgery  Why is it important to control my blood sugar before and after surgery? . Improving blood sugar levels before and after surgery helps healing and can limit problems. . A way of improving blood sugar control is eating a healthy diet by: o  Eating less sugar and carbohydrates o  Increasing activity/exercise o  Talking with your doctor about reaching your blood sugar goals . High blood sugars (greater than 180 mg/dL) can raise your risk of infections and slow your recovery, so you will need to focus on controlling your diabetes during the weeks before surgery. . Make sure that the doctor who takes care of your diabetes knows about your planned surgery including the date and location.  How do I manage my blood sugar before surgery? . Check your blood sugar at least 4 times a day, starting 2 days before surgery, to make sure that the level is not too high or low. o Check your blood sugar the morning of your surgery when you wake up and every 2 hours until you get to the Short Stay unit. . If your blood sugar is less than 70 mg/dL, you will need to treat for low blood sugar: o Do not take insulin. o Treat a low blood sugar (less than 70 mg/dL) with  cup of clear juice (cranberry or apple), 4 glucose tablets, OR glucose gel. o Recheck blood sugar in 15 minutes after treatment (to make sure it is greater than 70 mg/dL). If your blood sugar is not greater than 70 mg/dL on recheck, call [phone] for further instructions. . Report your blood sugar to the short stay nurse when you get to Short Stay.  . If you are admitted to the hospital after surgery: o Your blood sugar will be checked by the staff and you will probably be given insulin after surgery (instead of oral diabetes medicines) to make sure you have good blood sugar levels. o The goal for blood sugar control after surgery is  80-180 mg/dL.   WHAT DO I DO ABOUT MY DIABETES MEDICATION?  Marland Kitchen Do not take oral diabetes medicines (pills) the morning of surgery.  . THE NIGHT BEFORE SURGERY:  Januvia.   . THE MORNING OF SURGERY:  Do not take Januvia.  Reviewed and Endorsed by [facility] Patient Education Committee, August 2015                               You may not have any metal on your body including hair pins, jewelry, and body piercings             Do not wear make-up, lotions, powders, perfumes/cologne, or deodorant             Do not wear nail polish.  Do not shave  48 hours prior to surgery.     Do not bring valuables to the hospital. [facility] IS NOT RESPONSIBLE   FOR VALUABLES.   Contacts, dentures or bridgework may not be worn into surgery.     Patients discharged the day of surgery will not be allowed to drive home.   Special Instructions: Bring a copy of your healthcare power of attorney and living will documents         the day of surgery if you haven't scanned them in before.              Please read over the following  fact sheets you were given: IF YOU HAVE QUESTIONS ABOUT YOUR PRE OP INSTRUCTIONS PLEASE CALL [phone]   [facility] - Preparing for Surgery Before surgery, you can play an important role.  Because skin is not sterile, your skin needs to be as free of germs as possible.  You can reduce the number of germs on your skin by washing with CHG (chlorahexidine gluconate) soap before surgery.  CHG is an antiseptic cleaner which kills germs and bonds with the skin to continue killing germs even after washing. Please DO NOT use if you have an allergy to CHG or antibacterial soaps.  If your skin becomes reddened/irritated stop using the CHG and inform your nurse when you arrive at Short Stay. Do not shave (including legs and underarms) for at least 48 hours prior to the first CHG shower.  You may shave your face/neck.  Please follow these instructions carefully:  1.  Shower with CHG  Soap the night before surgery and the  morning of surgery.  2.  If you choose to wash your hair, wash your hair first as usual with your normal  shampoo.  3.  After you shampoo, rinse your hair and body thoroughly to remove the shampoo.                             4.  Use CHG as you would any other liquid soap.  You can apply chg directly to the skin and wash.  Gently with a scrungie or clean washcloth.  5.  Apply the CHG Soap to your body ONLY FROM THE NECK DOWN.   Do   not use on face/ open                           Wound or open sores. Avoid contact with eyes, ears mouth and   genitals (private parts).                       Wash face,  Genitals (private parts) with your normal soap.             6.  Wash thoroughly, paying special attention to the area where your    surgery  will be performed.  7.  Thoroughly rinse your body with warm water from the neck down.  8.  DO NOT shower/wash with your normal soap after using and rinsing off the CHG Soap.                9.  Pat yourself dry with a clean towel.            10.  Wear clean pajamas.            11.  Place clean sheets on your bed the night of your first shower and do not  sleep with pets. Day of Surgery : Do not apply any lotions/deodorants the morning of surgery.  Please wear clean clothes to the hospital/surgery center.  FAILURE TO FOLLOW THESE INSTRUCTIONS MAY RESULT IN THE CANCELLATION OF YOUR SURGERY  PATIENT SIGNATURE_________________________________  NURSE SIGNATURE__________________________________  ________________________________________________________________________

## 2021-01-16 NOTE — Progress Notes (Signed)
COVID Vaccine Completed: x1 Date COVID Vaccine completed:  04-03-20 COVID vaccine manufacturer: [employer]   PCP - Sharilyn Sites, MD Cardiologist -   Chest x-ray -  EKG - 03-09-20 in Epic Stress Test -  ECHO - 2011 in Epic Cardiac Cath -  Pacemaker/ICD device last checked:  Sleep Study -  CPAP -   Fasting Blood Sugar -  Checks Blood Sugar _____ times a day  Blood Thinner Instructions: Aspirin Instructions: Last Dose:  Anesthesia review:   Patient denies shortness of breath, fever, cough and chest pain at PAT appointment   Patient verbalized understanding of instructions that were given to them at the PAT appointment. Patient was also instructed that they will need to review over the PAT instructions again at home before surgery.

## 2021-01-17 ENCOUNTER — Encounter (HOSPITAL_COMMUNITY)
Admission: RE | Admit: 2021-01-17 | Discharge: 2021-01-17 | Disposition: A | Discharge status: Home / Self Care | Payer: Medicare PPO | Source: Ambulatory Visit | Attending: Urology | Admitting: Urology

## 2021-01-17 ENCOUNTER — Other Ambulatory Visit: Payer: Self-pay

## 2021-01-17 ENCOUNTER — Other Ambulatory Visit (HOSPITAL_COMMUNITY)
Admission: RE | Admit: 2021-01-17 | Discharge: 2021-01-17 | Disposition: A | Discharge status: Home / Self Care | Payer: Medicare PPO | Source: Ambulatory Visit | Attending: Urology | Admitting: Urology

## 2021-01-17 ENCOUNTER — Encounter (HOSPITAL_COMMUNITY): Payer: Self-pay

## 2021-01-17 DIAGNOSIS — Z20822 Contact with and (suspected) exposure to covid-19: Secondary | ICD-10-CM | POA: Diagnosis not present

## 2021-01-17 DIAGNOSIS — Z01818 Encounter for other preprocedural examination: Secondary | ICD-10-CM | POA: Insufficient documentation

## 2021-01-17 HISTORY — DX: Family history of other specified conditions: Z84.89

## 2021-01-17 LAB — HEMOGLOBIN A1C
Hgb A1c MFr Bld: 7.9 % — ABNORMAL HIGH (ref 4.8–5.6)
Mean Plasma Glucose: 180.03 mg/dL

## 2021-01-17 LAB — CBC
HCT: 40.9 % (ref 36.0–46.0)
Hemoglobin: 13.5 g/dL (ref 12.0–15.0)
MCH: 29.9 pg (ref 26.0–34.0)
MCHC: 33 g/dL (ref 30.0–36.0)
MCV: 90.5 fL (ref 80.0–100.0)
Platelets: 258 10*3/uL (ref 150–400)
RBC: 4.52 MIL/uL (ref 3.87–5.11)
RDW: 13 % (ref 11.5–15.5)
WBC: 8.8 10*3/uL (ref 4.0–10.5)
nRBC: 0 % (ref 0.0–0.2)

## 2021-01-17 LAB — BASIC METABOLIC PANEL
Anion gap: 12 (ref 5–15)
BUN: 27 mg/dL — ABNORMAL HIGH (ref 8–23)
CO2: 27 mmol/L (ref 22–32)
Calcium: 9.8 mg/dL (ref 8.9–10.3)
Chloride: 100 mmol/L (ref 98–111)
Creatinine, Ser: 1.02 mg/dL — ABNORMAL HIGH (ref 0.44–1.00)
GFR, Estimated: 58 mL/min — ABNORMAL LOW (ref >60)
Glucose, Bld: 267 mg/dL — ABNORMAL HIGH (ref 70–99)
Potassium: 3.6 mmol/L (ref 3.5–5.1)
Sodium: 139 mmol/L (ref 135–145)

## 2021-01-17 LAB — SARS CORONAVIRUS 2 (TAT 6-24 HRS): SARS Coronavirus 2: NEGATIVE

## 2021-01-17 LAB — GLUCOSE, CAPILLARY: Glucose-Capillary: 256 mg/dL — ABNORMAL HIGH (ref 70–99)

## 2021-01-17 NOTE — Progress Notes (Signed)
Pt checks her cbg's 1 time per day; average range 225-300 since last surgery. Spoke with Janett Billow PA with anesthesia in regards to cbg's. Discussed with pt importance of food choices and discussing blood sugars with primary.

## 2021-01-18 NOTE — H&P (Signed)
Office Visit Report     01/08/2021   --------------------------------------------------------------------------------   Victoria Andrade  MRN: [id number]  DOB: [birth year], 75 year old Female  SSN: [id number]   PRIMARY CARE:  Victoria Sites, MD  REFERRING:  Victoria Gravel, NP  PROVIDER:  Raynelle Andrade, M.D.  LOCATION:  [facility] [phone]     --------------------------------------------------------------------------------   CC/HPI: Urolithiasis   Victoria Andrade returns today after having undergone ureteroscopic laser lithotripsy in late December. She has large burden left renal calculi but has not wanted to proceed with percutaneous nephrolithotomy. She has therefore been undergoing staged ureteroscopic procedures. She initially underwent procedure November to treat a ureteral stone but her renal calculi were left untreated due to their large burden and likely need for nephrolithotomy. She had migration of her stone resulting in obstruction and requiring more urgent intervention. Ultimately, after discussing options, she did elect to proceed with staged ureteroscopy rather than PCNL. She has done relatively well following her recent procedure. She does have expected stent symptoms with urinary frequency and urgency. She denies significant gross hematuria. She has some intermittent flank pain. She has been using tramadol prn.     ALLERGIES: Adhesive tape Ciprofloxacin HCl TABS - Skin Rash Keflex TABS - Skin Rash Latex - Trouble Breathing, Swelling Penicillins - Skin Rash PredniSONE TABS Sulfa Drugs - Skin Rash Tamsulosin HCl CAPS - Other Reaction, hair loss    MEDICATIONS: Amerge 2.5 mg tablet Oral  Benadryl TABS Oral  Claritin TABS Oral  Duloxetine Hcl  Hydrochlorothiazide 25 mg tablet 1 Oral  Januvia  Linzess 145 mcg capsule 1 capsule PO Daily  Miralax  Montelukast Sodium 10 mg tablet Oral  Stool Softener  Vitamin D-3 5000 UNIT TABS Oral  Xanax 1 mg tablet Oral      GU PSH: Cysto Remove Stent FB Sim - 11/06/2020 ESWL - 2017, 2016, 2013, 2013 Hysterectomy Unilat SO - 2013 Percut Stone Removal >2cm, Right - 03/15/2020 Ureteroscopic laser litho, Left - 12/17/2020, Left - 10/29/2020     NON-GU PSH: Cataract surgery, Bilateral Cataract Surgery.., Bilateral Cholecystectomy (laparoscopic) Remove Tonsils - 2008 Shoulder Joint Surgery, Left - 2018 Tubal Ligation - 2008     GU PMH: Renal calculus - 12/10/2020, - 11/06/2020, - 10/03/2020, Nephrolithiasis, - 2017 Ureteral calculus - 12/10/2020, - 11/06/2020 (Stable), - 10/26/2020, Calculus of ureter, - 2014 Flank Pain (Worsening, Chronic), Right, Culture urine. No ABX unless culture proven UTI. Reassured no acute finding today today explain flank pain. Recommend she alternate Tylenol/NSAID and heat/ice for pain. If pain persist or worsens may need CT urogram. - 2019 Unspecified condition associated with female genital organs and menstrual cycle, Adnexal mass - 2016 Other microscopic hematuria, Microscopic hematuria - 2015      PMH Notes:   1) Urolithiasis: She has a history of calcium oxalate urolithiasis. She has a history of low urine volume and hyperoxaluria.   Current treatment: Fluid hydration, calcium citrate, low oxalate diet, high citrate diet  Prior treatment: HCTZ (unable to tolerate)   1995: ESWL  Apr 2013: ESWL 8 mm left ureteral stone  June 2013: ESWL of multiple left renal calculi  Aug 2013: 24 hr urine - low urine volume, hyperoxaluria  Feb 2014: 24 hr urine - Stone risk significantly reduced  Apr 2015: 24 hr urine - Calcium and oxalate increased  Dec 2015: 24 hr urine - Hypercalciuria and high urine sodium - increased HCTZ to 25 mg q am and 12.5 mg q pm, reduce dietary  sodium  Jul 2016: 24 hr urine - Hyeroxaluria and hypocitraturia - not compliant with calcium treatment -- plan to increase calcium supplement compliance  Dec 2016: ESWL of right renal calculus  Mar 2017: ESWL of left  renal calculus  Mar 2021: R PCNL  May 2021: 24 hr urine - Low urine volume, hyperoxaluria, hypocitraturia  Nov 2021: Left ureteroscopic laser lithotripsy (calcium oxalate monohydrate mostly)  Dec 2021: Left ureteroscopic laser lithotripsy (migration of renal calculi), discussed PCNL but patient elected staged ureteroscopic treatment     NON-GU PMH: Anxiety, Anxiety - 2014 Irritable bowel syndrome with diarrhea, Irritable Bowel Syndrome - 2014 GERD Hypertension    FAMILY HISTORY: Acute Myocardial Infarction - Mother, Runs In Family Chronic Obstructive Pulmonary Disease - Mother Diabetes - Runs In Family Ischemic Stroke - Mother   SOCIAL HISTORY: Marital Status: Married Preferred Language: English; Ethnicity: Not Hispanic Or Latino; Race: White Current Smoking Status: Patient has never smoked.  Does not use smokeless tobacco. Types of alcohol consumed: Wine. Social Drinker.  Does not use drugs. Does not drink caffeine. Has not had a blood transfusion.    REVIEW OF SYSTEMS:    GU Review Female:   Patient denies frequent urination, hard to postpone urination, burning /pain with urination, get up at night to urinate, leakage of urine, stream starts and stops, trouble starting your stream, have to strain to urinate, and currently pregnant.  Gastrointestinal (Upper):   Patient denies nausea and vomiting.  Gastrointestinal (Lower):   Patient denies diarrhea and constipation.  Constitutional:   Patient denies fever, night sweats, weight loss, and fatigue.  Skin:   Patient denies skin rash/ lesion and itching.  Eyes:   Patient denies blurred vision and double vision.  Ears/ Nose/ Throat:   Patient denies sore throat and sinus problems.  Hematologic/Lymphatic:   Patient denies swollen glands and easy bruising.  Cardiovascular:   Patient denies leg swelling and chest pains.  Respiratory:   Patient denies cough and shortness of breath.  Endocrine:   Patient denies excessive thirst.   Musculoskeletal:   Patient denies back pain and joint pain.  Neurological:   Patient denies dizziness and headaches.  Psychologic:   Patient denies depression and anxiety.   VITAL SIGNS:      01/08/2021 10:49 AM  Weight 182 lb / 82.55 kg  Height 61 in / 154.94 cm  BP 128/74 mmHg  Pulse 89 /min  Temperature 97.1 F / 36.1 C  BMI 34.4 kg/m   MULTI-SYSTEM PHYSICAL EXAMINATION:    Constitutional: Well-nourished. No physical deformities. Normally developed. Good grooming.  Respiratory: No labored breathing, no use of accessory muscles. Clear bilaterally.  Cardiovascular: Normal temperature, normal extremity pulses, no swelling, no varicosities. Regular rate and rhythm.     Complexity of Data:  Lab Test Review:   Stone Analysis  Records Review:   Previous Patient Records  X-Ray Review: KUB: Reviewed Films.    Notes:                     I independently reviewed her KUB. This demonstrates her left ureteral stent to be in appropriate position. Her renal stone burden is significantly improved compared to her baseline KUB prior to ureteroscopy. She appears to have 1 small 6 mm fragment toward the lower pole of the left kidney. There also may be some scattered smaller fragments. The larger stones are no longer visualized.   PROCEDURES:         KUB - [id number]  A single view of the abdomen is obtained.      Patient confirmed No Neulasta OnPro Device.           Urinalysis w/Scope - 81001 Dipstick Dipstick Cont'd Micro  Color: Yellow Bilirubin: Neg WBC/hpf: 6 - 10/hpf  Appearance: Cloudy Ketones: Neg RBC/hpf: 40 - 60/hpf  Specific Gravity: 1.025 Blood: 3+ Bacteria: Mod (26-50/hpf)  pH: 6.0 Protein: 2+ Cystals: NS (Not Seen)  Glucose: Neg Urobilinogen: 0.2 Casts: NS (Not Seen)    Nitrites: Neg Trichomonas: Not Present    Leukocyte Esterase: 2+ Mucous: Not Present      Epithelial Cells: 0 - 5/hpf      Yeast: NS (Not Seen)      Sperm: Not Present    Notes:  Unspun micro due to  quantity     ASSESSMENT:      ICD-10 Details  1 GU:   Renal calculus - N20.0    PLAN:           Orders Labs Urine Culture          Schedule Return Visit/Planned Activity: Keep Scheduled Appointment          Document Letter(s):  Created for Patient: Clinical Summary         Notes:   1. Left renal calculi: Her stone burden is significantly reduced. However, we discussed proceeding with a completion second-stage procedure. Her urine will be cultured today and she will be treated with appropriate preoperative antibiotics. She will then proceed with repeat second-stage left ureteroscopic laser lithotripsy and stone removal as indicated.   Cc: Dr. Sharilyn Andrade        Next Appointment:      Next Appointment: 01/21/2021 12:00 PM    Appointment Type: Surgery     Location: [facility] [phone]    Provider: Raynelle Andrade, M.D.    Reason for Visit: WL/OP CYSTO, LT URS LASER LITHO, LT UR STENT PLACEMENT      * Signed by Victoria Andrade, M.D. on 01/08/21 at 3:35 PM (EST)*

## 2021-01-20 MED ORDER — GENTAMICIN SULFATE 40 MG/ML IJ SOLN
5.0000 mg/kg | INTRAVENOUS | Status: AC
  Administered 2021-01-21: 310 mg via INTRAVENOUS
  Filled 2021-01-20: qty 7.75

## 2021-01-21 ENCOUNTER — Encounter (HOSPITAL_COMMUNITY): Payer: Self-pay | Admitting: Urology

## 2021-01-21 ENCOUNTER — Ambulatory Visit (HOSPITAL_COMMUNITY)
Admission: RE | Admit: 2021-01-21 | Discharge: 2021-01-21 | Disposition: A | Discharge status: Home / Self Care | Payer: Medicare PPO | Attending: Urology | Admitting: Urology

## 2021-01-21 ENCOUNTER — Ambulatory Visit (HOSPITAL_COMMUNITY): Payer: Medicare PPO | Admitting: Certified Registered Nurse Anesthetist

## 2021-01-21 ENCOUNTER — Encounter (HOSPITAL_COMMUNITY)
Admission: RE | Disposition: A | Discharge status: Home / Self Care | Payer: Self-pay | Source: Home / Self Care | Attending: Urology

## 2021-01-21 ENCOUNTER — Ambulatory Visit (HOSPITAL_COMMUNITY): Payer: Medicare PPO | Admitting: Physician Assistant

## 2021-01-21 ENCOUNTER — Ambulatory Visit (HOSPITAL_COMMUNITY): Payer: Medicare PPO

## 2021-01-21 DIAGNOSIS — N200 Calculus of kidney: Secondary | ICD-10-CM | POA: Diagnosis not present

## 2021-01-21 DIAGNOSIS — Z79899 Other long term (current) drug therapy: Secondary | ICD-10-CM | POA: Insufficient documentation

## 2021-01-21 DIAGNOSIS — N202 Calculus of kidney with calculus of ureter: Secondary | ICD-10-CM | POA: Insufficient documentation

## 2021-01-21 DIAGNOSIS — Z881 Allergy status to other antibiotic agents status: Secondary | ICD-10-CM | POA: Insufficient documentation

## 2021-01-21 DIAGNOSIS — Z91040 Latex allergy status: Secondary | ICD-10-CM | POA: Diagnosis not present

## 2021-01-21 DIAGNOSIS — Z880 Allergy status to penicillin: Secondary | ICD-10-CM | POA: Insufficient documentation

## 2021-01-21 DIAGNOSIS — I10 Essential (primary) hypertension: Secondary | ICD-10-CM | POA: Diagnosis not present

## 2021-01-21 DIAGNOSIS — Z888 Allergy status to other drugs, medicaments and biological substances status: Secondary | ICD-10-CM | POA: Diagnosis not present

## 2021-01-21 DIAGNOSIS — Z882 Allergy status to sulfonamides status: Secondary | ICD-10-CM | POA: Insufficient documentation

## 2021-01-21 DIAGNOSIS — F419 Anxiety disorder, unspecified: Secondary | ICD-10-CM | POA: Diagnosis not present

## 2021-01-21 DIAGNOSIS — K219 Gastro-esophageal reflux disease without esophagitis: Secondary | ICD-10-CM | POA: Diagnosis not present

## 2021-01-21 HISTORY — PX: CYSTOSCOPY/URETEROSCOPY/HOLMIUM LASER/STENT PLACEMENT: SHX6546

## 2021-01-21 LAB — GLUCOSE, CAPILLARY
Glucose-Capillary: 156 mg/dL — ABNORMAL HIGH (ref 70–99)
Glucose-Capillary: 143 mg/dL — ABNORMAL HIGH (ref 70–99)

## 2021-01-21 SURGERY — CYSTOSCOPY/URETEROSCOPY/HOLMIUM LASER/STENT PLACEMENT
Anesthesia: General | Site: Ureter | Laterality: Left

## 2021-01-21 MED ORDER — ACETAMINOPHEN 10 MG/ML IV SOLN: 1000.0000 mg | Freq: Once | INTRAVENOUS | Status: DC | PRN

## 2021-01-21 MED ORDER — 0.9 % SODIUM CHLORIDE (POUR BTL) OPTIME
TOPICAL | Status: DC | PRN
  Administered 2021-01-21: 1000 mL

## 2021-01-21 MED ORDER — ONDANSETRON HCL 4 MG/2ML IJ SOLN
INTRAMUSCULAR | Status: DC | PRN
  Administered 2021-01-21: 4 mg via INTRAVENOUS

## 2021-01-21 MED ORDER — ORAL CARE MOUTH RINSE: 15.0000 mL | Freq: Once | OROMUCOSAL | Status: AC

## 2021-01-21 MED ORDER — SCOPOLAMINE 1 MG/3DAYS TD PT72
1.0000 | MEDICATED_PATCH | TRANSDERMAL | Status: DC
  Administered 2021-01-21: 1 via TRANSDERMAL

## 2021-01-21 MED ORDER — ONDANSETRON HCL 4 MG/2ML IJ SOLN
INTRAMUSCULAR | Status: AC
  Filled 2021-01-21: qty 2

## 2021-01-21 MED ORDER — FENTANYL CITRATE (PF) 100 MCG/2ML IJ SOLN
INTRAMUSCULAR | Status: DC | PRN
  Administered 2021-01-21: 25 ug via INTRAVENOUS

## 2021-01-21 MED ORDER — CHLORHEXIDINE GLUCONATE 0.12 % MT SOLN
15.0000 mL | Freq: Once | OROMUCOSAL | Status: AC
  Administered 2021-01-21: 15 mL via OROMUCOSAL

## 2021-01-21 MED ORDER — SCOPOLAMINE 1 MG/3DAYS TD PT72
MEDICATED_PATCH | TRANSDERMAL | Status: AC
  Filled 2021-01-21: qty 1

## 2021-01-21 MED ORDER — FENTANYL CITRATE (PF) 100 MCG/2ML IJ SOLN
INTRAMUSCULAR | Status: AC
  Filled 2021-01-21: qty 2

## 2021-01-21 MED ORDER — TRAMADOL HCL 50 MG PO TABS: 50.0000 mg | ORAL_TABLET | Freq: Four times a day (QID) | ORAL | 0 refills | Status: DC | PRN

## 2021-01-21 MED ORDER — SODIUM CHLORIDE 0.9 % IR SOLN
Status: DC | PRN
  Administered 2021-01-21: 6000 mL

## 2021-01-21 MED ORDER — LIDOCAINE 2% (20 MG/ML) 5 ML SYRINGE
INTRAMUSCULAR | Status: DC | PRN
  Administered 2021-01-21: 100 mg via INTRAVENOUS

## 2021-01-21 MED ORDER — ONDANSETRON HCL 4 MG/2ML IJ SOLN: 4.0000 mg | Freq: Once | INTRAMUSCULAR | Status: DC | PRN

## 2021-01-21 MED ORDER — FENTANYL CITRATE (PF) 100 MCG/2ML IJ SOLN: 25.0000 ug | INTRAMUSCULAR | Status: DC | PRN

## 2021-01-21 MED ORDER — IOHEXOL 300 MG/ML  SOLN
INTRAMUSCULAR | Status: DC | PRN
  Administered 2021-01-21: 10 mL

## 2021-01-21 MED ORDER — PROPOFOL 10 MG/ML IV BOLUS
INTRAVENOUS | Status: DC | PRN
  Administered 2021-01-21 (×2): 100 mg via INTRAVENOUS

## 2021-01-21 MED ORDER — LACTATED RINGERS IV SOLN: INTRAVENOUS | Status: DC

## 2021-01-21 SURGICAL SUPPLY — 21 items
BAG URO CATCHER STRL LF (MISCELLANEOUS) ×2 IMPLANT
BASKET ZERO TIP NITINOL 2.4FR (BASKET) ×1 IMPLANT
BSKT STON RTRVL ZERO TP 2.4FR (BASKET) ×1
CATH INTERMIT  6FR 70CM (CATHETERS) ×1 IMPLANT
CLOTH BEACON ORANGE TIMEOUT ST (SAFETY) ×2 IMPLANT
GLOVE SURG ENC TEXT LTX SZ7.5 (GLOVE) ×2 IMPLANT
GOWN STRL REUS W/TWL LRG LVL3 (GOWN DISPOSABLE) ×2 IMPLANT
GUIDEWIRE STR DUAL SENSOR (WIRE) ×2 IMPLANT
GUIDEWIRE ZIPWRE .038 STRAIGHT (WIRE) IMPLANT
IV NS 1000ML (IV SOLUTION) ×2
IV NS 1000ML BAXH (IV SOLUTION) ×1 IMPLANT
KIT TURNOVER KIT A (KITS) IMPLANT
LASER FIB FLEXIVA PULSE ID 365 (Laser) IMPLANT
MANIFOLD NEPTUNE II (INSTRUMENTS) ×2 IMPLANT
PACK CYSTO (CUSTOM PROCEDURE TRAY) ×2 IMPLANT
SHEATH URETERAL 12FRX35CM (MISCELLANEOUS) ×1 IMPLANT
STENT URET 6FRX24 CONTOUR (STENTS) ×1 IMPLANT
TRACTIP FLEXIVA PULS ID 200XHI (Laser) IMPLANT
TRACTIP FLEXIVA PULSE ID 200 (Laser) ×2
TUBING CONNECTING 10 (TUBING) ×2 IMPLANT
TUBING UROLOGY SET (TUBING) ×1 IMPLANT

## 2021-01-21 NOTE — Anesthesia Procedure Notes (Signed)
Procedure Name: LMA Insertion Date/Time: 01/21/2021 11:43 AM Performed by: Niel Hummer, CRNA Pre-anesthesia Checklist: Patient identified, Emergency Drugs available, Suction available and Patient being monitored Patient Re-evaluated:Patient Re-evaluated prior to induction Oxygen Delivery Method: Circle system utilized Preoxygenation: Pre-oxygenation with 100% oxygen Induction Type: IV induction Ventilation: Mask ventilation without difficulty LMA: LMA inserted LMA Size: 4.0 Number of attempts: 1 Dental Injury: Teeth and Oropharynx as per pre-operative assessment

## 2021-01-21 NOTE — Progress Notes (Signed)
Patient fell while getting dressed to go home. Patient states bumped her head and buttocks when she fell. Upon assessment no signs of any trauma on head or buttocks. Patient states slightly sore. VSS. BP: 146/89. P: 98. O2 96% on RA. [place] notified Dr [name]. See progress note for [place]'s response.

## 2021-01-21 NOTE — Anesthesia Postprocedure Evaluation (Signed)
Anesthesia Post Note  Patient: Victoria Andrade  Procedure(s) Performed: CYSTOSCOPY/RETROGRADE/URETEROSCOPY/HOLMIUM LASER/STENT PLACEMENT (Left Ureter)     Patient location during evaluation: PACU Anesthesia Type: General Level of consciousness: awake Pain management: pain level controlled Vital Signs Assessment: post-procedure vital signs reviewed and stable Respiratory status: spontaneous breathing Cardiovascular status: stable Postop Assessment: no apparent nausea or vomiting Anesthetic complications: no   No complications documented.  Last Vitals:  Vitals:   01/21/21 1330 01/21/21 1345  BP: (!) 148/69 (!) 142/57  Pulse: 68 (P) 70  Resp: 12 14  Temp: 36.4 C 36.4 C  SpO2: 96% 94%    Last Pain:  Vitals:   01/21/21 1345  TempSrc:   PainSc: 0-No pain   Pain Goal: Patients Stated Pain Goal: 3 (01/21/21 1010)                 Huston Foley

## 2021-01-21 NOTE — Transfer of Care (Signed)
Immediate Anesthesia Transfer of Care Note  Patient: Victoria Andrade  Procedure(s) Performed: CYSTOSCOPY/URETEROSCOPY/HOLMIUM LASER/STENT PLACEMENT (Left )  Patient Location: PACU  Anesthesia Type:General  Level of Consciousness: awake, alert  and oriented  Airway & Oxygen Therapy: Patient Spontanous Breathing and Patient connected to face mask oxygen  Post-op Assessment: Report given to RN, Post -op Vital signs reviewed and stable and Patient moving all extremities X 4  Post vital signs: Reviewed and stable  Last Vitals:  Vitals Value Taken Time  BP 165/74 01/21/21 1247  Temp    Pulse 65 01/21/21 1249  Resp 11 01/21/21 1249  SpO2 100 % 01/21/21 1249  Vitals shown include unvalidated device data.  Last Pain:  Vitals:   01/21/21 1010  TempSrc:   PainSc: 0-No pain      Patients Stated Pain Goal: 3 (22/02/54 2706)  Complications: No complications documented.

## 2021-01-21 NOTE — Op Note (Signed)
Preoperative diagnosis: Left renal calculi  Postoperative diagnosis: Left renal calculi  Procedures: 1.  Cystoscopy 2.  Left retrograde pyelography with interpretation 3.  Left ureteroscopy with laser lithotripsy and stone removal 4.  Left ureteral stent placement (6 x 24-with string)  Surgeon: Pryor Curia MD  Anesthesia: General  Complications: None  EBL: Minimal  Specimen: Left ureteral and renal calculi  Disposition of specimen: [facility]  Intraoperative findings: Left retrograde pyelography was performed with Omnipaque contrast and a 6 French ureteral catheter.  This demonstrated multiple filling defects within the ureter as well as within the interpolar and lower pole of the left renal collecting system consistent with possible stone fragments.  Indication: Victoria Andrade is a 75 year old female with a history of recurrent urolithiasis.  She presents today for second stage left ureteroscopic laser lithotripsy.  The potential risks, complications, and the expected recovery process associated with the above procedures was discussed in detail.  Informed consent was obtained.  Description of procedure: The patient was taken the operating room and a general anesthetic was administered.  She was given preoperative antibiotics, placed in the dorsolithotomy position, and prepped and draped in the usual sterile fashion.  Next, a preoperative timeout was performed.  Cystourethroscopy was then performed and the patient's indwelling stent was identified.  It was severely encrusted.  This was brought out to the urethral meatus with flexible graspers.  Attempts to place a guidewire through the stent were unsuccessful due to encrustation.  The stent was therefore removed and a new 6 French ureteral catheter was used to perform retrograde pyelography.  This did demonstrate multiple filling defects within the ureter concerning for ureteral stones.  A 0.038 sensor guidewire was then  inserted into the left ureter and up into the renal collecting system under fluoroscopic guidance.  Semirigid ureteroscopy was then performed.  Multiple small calculi were identified and were removed with a 0 tip nitinol basket.  Once the ureter was confirmed to be completely clear of any residual stones, a 12/14 ureteral access sheath was placed over the wire up to the proximal ureter.  Digital flexible ureteroscopy was then performed and 2 remaining calculi of significant size were identified.  These were lasered with a 200 m holmium laser fiber on a setting of 0.6 J and 6 Hz.  Once the stones were fragmented adequately, any sizable fragments were removed with a nitinol basket.  All remaining fragments were identified to be 2 mm or less in size.  The wire was left in place and the ureteral access sheath was removed.  The wire was then backloaded on the cystoscope and a 6 x 24 double-J ureteral stent was advanced over the wire using Seldinger technique and positioned appropriately under fluoroscopic and cystoscopic guidance.  A string tether was left in place.  The wire was removed with a good curl noted in the renal pelvis as well as within the bladder.  The patient tolerated the procedure well without complications.  She was able to be awakened and transferred to recovery unit in satisfactory condition.

## 2021-01-21 NOTE — Interval H&P Note (Signed)
History and Physical Interval Note:  01/21/2021 10:50 AM  Victoria Andrade  has presented today for surgery, with the diagnosis of LEFT RENAL CALCULI.  The various methods of treatment have been discussed with the patient and family. After consideration of risks, benefits and other options for treatment, the patient has consented to  Procedure(s): CYSTOSCOPY/URETEROSCOPY/HOLMIUM LASER/STENT PLACEMENT (Left) as a surgical intervention.  The patient's history has been reviewed, patient examined, no change in status, stable for surgery.  I have reviewed the patient's chart and labs.  Questions were answered to the patient's satisfaction.     [employer]

## 2021-01-21 NOTE — Progress Notes (Signed)
Charge nurse alerted to patient falling while getting dressed.  Pt stated she hit her head and buttocks upon fall.  Pt evaluated with VSS (see earlier progress note) and assessed for trauma.  Patient states pain is minor with a slightly sore buttock.  Dr. [name] called and informed of fall.  He stated if patient is feeling fine may go home, otherwise to get evaluated in ED.  Pt states she felt fine and would like to go home.  Wheeled out to car by myself.  Informed her neighbor June who will be staying with her tonight of the fall and to watch for any adverse side effects and bring her to the ED if feels necessary.

## 2021-01-21 NOTE — Discharge Instructions (Addendum)
1. You may see some blood in the urine and may have some burning with urination for 48-72 hours. You also may notice that you have to urinate more frequently or urgently after your procedure which is normal.  2. You should call should you develop an inability urinate, fever > 101, persistent nausea and vomiting that prevents you from eating or drinking to stay hydrated.  3. If you have a stent, you will likely urinate more frequently and urgently until the stent is removed and you may experience some discomfort/pain in the lower abdomen and flank especially when urinating. You may take pain medication prescribed to you if needed for pain. You may also intermittently have blood in the urine until the stent is removed. You may remove your stent on Friday morning.  Simply pull the string that is taped to your body and the stent will easily come out.  This may be best done in the shower as some urine may come out with the stent.  Usually you will feel relief once the stent is removed, but occasionally patients can develop pain due to residual swelling of the ureter that may temporarily obstruct the kidney.  This can be managed by taking pain medication and it will typically resolve with time.  Please do not hesitate to call if you have pain that is not controlled with your pain medication or does not improved within 24-48 hours. 

## 2021-01-21 NOTE — Anesthesia Preprocedure Evaluation (Addendum)
Anesthesia Evaluation  Patient identified by MRN, date of birth, ID band Patient awake    Reviewed: Allergy & Precautions, NPO status , Patient's Chart, lab work & pertinent test results  History of Anesthesia Complications (+) PONV  Airway Mallampati: I  TM Distance: >3 FB Neck ROM: Full    Dental no notable dental hx. (+) Teeth Intact, Dental Advisory Given   Pulmonary neg pulmonary ROS,    Pulmonary exam normal breath sounds clear to auscultation       Cardiovascular hypertension, Pt. on medications Normal cardiovascular exam Rhythm:Regular Rate:Normal     Neuro/Psych  Headaches, Anxiety  Neuromuscular disease    GI/Hepatic hiatal hernia, GERD  ,  Endo/Other  diabetes, Well Controlled, Type 2  Renal/GU Renal diseasenephrolithisis K 3.6 Cr 1.02     Musculoskeletal  (+) Arthritis , Fibromyalgia -  Abdominal (+) + obese,   Peds  Hematology Hgb 13.5   Anesthesia Other Findings   Reproductive/Obstetrics                             Anesthesia Physical Anesthesia Plan  ASA: II  Anesthesia Plan: General   Post-op Pain Management:    Induction: Intravenous  PONV Risk Score and Plan: 4 or greater and Treatment may vary due to age or medical condition, Ondansetron, Scopolamine patch - Pre-op and Dexamethasone  Airway Management Planned: LMA  Additional Equipment: None  Intra-op Plan:   Post-operative Plan:   Informed Consent: I have reviewed the patients History and Physical, chart, labs and discussed the procedure including the risks, benefits and alternatives for the proposed anesthesia with the patient or authorized representative who has indicated his/her understanding and acceptance.     Dental advisory given  Plan Discussed with: CRNA and Anesthesiologist  Anesthesia Plan Comments:         Anesthesia Quick Evaluation

## 2021-01-22 ENCOUNTER — Encounter (HOSPITAL_COMMUNITY): Payer: Self-pay | Admitting: Urology

## 2021-01-22 DIAGNOSIS — N200 Calculus of kidney: Secondary | ICD-10-CM | POA: Diagnosis not present

## 2021-01-31 DIAGNOSIS — M533 Sacrococcygeal disorders, not elsewhere classified: Secondary | ICD-10-CM | POA: Diagnosis not present

## 2021-02-13 DIAGNOSIS — L820 Inflamed seborrheic keratosis: Secondary | ICD-10-CM | POA: Diagnosis not present

## 2021-02-26 DIAGNOSIS — N200 Calculus of kidney: Secondary | ICD-10-CM | POA: Diagnosis not present

## 2021-03-11 DIAGNOSIS — H26492 Other secondary cataract, left eye: Secondary | ICD-10-CM | POA: Diagnosis not present

## 2021-03-14 DIAGNOSIS — E559 Vitamin D deficiency, unspecified: Secondary | ICD-10-CM | POA: Diagnosis not present

## 2021-03-14 DIAGNOSIS — E119 Type 2 diabetes mellitus without complications: Secondary | ICD-10-CM | POA: Diagnosis not present

## 2021-03-14 DIAGNOSIS — Z6834 Body mass index (BMI) 34.0-34.9, adult: Secondary | ICD-10-CM | POA: Diagnosis not present

## 2021-03-14 DIAGNOSIS — Z0001 Encounter for general adult medical examination with abnormal findings: Secondary | ICD-10-CM | POA: Diagnosis not present

## 2021-03-14 DIAGNOSIS — F329 Major depressive disorder, single episode, unspecified: Secondary | ICD-10-CM | POA: Diagnosis not present

## 2021-03-14 DIAGNOSIS — I10 Essential (primary) hypertension: Secondary | ICD-10-CM | POA: Diagnosis not present

## 2021-03-14 DIAGNOSIS — E118 Type 2 diabetes mellitus with unspecified complications: Secondary | ICD-10-CM | POA: Diagnosis not present

## 2021-03-14 DIAGNOSIS — Z1389 Encounter for screening for other disorder: Secondary | ICD-10-CM | POA: Diagnosis not present

## 2021-03-14 DIAGNOSIS — E278 Other specified disorders of adrenal gland: Secondary | ICD-10-CM | POA: Diagnosis not present

## 2021-03-14 DIAGNOSIS — E7849 Other hyperlipidemia: Secondary | ICD-10-CM | POA: Diagnosis not present

## 2021-04-01 DIAGNOSIS — H40013 Open angle with borderline findings, low risk, bilateral: Secondary | ICD-10-CM | POA: Diagnosis not present

## 2021-04-01 DIAGNOSIS — H5702 Anisocoria: Secondary | ICD-10-CM | POA: Diagnosis not present

## 2021-04-02 DIAGNOSIS — L820 Inflamed seborrheic keratosis: Secondary | ICD-10-CM | POA: Diagnosis not present

## 2021-04-02 DIAGNOSIS — L738 Other specified follicular disorders: Secondary | ICD-10-CM | POA: Diagnosis not present

## 2021-04-02 DIAGNOSIS — D485 Neoplasm of uncertain behavior of skin: Secondary | ICD-10-CM | POA: Diagnosis not present

## 2021-04-03 DIAGNOSIS — E1165 Type 2 diabetes mellitus with hyperglycemia: Secondary | ICD-10-CM | POA: Diagnosis not present

## 2021-04-03 DIAGNOSIS — E1169 Type 2 diabetes mellitus with other specified complication: Secondary | ICD-10-CM | POA: Diagnosis not present

## 2021-04-03 DIAGNOSIS — N200 Calculus of kidney: Secondary | ICD-10-CM | POA: Diagnosis not present

## 2021-04-03 DIAGNOSIS — Z6835 Body mass index (BMI) 35.0-35.9, adult: Secondary | ICD-10-CM | POA: Diagnosis not present

## 2021-04-03 DIAGNOSIS — R609 Edema, unspecified: Secondary | ICD-10-CM | POA: Diagnosis not present

## 2021-04-03 DIAGNOSIS — Z634 Disappearance and death of family member: Secondary | ICD-10-CM | POA: Diagnosis not present

## 2021-04-03 DIAGNOSIS — E041 Nontoxic single thyroid nodule: Secondary | ICD-10-CM | POA: Diagnosis not present

## 2021-04-30 DIAGNOSIS — N200 Calculus of kidney: Secondary | ICD-10-CM | POA: Diagnosis not present

## 2021-04-30 DIAGNOSIS — Z6834 Body mass index (BMI) 34.0-34.9, adult: Secondary | ICD-10-CM | POA: Diagnosis not present

## 2021-04-30 DIAGNOSIS — R609 Edema, unspecified: Secondary | ICD-10-CM | POA: Diagnosis not present

## 2021-04-30 DIAGNOSIS — Z634 Disappearance and death of family member: Secondary | ICD-10-CM | POA: Diagnosis not present

## 2021-04-30 DIAGNOSIS — E1169 Type 2 diabetes mellitus with other specified complication: Secondary | ICD-10-CM | POA: Diagnosis not present

## 2021-04-30 DIAGNOSIS — E041 Nontoxic single thyroid nodule: Secondary | ICD-10-CM | POA: Diagnosis not present

## 2021-04-30 DIAGNOSIS — E1165 Type 2 diabetes mellitus with hyperglycemia: Secondary | ICD-10-CM | POA: Diagnosis not present

## 2021-05-01 DIAGNOSIS — L245 Irritant contact dermatitis due to other chemical products: Secondary | ICD-10-CM | POA: Diagnosis not present

## 2021-05-01 DIAGNOSIS — B029 Zoster without complications: Secondary | ICD-10-CM | POA: Diagnosis not present

## 2021-05-17 DIAGNOSIS — H53452 Other localized visual field defect, left eye: Secondary | ICD-10-CM | POA: Diagnosis not present

## 2021-05-21 ENCOUNTER — Other Ambulatory Visit: Payer: Self-pay | Admitting: Family Medicine

## 2021-05-21 DIAGNOSIS — Z1231 Encounter for screening mammogram for malignant neoplasm of breast: Secondary | ICD-10-CM

## 2021-06-27 DIAGNOSIS — L570 Actinic keratosis: Secondary | ICD-10-CM | POA: Diagnosis not present

## 2021-06-27 DIAGNOSIS — L720 Epidermal cyst: Secondary | ICD-10-CM | POA: Diagnosis not present

## 2021-06-27 DIAGNOSIS — L821 Other seborrheic keratosis: Secondary | ICD-10-CM | POA: Diagnosis not present

## 2021-07-18 ENCOUNTER — Ambulatory Visit
Admission: RE | Admit: 2021-07-18 | Discharge: 2021-07-18 | Disposition: A | Discharge status: Home / Self Care | Payer: Medicare PPO | Source: Ambulatory Visit | Attending: Family Medicine | Admitting: Family Medicine

## 2021-07-18 ENCOUNTER — Other Ambulatory Visit: Payer: Self-pay | Admitting: Family Medicine

## 2021-07-18 ENCOUNTER — Other Ambulatory Visit: Payer: Self-pay

## 2021-07-18 DIAGNOSIS — N63 Unspecified lump in unspecified breast: Secondary | ICD-10-CM

## 2021-07-18 DIAGNOSIS — Z1231 Encounter for screening mammogram for malignant neoplasm of breast: Secondary | ICD-10-CM

## 2021-07-24 ENCOUNTER — Ambulatory Visit
Admission: RE | Admit: 2021-07-24 | Discharge: 2021-07-24 | Disposition: A | Discharge status: Home / Self Care | Payer: Medicare PPO | Source: Ambulatory Visit | Attending: Family Medicine | Admitting: Family Medicine

## 2021-07-24 ENCOUNTER — Other Ambulatory Visit: Payer: Self-pay

## 2021-07-24 DIAGNOSIS — N63 Unspecified lump in unspecified breast: Secondary | ICD-10-CM

## 2021-07-24 DIAGNOSIS — R922 Inconclusive mammogram: Secondary | ICD-10-CM | POA: Diagnosis not present

## 2021-07-31 DIAGNOSIS — E1165 Type 2 diabetes mellitus with hyperglycemia: Secondary | ICD-10-CM | POA: Diagnosis not present

## 2021-08-07 DIAGNOSIS — N200 Calculus of kidney: Secondary | ICD-10-CM | POA: Diagnosis not present

## 2021-08-07 DIAGNOSIS — Z634 Disappearance and death of family member: Secondary | ICD-10-CM | POA: Diagnosis not present

## 2021-08-07 DIAGNOSIS — Z6832 Body mass index (BMI) 32.0-32.9, adult: Secondary | ICD-10-CM | POA: Diagnosis not present

## 2021-08-07 DIAGNOSIS — R609 Edema, unspecified: Secondary | ICD-10-CM | POA: Diagnosis not present

## 2021-08-07 DIAGNOSIS — E1169 Type 2 diabetes mellitus with other specified complication: Secondary | ICD-10-CM | POA: Diagnosis not present

## 2021-08-07 DIAGNOSIS — E785 Hyperlipidemia, unspecified: Secondary | ICD-10-CM | POA: Diagnosis not present

## 2021-08-07 DIAGNOSIS — E041 Nontoxic single thyroid nodule: Secondary | ICD-10-CM | POA: Diagnosis not present

## 2021-08-19 DIAGNOSIS — H40013 Open angle with borderline findings, low risk, bilateral: Secondary | ICD-10-CM | POA: Diagnosis not present

## 2021-09-17 DIAGNOSIS — E669 Obesity, unspecified: Secondary | ICD-10-CM | POA: Diagnosis not present

## 2021-09-17 DIAGNOSIS — M1990 Unspecified osteoarthritis, unspecified site: Secondary | ICD-10-CM | POA: Diagnosis not present

## 2021-09-17 DIAGNOSIS — F324 Major depressive disorder, single episode, in partial remission: Secondary | ICD-10-CM | POA: Diagnosis not present

## 2021-09-17 DIAGNOSIS — K581 Irritable bowel syndrome with constipation: Secondary | ICD-10-CM | POA: Diagnosis not present

## 2021-09-17 DIAGNOSIS — F419 Anxiety disorder, unspecified: Secondary | ICD-10-CM | POA: Diagnosis not present

## 2021-09-17 DIAGNOSIS — I10 Essential (primary) hypertension: Secondary | ICD-10-CM | POA: Diagnosis not present

## 2021-09-17 DIAGNOSIS — R32 Unspecified urinary incontinence: Secondary | ICD-10-CM | POA: Diagnosis not present

## 2021-09-17 DIAGNOSIS — E11620 Type 2 diabetes mellitus with diabetic dermatitis: Secondary | ICD-10-CM | POA: Diagnosis not present

## 2021-09-17 DIAGNOSIS — J45909 Unspecified asthma, uncomplicated: Secondary | ICD-10-CM | POA: Diagnosis not present

## 2021-10-01 DIAGNOSIS — N200 Calculus of kidney: Secondary | ICD-10-CM | POA: Diagnosis not present

## 2021-10-27 IMAGING — MG MM DIGITAL DIAGNOSTIC UNILAT*R* W/ TOMO W/ CAD
6 series · 6 of 18 positions shown · non-contrast
Comparison: Previous exam(s).

CLINICAL DATA: 74-year-old presenting with a possible palpable lump
located at the OUTER edge of the RIGHT areola which she initially
noted approximately 3 weeks ago.

EXAM:
DIGITAL DIAGNOSTIC RIGHT MAMMOGRAM WITH CAD AND TOMO
ULTRASOUND RIGHT BREAST

[R TAN synth-2D]
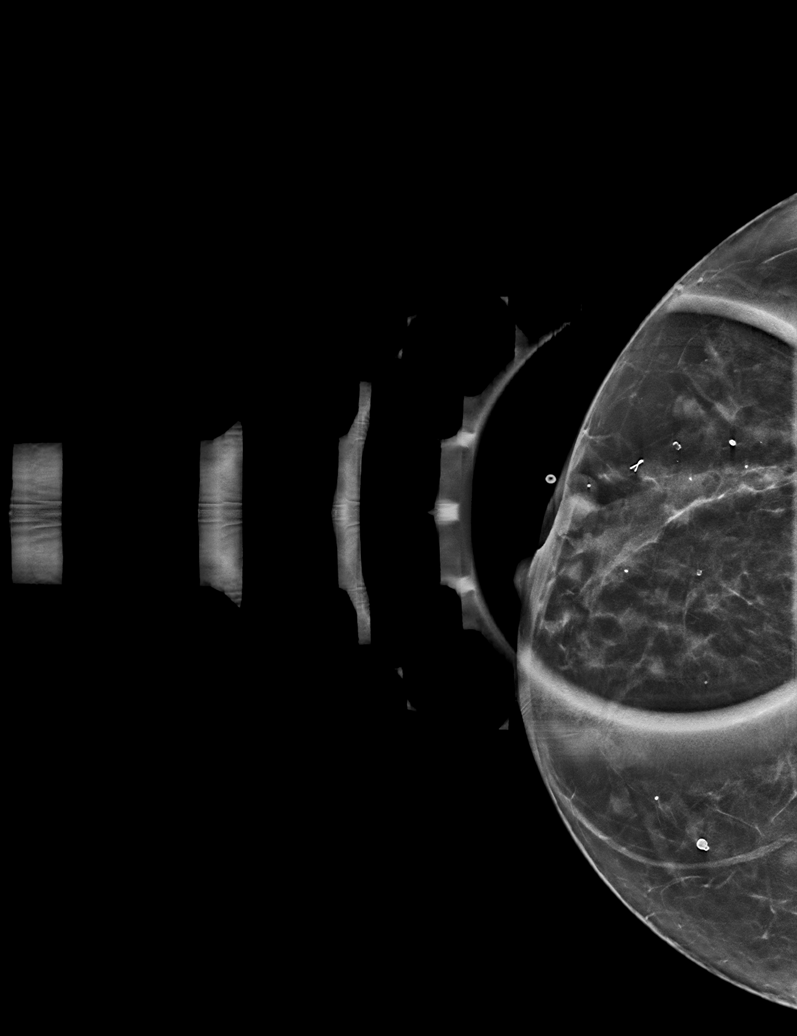

[R CC synth-2D]
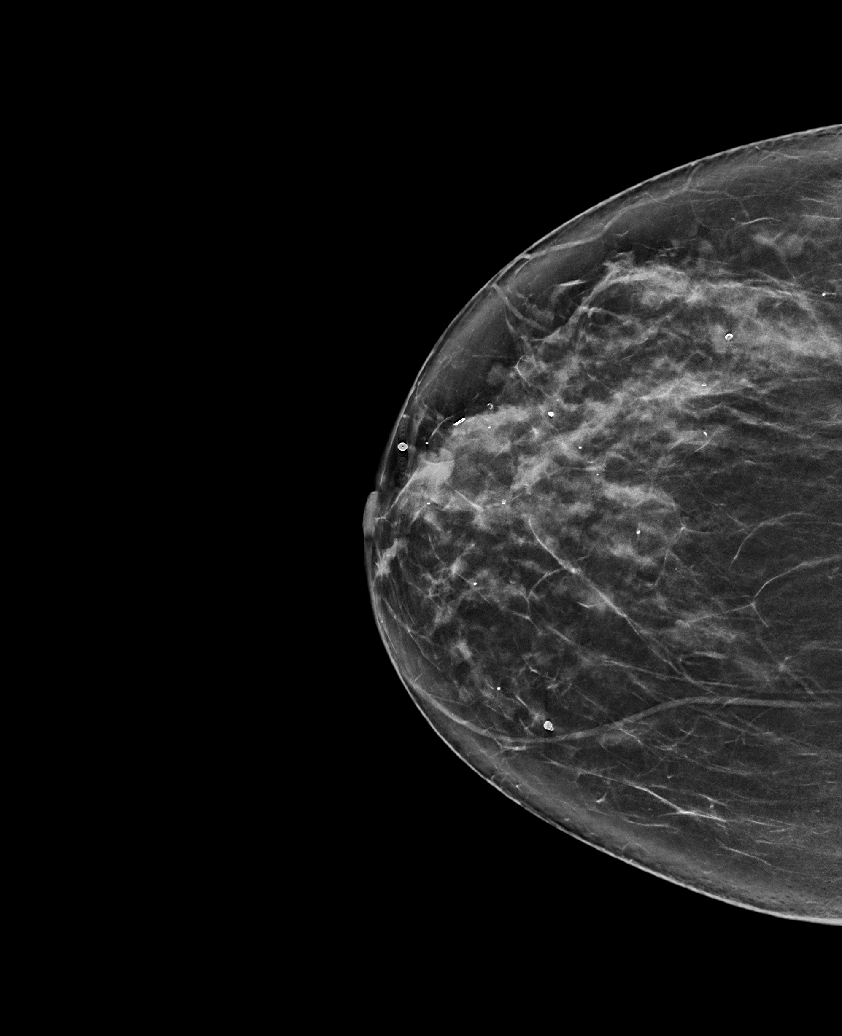

[R MLO synth-2D]
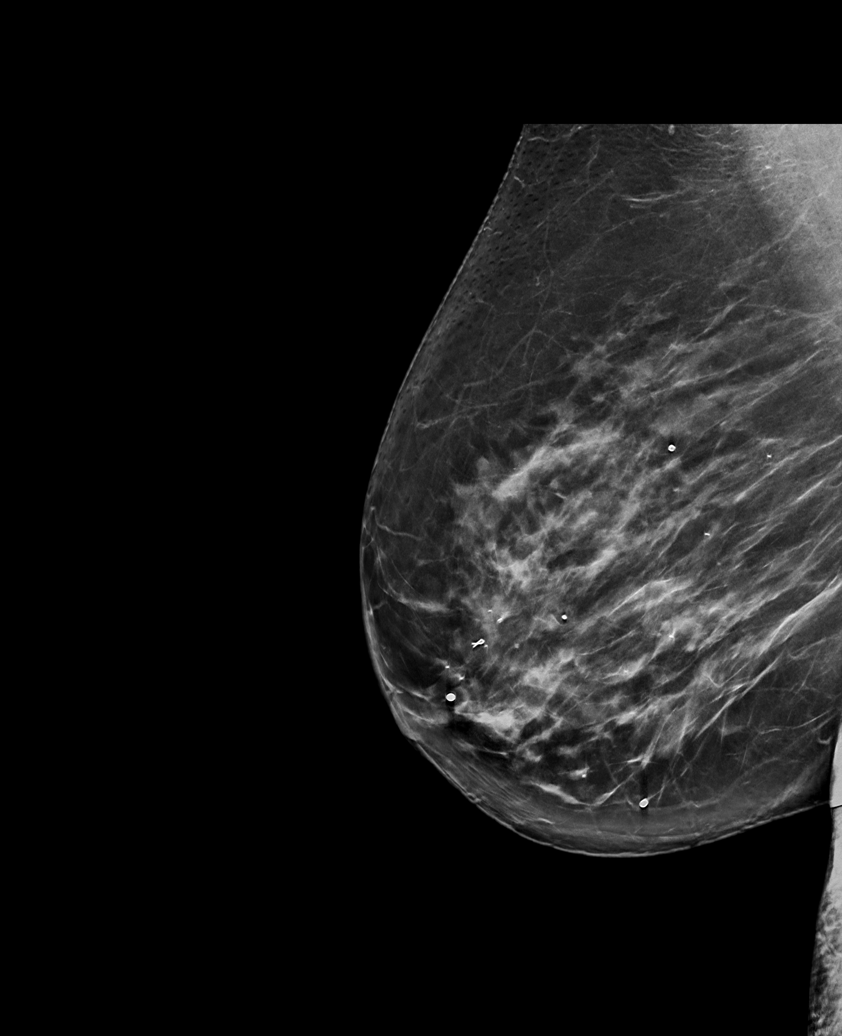

[R TAN tomo · tomo slice 25/50.0]
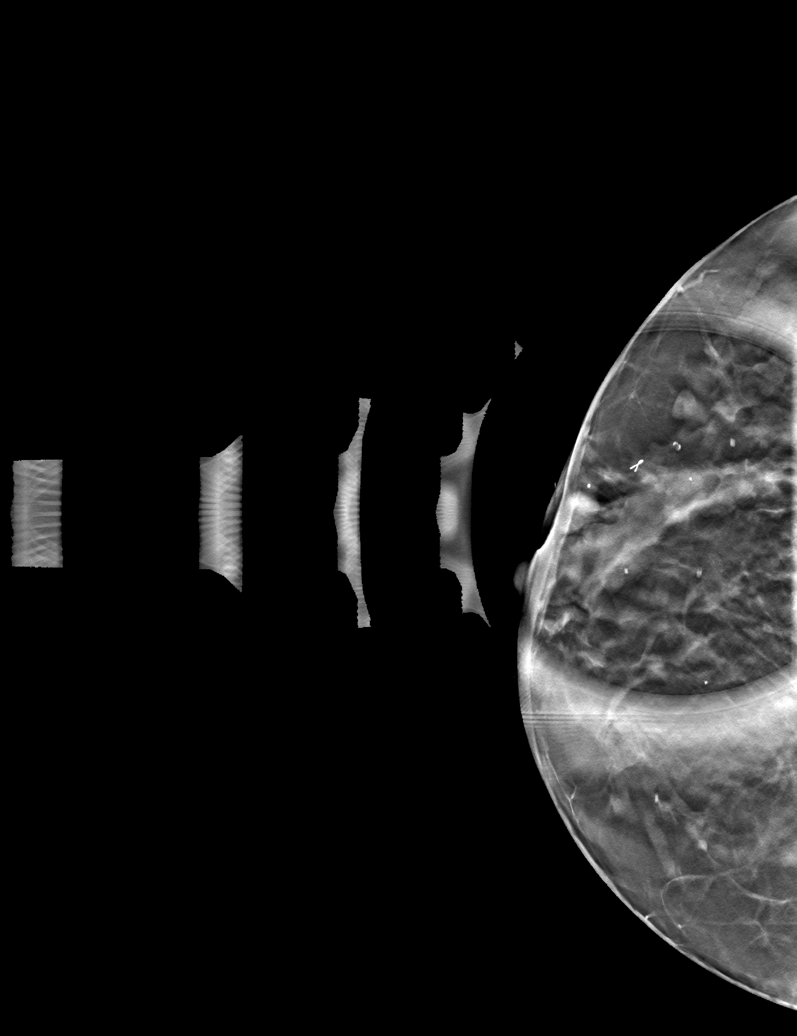

[R CC tomo · tomo slice 37/73.0]
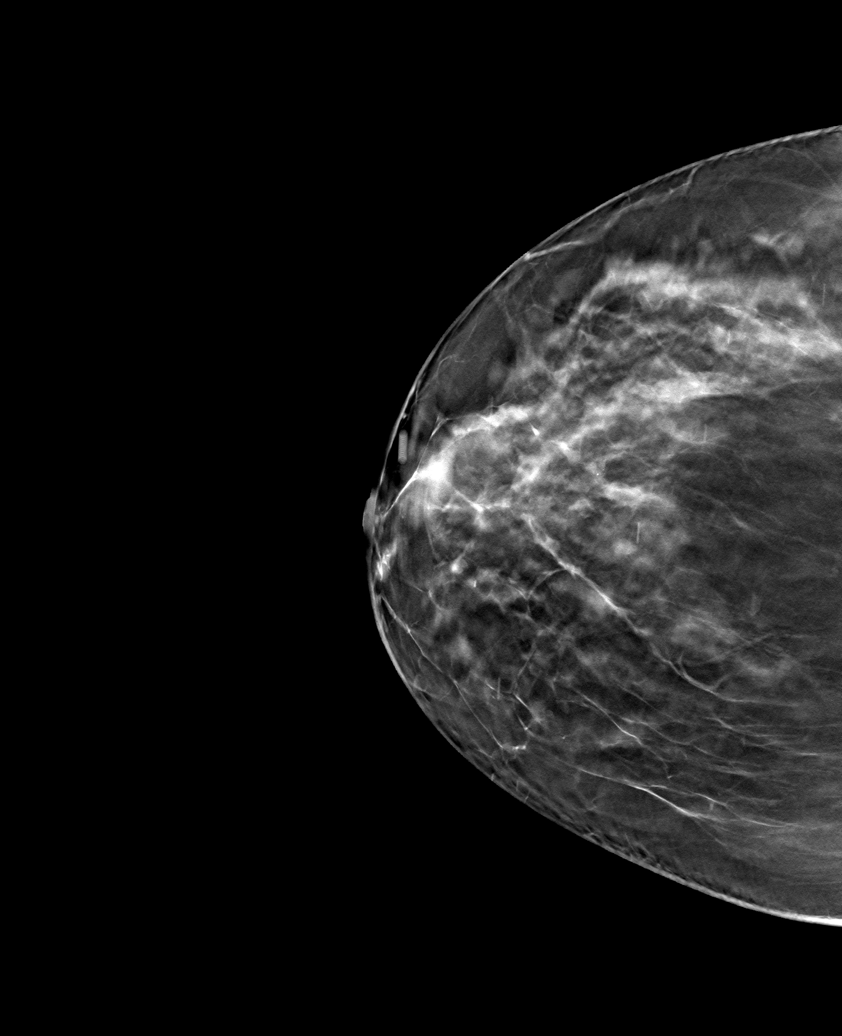

[R MLO tomo · tomo slice 43/84.0]
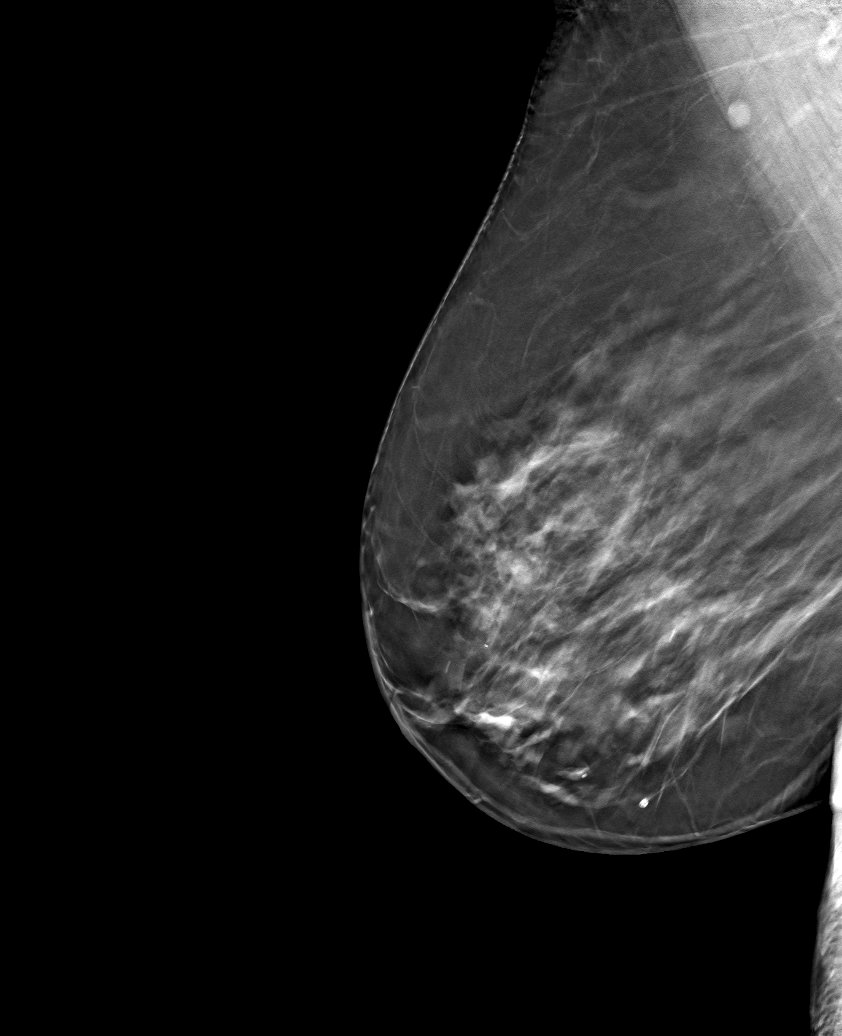

[6 of 18 positions shown; findings below may reference images not displayed]

ACR Breast Density Category c: The breast tissue is heterogeneously
dense, which may obscure small masses.
FINDINGS: Tomosynthesis and synthesized digital 2D full field CC and MLO views
of the RIGHT breast were obtained. Tomosynthesis and synthesized
digital 2D spot compression tangential view of the area of concern
in the RIGHT breast was also obtained. Mammographic images were
processed with CAD.

On correlative physical exam, there is a soft palpable superficial
lump in the OUTER portion of the RIGHT areola at the 9 o'clock
location corresponding to what the patient is feeling.

Corresponding to the palpable concern in the OUTER areola is a
superficial mass measuring approximately 1 cm without associated
architectural distortion or suspicious calcifications. The mass has
been visible on prior mammograms without significant change. The
mass is in an area of duct ectasia that has been stable over
multiple prior mammograms.

No new or suspicious findings in the RIGHT breast.

Targeted ultrasound is performed in the area of palpable concern,
showing duct ectasia in the OUTER subareolar RIGHT breast at the 9
o'clock location approximately 1 cm from the nipple. Corresponding
to the palpable concern is a focally dilated duct superficially
measuring 1.2 cm. There are no intraductal masses. No suspicious
solid mass or abnormal acoustic shadowing is identified.
IMPRESSION: No mammographic or sonographic evidence of malignancy involving the
RIGHT breast.

RECOMMENDATION:
Annual BILATERAL screening mammography which is due in late [REDACTED] or
June 2021.

I have discussed the findings and recommendations with the patient.
If applicable, a reminder letter will be sent to the patient
regarding the next appointment.

BI-RADS CATEGORY  2: Benign.

## 2021-10-27 IMAGING — US A
1 series · 8 of 8 positions shown · non-contrast
Comparison: Previous exam(s).

CLINICAL DATA: 74-year-old presenting with a possible palpable lump
located at the OUTER edge of the RIGHT areola which she initially
noted approximately 3 weeks ago.

EXAM:
DIGITAL DIAGNOSTIC RIGHT MAMMOGRAM WITH CAD AND TOMO
ULTRASOUND RIGHT BREAST

[Series 1: a · 0.06mm/px · 8 of 8 slices shown]
[im 1/8]
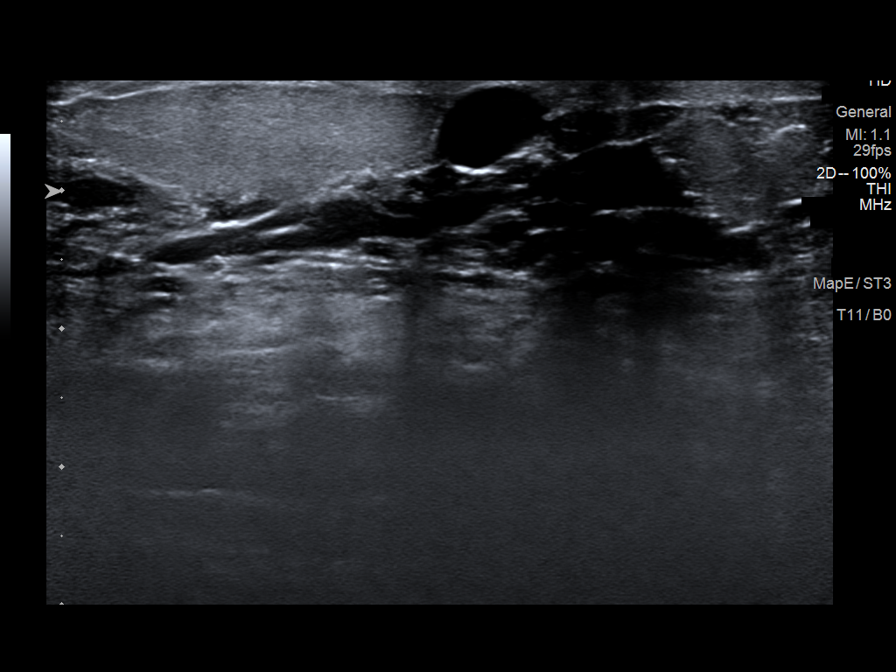
[im 2/8]
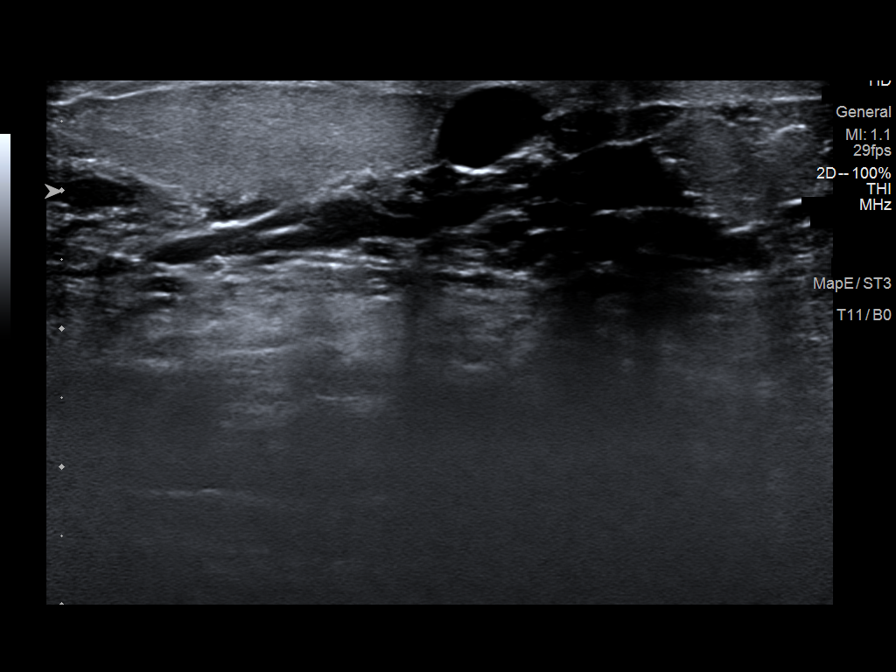
[im 3/8]
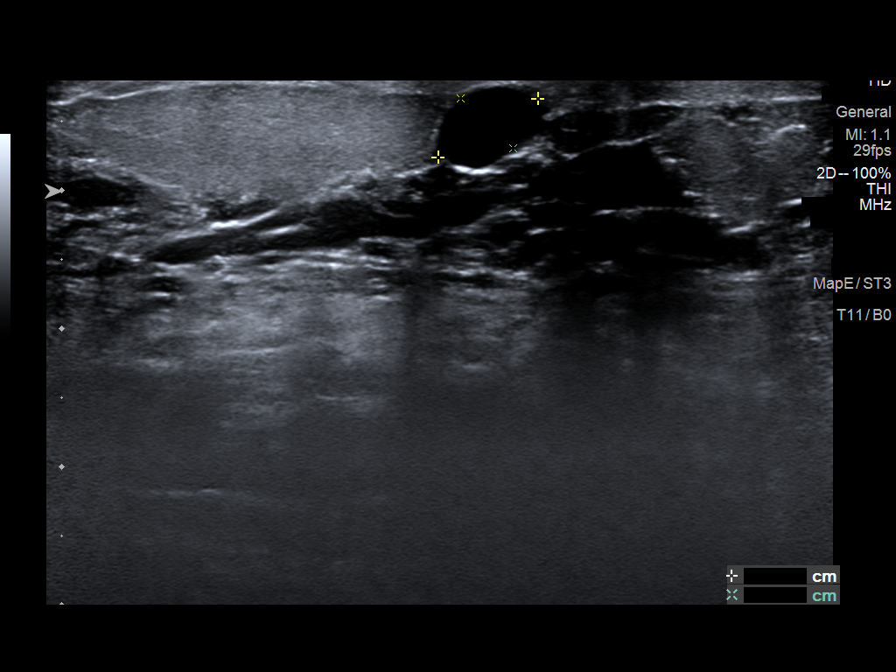
[im 4/8]
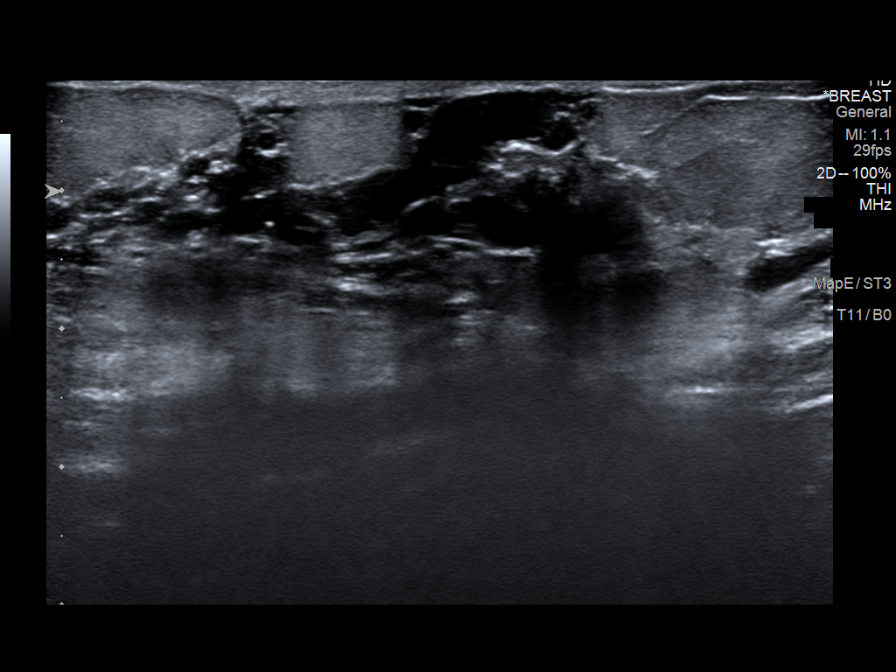
[im 5/8]
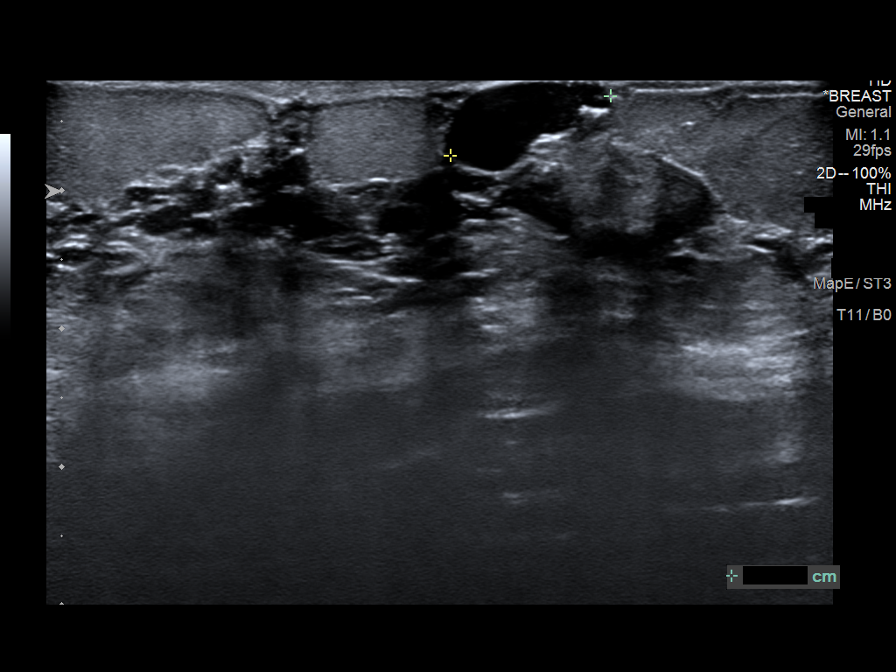
[im 6/8]
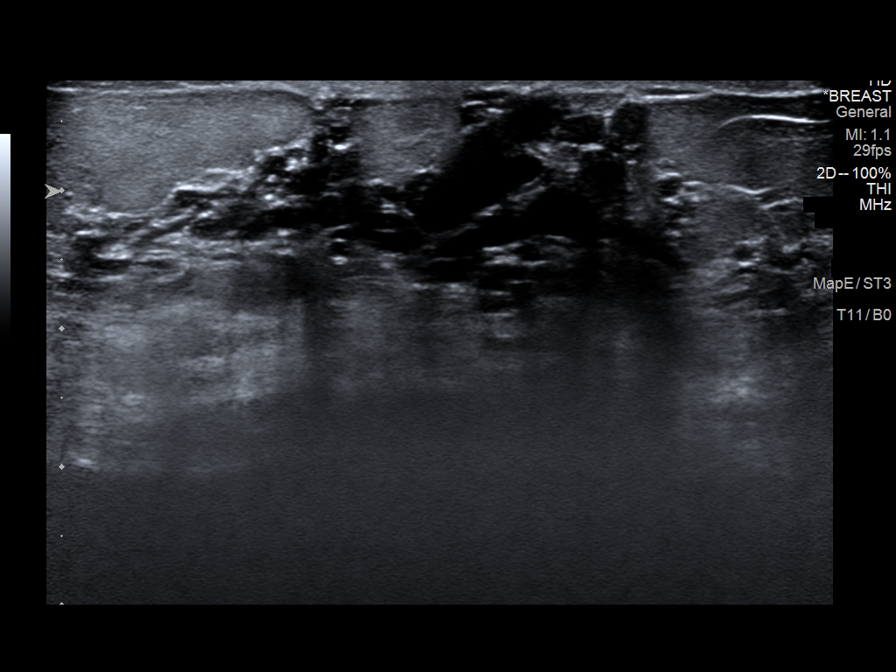
[im 7/8]
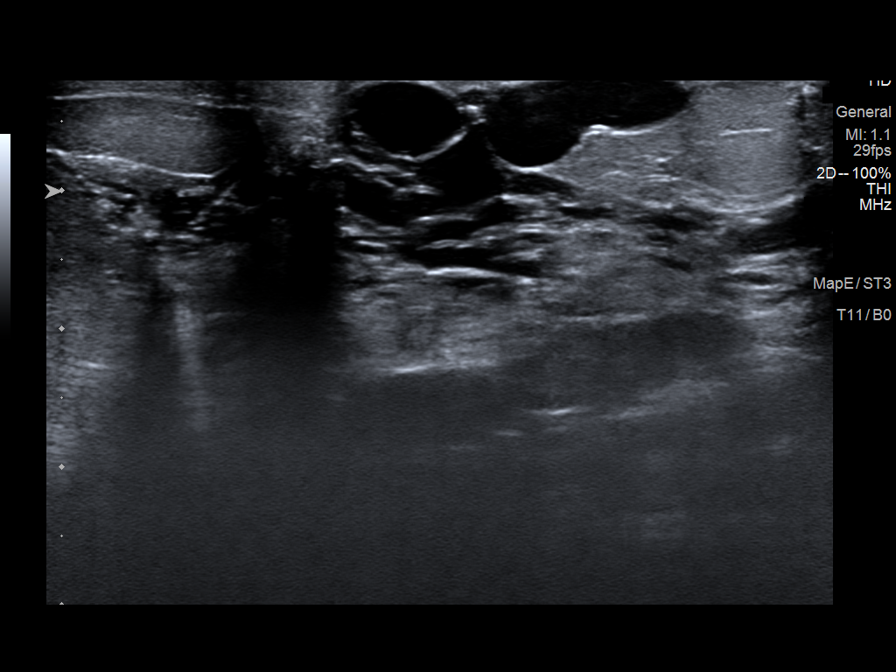
[im 8/8]
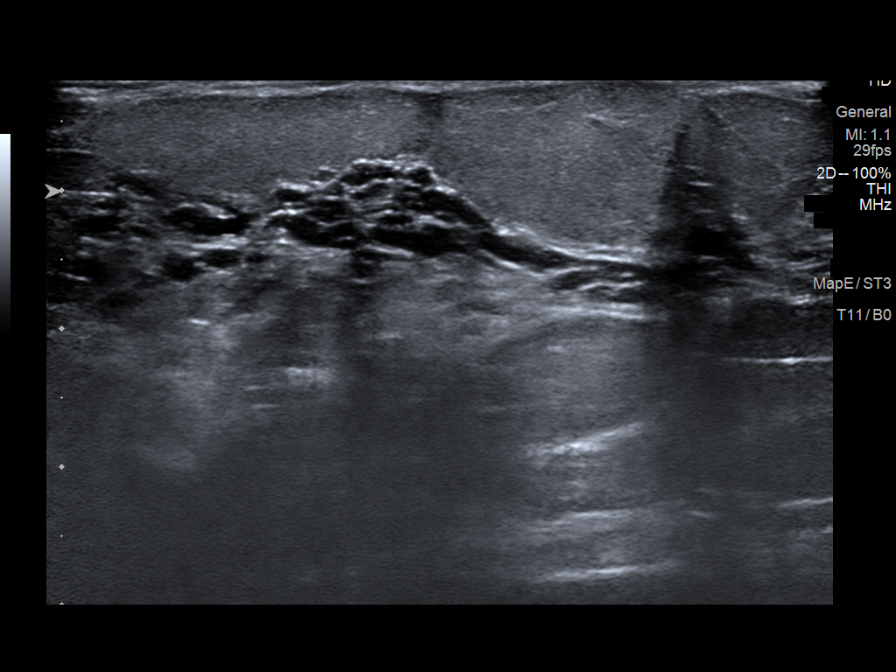

[8 of 8 positions shown; findings below may reference images not displayed]

ACR Breast Density Category c: The breast tissue is heterogeneously
dense, which may obscure small masses.
FINDINGS: Tomosynthesis and synthesized digital 2D full field CC and MLO views
of the RIGHT breast were obtained. Tomosynthesis and synthesized
digital 2D spot compression tangential view of the area of concern
in the RIGHT breast was also obtained. Mammographic images were
processed with CAD.

On correlative physical exam, there is a soft palpable superficial
lump in the OUTER portion of the RIGHT areola at the 9 o'clock
location corresponding to what the patient is feeling.

Corresponding to the palpable concern in the OUTER areola is a
superficial mass measuring approximately 1 cm without associated
architectural distortion or suspicious calcifications. The mass has
been visible on prior mammograms without significant change. The
mass is in an area of duct ectasia that has been stable over
multiple prior mammograms.

No new or suspicious findings in the RIGHT breast.

Targeted ultrasound is performed in the area of palpable concern,
showing duct ectasia in the OUTER subareolar RIGHT breast at the 9
o'clock location approximately 1 cm from the nipple. Corresponding
to the palpable concern is a focally dilated duct superficially
measuring 1.2 cm. There are no intraductal masses. No suspicious
solid mass or abnormal acoustic shadowing is identified.
IMPRESSION: No mammographic or sonographic evidence of malignancy involving the
RIGHT breast.

RECOMMENDATION:
Annual BILATERAL screening mammography which is due in late [REDACTED] or
June 2021.

I have discussed the findings and recommendations with the patient.
If applicable, a reminder letter will be sent to the patient
regarding the next appointment.

BI-RADS CATEGORY  2: Benign.

## 2021-12-04 DIAGNOSIS — Z7985 Long-term (current) use of injectable non-insulin antidiabetic drugs: Secondary | ICD-10-CM | POA: Diagnosis not present

## 2021-12-04 DIAGNOSIS — Z961 Presence of intraocular lens: Secondary | ICD-10-CM | POA: Diagnosis not present

## 2021-12-04 DIAGNOSIS — E119 Type 2 diabetes mellitus without complications: Secondary | ICD-10-CM | POA: Diagnosis not present

## 2021-12-04 DIAGNOSIS — H53452 Other localized visual field defect, left eye: Secondary | ICD-10-CM | POA: Diagnosis not present

## 2021-12-04 DIAGNOSIS — H35362 Drusen (degenerative) of macula, left eye: Secondary | ICD-10-CM | POA: Diagnosis not present

## 2021-12-18 NOTE — Progress Notes (Signed)
75 y.o. G57P0011 Widowed Caucasian female here for breast and pelvic exam.    Patient notices a change in her right self breast exam.  She has some right breast pain and feels like there is a mass behind the mass.  She had a benign biopsy in 2009.   Will have multiple appointments with her providers:  PCP, endocrinology, dermatology, and urology.  Took Valtrex for shingles last year.   3 grand children marrying in the next year.   PCP:  Sharilyn Sites, MD  Patient's last menstrual period was 12/29/1976 (approximate).           Sexually active: No.  The current method of family planning is status post hysterectomy.    Exercising: No.   Recumbent bike, walking and leg lifts Smoker:  no  Health Maintenance: Pap:  2010 Neg History of abnormal Pap:  no MMG:  07-24-21 Diag.Bil/Rt.US/Rt.benign duct ectasia/Neg/BiRads2/screening 4yrColonoscopy:  2014, due in 2024. BMD:  04-16-20  Result :Osteopenia--took Fosamax in past & had reflux.  FRAX - low fracture risk.  PCP following.  TDaP:  PCP Gardasil:   no HIV:no Hep C:no Screening Labs:  PCP   reports that she has never smoked. She has never used smokeless tobacco. She reports current alcohol use. She reports that she does not use drugs.  Past Medical History:  Diagnosis Date   Anxiety    takes xanax   Arthritis    fingers and neck   Cataract immature    Diabetes mellitus without complication (HCC)    Elevated hemoglobin A1c 02/2015   level 6.2   Family history of adverse reaction to anesthesia    uncle had a stroke following anesthesia   Fatty liver    Fibromyalgia    GERD (gastroesophageal reflux disease)    History of bronchitis    History of hiatal hernia    History of kidney stones    Hypertension    IBS (irritable bowel syndrome)    Imbalance    Macular degeneration    [place]   Migraine    Neuropathy    Osteopenia 2016   PONV (postoperative nausea and vomiting)    Benadryl help with N/V since  she can no longer have SCOP; difficulty to awaken   Shingles 2021    Past Surgical History:  Procedure Laterality Date   ABDOMINAL HYSTERECTOMY  1978   partial   BILATERAL SALPINGOOPHORECTOMY  2010   with robot   BREAST BIOPSY Right 03/06/2008   benign   CATARACT EXTRACTION, BILATERAL  2018   CHOLECYSTECTOMY  2005   lap   COLONOSCOPY     CYSTOSCOPY N/A 03/13/2015   Procedure: CYSTOSCOPY;  Surgeon: BNunzio Cobbs MD;  [facility];  Service: Gynecology;  Laterality: N/A;   CYSTOSCOPY WITH RETROGRADE PYELOGRAM, URETEROSCOPY AND STENT PLACEMENT Left 12/17/2020   Procedure: CYSTOSCOPY WITH LEFT RETROGRADE PYELOGRAM, URETEROSCOPY HOLMIUM LASER AND STENT PLACEMENT;  Surgeon: BRaynelle Bring MD;  [facility];  Service: Urology;  Laterality: Left;  REQUESTING 2 HRS   CYSTOSCOPY/URETEROSCOPY/HOLMIUM LASER/STENT PLACEMENT Left 10/29/2020   Procedure: CYSTOSCOPY/RETROGRADE/URETEROSCOPY/HOLMIUM LASER/STENT PLACEMENT;  Surgeon: BRaynelle Bring MD;  [facility];  Service: Urology;  Laterality: Left;   CYSTOSCOPY/URETEROSCOPY/HOLMIUM LASER/STENT PLACEMENT Left 01/21/2021   Procedure: CYSTOSCOPY/RETROGRADE/URETEROSCOPY/HOLMIUM LASER/STENT PLACEMENT;  Surgeon: BRaynelle Bring MD;  [facility];  Service: Urology;  Laterality: Left;   IR NEPHRO TUBE REMOV/FL  03/20/2020   IR NEPHROSTOGRAM RIGHT THRU EXISTING ACCESS  03/20/2020   IR URETERAL STENT RIGHT NEW ACCESS W/O SEP NEPHROSTOMY CATH  03/15/2020   LITHOTRIPSY     20 years ago   NEPHROLITHOTOMY Right 03/15/2020   Procedure: NEPHROLITHOTOMY PERCUTANEOUS/ INTERVENTIONAL RADIOLOCGY TO PLACE POSTERIOR CALYX PERCUTANEOUS ACCESS PRIOR;  Surgeon: Raynelle Bring, MD;  Location: [facility];  Service: Urology;  Laterality: Right;  NEEDS 150 MIN FOR PROCEDURE   ROBOTIC ASSISTED SALPINGO OOPHERECTOMY Left 03/13/2015   Procedure: ROBOTIC ASSISTED INCISION OF  LEFT PELVIC MASS, LYSIS OF ADHESIONS  WITH PELVIC WASHINGS;  Surgeon: Nunzio Cobbs, MD;  Location: [facility];  Service: Gynecology;  Laterality: Left;   SHOULDER SURGERY Left    bone spurs   TONSILLECTOMY     as child   TUBAL LIGATION  1976   UPPER GI ENDOSCOPY      Current Outpatient Medications  Medication Sig Dispense Refill   ACCU-CHEK GUIDE test strip      Accu-Chek Softclix Lancets lancets      ALPRAZolam (XANAX) 1 MG tablet Take 0.5-1.5 mg by mouth See admin instructions. Take 1.5 mg at night, may take a 0.5 mg dose as needed for anxiety     Blood Glucose Monitoring Suppl (ACCU-CHEK GUIDE) w/Device KIT      Cholecalciferol (VITAMIN D3) 5000 UNITS CAPS Take 5,000 Units by mouth daily.      clotrimazole-betamethasone (LOTRISONE) cream 1 application     diphenhydrAMINE (BENADRYL) 25 MG tablet Take 25 mg by mouth at bedtime.     docusate sodium (COLACE) 100 MG capsule Take 100 mg by mouth daily as needed for mild constipation.     DULoxetine (CYMBALTA) 60 MG capsule Take 60 mg by mouth at bedtime.     famotidine (PEPCID) 20 MG tablet Take 20 mg by mouth daily as needed for heartburn or indigestion.      hydrochlorothiazide (HYDRODIURIL) 25 MG tablet 1 tablet in the morning     LINZESS 290 MCG CAPS capsule Take 290 mcg by mouth daily as needed (constipation).     Loratadine POWD See admin instructions.     montelukast (SINGULAIR) 10 MG tablet 1 tablet     Multiple Vitamins-Minerals (PRESERVISION AREDS) CAPS Take 1 capsule by mouth daily.     mupirocin ointment (BACTROBAN) 2 % 1 application     Polyethyl Glycol-Propyl Glycol (SYSTANE ULTRA PF OP) Place 1 drop into both eyes in the morning and at bedtime.      Semaglutide,0.25 or 0.5MG/DOS, (OZEMPIC, 0.25 OR 0.5 MG/DOSE,) 2 MG/1.5ML SOPN 0.5 mg     simethicone (MYLICON) 80 MG chewable tablet Chew 80 mg by mouth daily as needed (gas).      traMADol (ULTRAM) 50 MG tablet 1 tablet as needed     valACYclovir (VALTREX) 1000 MG tablet Take by mouth.     No current facility-administered medications for this visit.     Family History  Adopted: Yes  Problem Relation Age of Onset   Hypertension Mother    Thyroid disease Mother    Diabetes Maternal Grandmother    Hypertension Maternal Grandmother    Heart attack Maternal Grandmother    Hypertension Maternal Grandfather    Stroke Maternal Grandfather    Stroke Maternal Uncle    Stroke Maternal Uncle    Heart attack Maternal Uncle    Heart attack Maternal Uncle    Lung cancer Maternal Uncle     Review of Systems  All other systems reviewed and are negative.  Exam:   BP 122/74  Pulse 74    Ht _0  (1.549 m)    Wt 169 lb (76.7 kg)    LMP 12/29/1976 (Approximate)    SpO2 99%    BMI 31.93 kg/m     General appearance: alert, cooperative and appears stated age Head: normocephalic, without obvious abnormality, atraumatic Neck: no adenopathy, supple, symmetrical, trachea midline and thyroid normal to inspection and palpation Lungs: clear to auscultation bilaterally Breasts: right - normal appearance, bilobed mass at 9:00, each component 5 mm at the edge of the areola, no tenderness, No nipple retraction or dimpling, No nipple discharge or bleeding, No axillary adenopathy Left breast - normal appearance, no mass, no tenderness, No nipple retraction or dimpling, No nipple discharge or bleeding, No axillary adenopathy Heart: regular rate and rhythm Abdomen: soft, mild tenderness in RLQ; no masses, no organomegaly Extremities: extremities normal, atraumatic, no cyanosis or edema Skin: skin color, texture, turgor normal. No rashes or lesions Lymph nodes: cervical, supraclavicular, and axillary nodes normal. Neurologic: grossly normal  Pelvic: External genitalia:  no lesions              No abnormal inguinal nodes palpated.              Urethra:  normal appearing urethra with no masses, tenderness or lesions              Bartholins and Skenes: normal                 Vagina: normal appearing vagina with normal color and discharge, no lesions               Cervix:  absent              Pap taken: no Bimanual Exam:  Uterus: absent              Adnexa: no mass, fullness, tenderness              Rectal exam: Yes.  .  Confirms.              Anus:  normal sphincter tone, no lesions  Chaperone was present for exam:  Estill Bamberg, CMA  Assessment:   Well woman visit with gynecologic exam. Status post TAH.  Status post laparoscopic BSO with robotic approach.  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 Osteopenia.  Followed by PCP. Depression and anxiety.  On Cymbalta. DM. Right breast mass, change.    Renal stones.  Plan: Right dx mammogram and right breast [facility].  May benefit from breast surgeon consultation.  Self breast awareness reviewed Pap not indicated. Guidelines for Calcium, Vitamin D, regular exercise program including cardiovascular and weight bearing exercise. Follow up in 2 years for routine exam and prn  After visit summary provided.   20 min  total time was spent for this patient encounter, including preparation, face-to-face counseling with the patient, coordination of care, and documentation of the encounter.

## 2021-12-19 ENCOUNTER — Ambulatory Visit: Payer: Medicare PPO | Admitting: Obstetrics and Gynecology

## 2021-12-31 ENCOUNTER — Ambulatory Visit (INDEPENDENT_AMBULATORY_CARE_PROVIDER_SITE_OTHER): Payer: Medicare PPO | Admitting: Obstetrics and Gynecology

## 2021-12-31 ENCOUNTER — Telehealth: Payer: Self-pay | Admitting: Obstetrics and Gynecology

## 2021-12-31 ENCOUNTER — Other Ambulatory Visit: Payer: Self-pay

## 2021-12-31 ENCOUNTER — Encounter: Payer: Self-pay | Admitting: Obstetrics and Gynecology

## 2021-12-31 VITALS — BP 122/74 | HR 74 | Ht 61.0 in | Wt 169.0 lb

## 2021-12-31 DIAGNOSIS — Z01419 Encounter for gynecological examination (general) (routine) without abnormal findings: Secondary | ICD-10-CM

## 2021-12-31 DIAGNOSIS — N6310 Unspecified lump in the right breast, unspecified quadrant: Secondary | ICD-10-CM

## 2021-12-31 DIAGNOSIS — N6315 Unspecified lump in the right breast, overlapping quadrants: Secondary | ICD-10-CM | POA: Diagnosis not present

## 2021-12-31 NOTE — Patient Instructions (Signed)

## 2021-12-31 NOTE — Telephone Encounter (Signed)
Please schedule a dx right mammogram and [facility].  Patient has 2 adjacent lumps at the 9:00 position at the edge of the areola.   This has been previously imaged, but appears to have changed.

## 2021-12-31 NOTE — Telephone Encounter (Signed)
Orders placed and made next available appt w/ [facility] on 02/05/22 @ 840--pt notified and voiced understanding. Made aware can always call and check for cancellations.

## 2022-01-06 DIAGNOSIS — D1801 Hemangioma of skin and subcutaneous tissue: Secondary | ICD-10-CM | POA: Diagnosis not present

## 2022-01-06 DIAGNOSIS — D2261 Melanocytic nevi of right upper limb, including shoulder: Secondary | ICD-10-CM | POA: Diagnosis not present

## 2022-01-06 DIAGNOSIS — D225 Melanocytic nevi of trunk: Secondary | ICD-10-CM | POA: Diagnosis not present

## 2022-01-06 DIAGNOSIS — L821 Other seborrheic keratosis: Secondary | ICD-10-CM | POA: Diagnosis not present

## 2022-01-06 DIAGNOSIS — L720 Epidermal cyst: Secondary | ICD-10-CM | POA: Diagnosis not present

## 2022-01-06 DIAGNOSIS — L918 Other hypertrophic disorders of the skin: Secondary | ICD-10-CM | POA: Diagnosis not present

## 2022-01-21 DIAGNOSIS — H35362 Drusen (degenerative) of macula, left eye: Secondary | ICD-10-CM | POA: Diagnosis not present

## 2022-01-21 DIAGNOSIS — H53452 Other localized visual field defect, left eye: Secondary | ICD-10-CM | POA: Diagnosis not present

## 2022-02-05 ENCOUNTER — Ambulatory Visit
Admission: RE | Admit: 2022-02-05 | Discharge: 2022-02-05 | Disposition: A | Discharge status: Home / Self Care | Payer: Medicare PPO | Source: Ambulatory Visit | Attending: Obstetrics and Gynecology | Admitting: Obstetrics and Gynecology

## 2022-02-05 ENCOUNTER — Other Ambulatory Visit: Payer: Self-pay

## 2022-02-05 DIAGNOSIS — N6310 Unspecified lump in the right breast, unspecified quadrant: Secondary | ICD-10-CM

## 2022-02-05 DIAGNOSIS — R922 Inconclusive mammogram: Secondary | ICD-10-CM | POA: Diagnosis not present

## 2022-02-10 DIAGNOSIS — Z7985 Long-term (current) use of injectable non-insulin antidiabetic drugs: Secondary | ICD-10-CM | POA: Diagnosis not present

## 2022-02-10 DIAGNOSIS — H35362 Drusen (degenerative) of macula, left eye: Secondary | ICD-10-CM | POA: Diagnosis not present

## 2022-02-10 DIAGNOSIS — R7989 Other specified abnormal findings of blood chemistry: Secondary | ICD-10-CM | POA: Diagnosis not present

## 2022-02-10 DIAGNOSIS — E119 Type 2 diabetes mellitus without complications: Secondary | ICD-10-CM | POA: Diagnosis not present

## 2022-02-10 DIAGNOSIS — Z961 Presence of intraocular lens: Secondary | ICD-10-CM | POA: Diagnosis not present

## 2022-02-10 DIAGNOSIS — H3552 Pigmentary retinal dystrophy: Secondary | ICD-10-CM | POA: Diagnosis not present

## 2022-02-11 DIAGNOSIS — Z6830 Body mass index (BMI) 30.0-30.9, adult: Secondary | ICD-10-CM | POA: Diagnosis not present

## 2022-02-11 DIAGNOSIS — F419 Anxiety disorder, unspecified: Secondary | ICD-10-CM | POA: Diagnosis not present

## 2022-02-11 DIAGNOSIS — E6609 Other obesity due to excess calories: Secondary | ICD-10-CM | POA: Diagnosis not present

## 2022-02-12 DIAGNOSIS — N200 Calculus of kidney: Secondary | ICD-10-CM | POA: Diagnosis not present

## 2022-02-20 DIAGNOSIS — H3552 Pigmentary retinal dystrophy: Secondary | ICD-10-CM | POA: Diagnosis not present

## 2022-02-24 DIAGNOSIS — E785 Hyperlipidemia, unspecified: Secondary | ICD-10-CM | POA: Diagnosis not present

## 2022-02-24 DIAGNOSIS — E1169 Type 2 diabetes mellitus with other specified complication: Secondary | ICD-10-CM | POA: Diagnosis not present

## 2022-02-25 NOTE — Progress Notes (Signed)
GYNECOLOGY  VISIT   HPI: 76 y.o.   Widowed  Caucasian  female   G2P0011 with Patient's last menstrual period was 12/29/1976 (approximate).   here for Lesion between vagina and rectum of a few weeks duration. Some tenderness, no bleeding or pain.  May be enlarging slightly.   Thought it was hemorrhoid initially.   Uncomfortable with sitting for a while.  No liquid drainage.  GYNECOLOGIC HISTORY: Patient's last menstrual period was 12/29/1976 (approximate). Contraception: hysterectomy Menopausal hormone therapy: none Last mammogram: 02/05/22- birads 2 Last pap smear: 2010-neg        OB History     Gravida  2   Para  1   Term      Preterm      AB  1   Living  1      SAB  1   IAB      Ectopic      Multiple      Live Births                 Patient Active Problem List   Diagnosis Date Noted   Renal calculus, right 03/15/2020   OTHER DISEASES OF VOCAL CORDS 01/01/2009   GERD 01/01/2009   COUGH 01/01/2009    Past Medical History:  Diagnosis Date   Anxiety    takes xanax   Arthritis    fingers and neck   Cataract immature    Diabetes mellitus without complication (HCC)    Elevated hemoglobin A1c 02/2015   level 6.2   Family history of adverse reaction to anesthesia    uncle had a stroke following anesthesia   Fatty liver    Fibromyalgia    GERD (gastroesophageal reflux disease)    History of bronchitis    History of hiatal hernia    History of kidney stones    Hypertension    IBS (irritable bowel syndrome)    Imbalance    Macular degeneration    Melanoma ([place])    left eye   Migraine    Neuropathy    Osteopenia 2016   PONV (postoperative nausea and vomiting)    Benadryl help with N/V since she can no longer have SCOP; difficulty to awaken   Shingles 2021    Past Surgical History:  Procedure Laterality Date   ABDOMINAL HYSTERECTOMY  1978   partial   BILATERAL SALPINGOOPHORECTOMY  2010   with robot   BREAST BIOPSY Right 03/06/2008    benign   CATARACT EXTRACTION, BILATERAL  2018   CHOLECYSTECTOMY  2005   lap   COLONOSCOPY     CYSTOSCOPY N/A 03/13/2015   Procedure: CYSTOSCOPY;  Surgeon: Nunzio Cobbs, MD;  Location: [facility];  Service: Gynecology;  Laterality: N/A;   CYSTOSCOPY WITH RETROGRADE PYELOGRAM, URETEROSCOPY AND STENT PLACEMENT Left 12/17/2020   Procedure: CYSTOSCOPY WITH LEFT RETROGRADE PYELOGRAM, URETEROSCOPY HOLMIUM LASER AND STENT PLACEMENT;  Surgeon: Raynelle Bring, MD;  Location[facility];  Service: Urology;  Laterality: Left;  REQUESTING 2 HRS   CYSTOSCOPY/URETEROSCOPY/HOLMIUM LASER/STENT PLACEMENT Left 10/29/2020   Procedure: CYSTOSCOPY/RETROGRADE/URETEROSCOPY/HOLMIUM LASER/STENT PLACEMENT;  Surgeon: Raynelle Bring, MD;  [facility];  Service: Urology;  Laterality: Left;   CYSTOSCOPY/URETEROSCOPY/HOLMIUM LASER/STENT PLACEMENT Left 01/21/2021   Procedure: CYSTOSCOPY/RETROGRADE/URETEROSCOPY/HOLMIUM LASER/STENT PLACEMENT;  Surgeon: Raynelle Bring, MD;  [facility];  Service: Urology;  Laterality: Left;   IR NEPHRO TUBE REMOV/FL  03/20/2020   IR NEPHROSTOGRAM RIGHT THRU EXISTING ACCESS  03/20/2020   IR URETERAL STENT RIGHT NEW ACCESS  W/O SEP NEPHROSTOMY CATH  03/15/2020   LITHOTRIPSY     20 years ago   NEPHROLITHOTOMY Right 03/15/2020   Procedure: NEPHROLITHOTOMY PERCUTANEOUS/ INTERVENTIONAL RADIOLOCGY TO PLACE POSTERIOR CALYX PERCUTANEOUS ACCESS PRIOR;  Surgeon: Raynelle Bring, MD;  [facility];  Service: Urology;  Laterality: Right;  NEEDS 150 MIN FOR PROCEDURE   ROBOTIC ASSISTED SALPINGO OOPHERECTOMY Left 03/13/2015   Procedure: ROBOTIC ASSISTED INCISION OF  LEFT PELVIC MASS, LYSIS OF ADHESIONS  WITH PELVIC WASHINGS;  Surgeon: Nunzio Cobbs, MD;  Location: [facility];  Service: Gynecology;  Laterality: Left;   SHOULDER SURGERY Left    bone spurs   TONSILLECTOMY     as child   TUBAL LIGATION  1976   UPPER GI ENDOSCOPY      Current Outpatient Medications  Medication Sig Dispense  Refill   ACCU-CHEK GUIDE test strip      Accu-Chek Softclix Lancets lancets      ALPRAZolam (XANAX) 1 MG tablet Take 0.5-1.5 mg by mouth See admin instructions. Take 1.5 mg at night, may take a 0.5 mg dose as needed for anxiety     Blood Glucose Monitoring Suppl (ACCU-CHEK GUIDE) w/Device KIT      Cholecalciferol (VITAMIN D3) 5000 UNITS CAPS Take 5,000 Units by mouth daily.      diphenhydrAMINE (BENADRYL) 25 MG tablet Take 25 mg by mouth at bedtime.     docusate sodium (COLACE) 100 MG capsule Take 100 mg by mouth daily as needed for mild constipation.     DULoxetine (CYMBALTA) 60 MG capsule Take 60 mg by mouth at bedtime.     famotidine (PEPCID) 20 MG tablet Take 20 mg by mouth daily as needed for heartburn or indigestion.      hydrochlorothiazide (HYDRODIURIL) 25 MG tablet 1 tablet in the morning     LINZESS 290 MCG CAPS capsule Take 290 mcg by mouth daily as needed (constipation).     Loratadine POWD See admin instructions.     montelukast (SINGULAIR) 10 MG tablet 1 tablet     Multiple Vitamins-Minerals (PRESERVISION AREDS) CAPS Take 1 capsule by mouth daily.     Polyethyl Glycol-Propyl Glycol (SYSTANE ULTRA PF OP) Place 1 drop into both eyes in the morning and at bedtime.      Semaglutide,0.25 or 0.5MG/DOS, (OZEMPIC, 0.25 OR 0.5 MG/DOSE,) 2 MG/1.5ML SOPN 0.5 mg     simethicone (MYLICON) 80 MG chewable tablet Chew 80 mg by mouth daily as needed (gas).      traMADol (ULTRAM) 50 MG tablet 1 tablet as needed     No current facility-administered medications for this visit.     ALLERGIES: Amitriptyline hcl, Bystolic [nebivolol hcl], Escitalopram, Latex, Metformin, Oxycodone, Prednisone, Adhesive [tape], Aspirin, Codeine, Elavil [amitriptyline hcl], Glimepiride, Glipizide, Hydrocodone, Metformin and related, Other, Tramadol, Cephalosporins, Chlorhexidine, Ciprofloxacin, and Penicillins  Family History  Adopted: Yes  Problem Relation Age of Onset   Hypertension Mother    Thyroid disease  Mother    Diabetes Maternal Grandmother    Hypertension Maternal Grandmother    Heart attack Maternal Grandmother    Hypertension Maternal Grandfather    Stroke Maternal Grandfather    Stroke Maternal Uncle    Stroke Maternal Uncle    Heart attack Maternal Uncle    Heart attack Maternal Uncle    Lung cancer Maternal Uncle     Social History   Socioeconomic History   Marital status: Widowed    Spouse name: Not on file   Number of children: Not  on file   Years of education: Not on file   Highest education level: Not on file  Occupational History   Not on file  Tobacco Use   Smoking status: Never   Smokeless tobacco: Never  Vaping Use   Vaping Use: Never used  Substance and Sexual Activity   Alcohol use: Yes    Comment: rarely   Drug use: No   Sexual activity: Not Currently    Partners: Male    Birth control/protection: Surgical    Comment: TAH 1978/BSO 2010  Other Topics Concern   Not on file  Social History Narrative   Not on file   Social Determinants of Health   Financial Resource Strain: Not on file  Food Insecurity: Not on file  Transportation Needs: Not on file  Physical Activity: Not on file  Stress: Not on file  Social Connections: Not on file  Intimate Partner Violence: Not on file    Review of Systems  Genitourinary:  Positive for vaginal pain (vaginal lesion).  All other systems reviewed and are negative.  PHYSICAL EXAMINATION:    BP 140/80    Pulse 75    Resp (!) 97    Ht _0  (1.549 m)    Wt 169 lb (76.7 kg)    LMP 12/29/1976 (Approximate)    BMI 31.93 kg/m     General appearance: alert, cooperative and appears stated age   Pelvic: External genitalia:  two 3 - 4 mm sebaceous cysts with dry sebum of right labia majora.  2 - 3 mm subcutaneous sebaceous cyst of right inferior labia majora near perineum              Urethra:  normal appearing urethra with no masses, tenderness or lesions              Bartholins and Skenes: normal                  Vagina: normal appearing vagina with normal color and discharge, no lesions              Cervix: absent                Bimanual Exam:  Uterus: absent              Adnexa: no mass, fullness, tenderness          Chaperone was present for exam:  Lovena Le, CMA  ASSESSMENT  Sebaceous cysts of the vulva. No sign of infection.   PLAN  We discussed sebaceous cysts.  No biopsy needed. I encouraged her to not try to drain the cyst.  Call if the cyst increases in size or becomes painful. Fu prn.    An After Visit Summary was printed and given to the patient.  17 min  total time was spent for this patient encounter, including preparation, face-to-face counseling with the patient, coordination of care, and documentation of the encounter.

## 2022-02-26 ENCOUNTER — Encounter: Payer: Self-pay | Admitting: Obstetrics and Gynecology

## 2022-02-26 ENCOUNTER — Other Ambulatory Visit: Payer: Self-pay

## 2022-02-26 ENCOUNTER — Ambulatory Visit: Payer: Medicare PPO | Admitting: Obstetrics and Gynecology

## 2022-02-26 VITALS — BP 140/80 | HR 75 | Resp 97 | Ht 61.0 in | Wt 169.0 lb

## 2022-02-26 DIAGNOSIS — L723 Sebaceous cyst: Secondary | ICD-10-CM

## 2022-02-26 NOTE — Patient Instructions (Addendum)
Epidermoid Cyst/Sebaceous Cyst ?An epidermoid cyst, also called an epidermal cyst, is a small lump under your skin. The cyst contains a substance called keratin. Do not try to pop or open the cyst yourself. ?What are the causes? ?A blocked hair follicle. ?A hair that curls and re-enters the skin instead of growing straight out of the skin. ?A blocked pore. ?Irritated skin. ?An injury to the skin. ?Certain conditions that are passed along from parent to child. ?Human papillomavirus (HPV). This happens rarely when cysts occur on the bottom of the feet. ?Long-term sun damage to the skin. ?What increases the risk? ?Having acne. ?Being female. ?Having an injury to the skin. ?Being past puberty. ?Having certain conditions caused by genes (genetic disorder) ?What are the signs or symptoms? ?These cysts are usually harmless, but they can get infected. Symptoms of infection may include: ?Redness. ?Inflammation. ?Tenderness. ?Warmth. ?Fever. ?A bad-smelling substance that drains from the cyst. ?Pus that drains from the cyst. ?How is this treated? ?In many cases, epidermoid cysts go away on their own without treatment. If a cyst becomes infected, treatment may include: ?Opening and draining the cyst, done by a doctor. After draining, you may need minor surgery to remove the rest of the cyst. ?Antibiotic medicine. ?Shots of medicines (steroids) that help to reduce inflammation. ?Surgery to remove the cyst. Surgery may be done if the cyst: ?Becomes large. ?Bothers you. ?Has a chance of turning into cancer. ?Do not try to open a cyst yourself. ?Follow these instructions at home: ?Medicines ?Take over-the-counter and prescription medicines as told by your doctor. ?If you were prescribed an antibiotic medicine, take it as told by your doctor. Do not stop taking it even if you start to feel better. ?General instructions ?Keep the area around your cyst clean and dry. ?Wear loose, dry clothing. ?Avoid touching your cyst. ?Check your  cyst every day for signs of infection. Check for: ?Redness, swelling, or pain. ?Fluid or blood. ?Warmth. ?Pus or a bad smell. ?Keep all follow-up visits. ?How is this prevented? ?Wear clean, dry, clothing. ?Avoid wearing tight clothing. ?Keep your skin clean and dry. Take showers or baths every day. ?Contact a doctor if: ?Your cyst has symptoms of infection. ?Your condition does not improve or gets worse. ?You have a cyst that looks different from other cysts you have had. ?You have a fever. ?Get help right away if: ?Redness spreads from the cyst into the area close by. ?Summary ?An epidermoid cyst is a small lump under your skin. ?If a cyst becomes infected, treatment may include surgery to open and drain the cyst, or to remove it. ?Take over-the-counter and prescription medicines only as told by your doctor. ?Contact a doctor if your condition is not improving or is getting worse. ?Keep all follow-up visits. ?This information is not intended to replace advice given to you by your health care provider. Make sure you discuss any questions you have with your health care provider. ?Document Revised: 03/21/2020 Document Reviewed: 03/21/2020 ?Elsevier Patient Education ? [place]. ? ?

## 2022-02-28 DIAGNOSIS — E1169 Type 2 diabetes mellitus with other specified complication: Secondary | ICD-10-CM | POA: Diagnosis not present

## 2022-02-28 DIAGNOSIS — E785 Hyperlipidemia, unspecified: Secondary | ICD-10-CM | POA: Diagnosis not present

## 2022-02-28 DIAGNOSIS — Z6832 Body mass index (BMI) 32.0-32.9, adult: Secondary | ICD-10-CM | POA: Diagnosis not present

## 2022-02-28 DIAGNOSIS — R609 Edema, unspecified: Secondary | ICD-10-CM | POA: Diagnosis not present

## 2022-02-28 DIAGNOSIS — E041 Nontoxic single thyroid nodule: Secondary | ICD-10-CM | POA: Diagnosis not present

## 2022-02-28 DIAGNOSIS — Z634 Disappearance and death of family member: Secondary | ICD-10-CM | POA: Diagnosis not present

## 2022-02-28 DIAGNOSIS — N200 Calculus of kidney: Secondary | ICD-10-CM | POA: Diagnosis not present

## 2022-02-28 DIAGNOSIS — R5383 Other fatigue: Secondary | ICD-10-CM | POA: Diagnosis not present

## 2022-03-12 DIAGNOSIS — R682 Dry mouth, unspecified: Secondary | ICD-10-CM | POA: Diagnosis not present

## 2022-03-12 DIAGNOSIS — M3500 Sicca syndrome, unspecified: Secondary | ICD-10-CM | POA: Diagnosis not present

## 2022-03-12 DIAGNOSIS — M3589 Other specified systemic involvement of connective tissue: Secondary | ICD-10-CM | POA: Diagnosis not present

## 2022-03-12 DIAGNOSIS — H538 Other visual disturbances: Secondary | ICD-10-CM | POA: Diagnosis not present

## 2022-03-12 DIAGNOSIS — H04129 Dry eye syndrome of unspecified lacrimal gland: Secondary | ICD-10-CM | POA: Diagnosis not present

## 2022-03-12 DIAGNOSIS — R768 Other specified abnormal immunological findings in serum: Secondary | ICD-10-CM | POA: Diagnosis not present

## 2022-03-20 ENCOUNTER — Other Ambulatory Visit: Payer: Self-pay | Admitting: Urology

## 2022-03-20 DIAGNOSIS — M25511 Pain in right shoulder: Secondary | ICD-10-CM | POA: Diagnosis not present

## 2022-03-20 DIAGNOSIS — I00 Rheumatic fever without heart involvement: Secondary | ICD-10-CM | POA: Diagnosis not present

## 2022-03-20 DIAGNOSIS — M7552 Bursitis of left shoulder: Secondary | ICD-10-CM | POA: Diagnosis not present

## 2022-03-20 DIAGNOSIS — M25562 Pain in left knee: Secondary | ICD-10-CM | POA: Diagnosis not present

## 2022-03-20 DIAGNOSIS — M79642 Pain in left hand: Secondary | ICD-10-CM | POA: Diagnosis not present

## 2022-03-20 DIAGNOSIS — M3500 Sicca syndrome, unspecified: Secondary | ICD-10-CM | POA: Diagnosis not present

## 2022-03-20 DIAGNOSIS — M25561 Pain in right knee: Secondary | ICD-10-CM | POA: Diagnosis not present

## 2022-03-20 DIAGNOSIS — M25512 Pain in left shoulder: Secondary | ICD-10-CM | POA: Diagnosis not present

## 2022-03-20 DIAGNOSIS — M79641 Pain in right hand: Secondary | ICD-10-CM | POA: Diagnosis not present

## 2022-03-20 DIAGNOSIS — M25551 Pain in right hip: Secondary | ICD-10-CM | POA: Diagnosis not present

## 2022-03-20 DIAGNOSIS — M1990 Unspecified osteoarthritis, unspecified site: Secondary | ICD-10-CM | POA: Diagnosis not present

## 2022-03-21 NOTE — Progress Notes (Addendum)
Anesthesia Review: ? ?PCP: DR Sharilyn Sites  ?Cardiologist : ?Chest x-ray : ?EKG :03/25/22  ?Echo : ?Stress test: ?Cardiac Cath :  ?Activity level: can do a flight of stairs without difficutly  ?Sleep Study/ CPAP : none  ?Fasting Blood Sugar :      / Checks Blood Sugar -- times a day:   ?Blood Thinner/ Instructions /Last Dose: ?ASA / Instructions/ Last Dose :   ?DM- type 2 - checks glucose 3-4 times per week - Followed by Dr Garnet Koyanagi  at [place]  ?Hgba1c-03/25/22-6.0  ?

## 2022-03-24 NOTE — Progress Notes (Signed)
DUE TO COVID-19 ONLY ONE VISITOR IS ALLOWED TO COME WITH YOU AND STAY IN THE WAITING ROOM ONLY DURING PRE OP AND PROCEDURE DAY OF SURGERY.  2 VISITOR  MAY VISIT WITH YOU AFTER SURGERY IN YOUR PRIVATE ROOM DURING VISITING HOURS ONLY! ?YOU MAY HAVE ONE PERSON SPEND THE NITE WITH YOU IN YOUR ROOM AFTER SURGERY.   ? ?G.  ? ? Your procedure is scheduled on:  ?      03/27/22  ? Report to [facility] Main  Entrance ? ? Report to admitting at     1100             AM ?DO NOT BRING INSURANCE CARD, PICTURE ID OR WALLET DAY OF SURGERY.  ?  ? ? Call this number if you have problems the morning of surgery [phone]  ? ? REMEMBER: NO  SOLID FOODS , CANDY, GUM OR MINTS AFTER MIDNITE THE NITE BEFORE SURGERY .       Marland Kitchen CLEAR LIQUIDS UNTIL    1015            DAY OF SURGERY.       ? ? ? ? ?CLEAR LIQUID DIET ? ? ?Foods Allowed      ?WATER ?BLACK COFFEE ( SUGAR OK, NO MILK, CREAM OR CREAMER) REGULAR AND DECAF  ?TEA ( SUGAR OK NO MILK, CREAM, OR CREAMER) REGULAR AND DECAF  ?PLAIN JELLO ( NO RED)  ?FRUIT ICES ( NO RED, NO FRUIT PULP)  ?POPSICLES ( NO RED)  ?JUICE- APPLE, WHITE GRAPE AND WHITE CRANBERRY  ?SPORT DRINK LIKE GATORADE ( NO RED)  ?CLEAR BROTH ( VEGETABLE , CHICKEN OR BEEF)                                                               ? ?    ? ?BRUSH YOUR TEETH MORNING OF SURGERY AND RINSE YOUR MOUTH OUT, NO CHEWING GUM CANDY OR MINTS. ?  ? ? Take these medicines the morning of surgery with A SIP OF WATER:  claritin, singulair  ? ? ?DO NOT TAKE ANY DIABETIC MEDICATIONS DAY OF YOUR SURGERY ?                  ?            You may not have any metal on your body including hair pins and  ?            piercings  Do not wear jewelry, make-up, lotions, powders or perfumes, deodorant ?            Do not wear nail polish on your fingernails.   ?           IF YOU ARE A FEMALE AND WANT TO SHAVE UNDER ARMS OR LEGS PRIOR TO SURGERY YOU MUST DO SO AT LEAST 48 HOURS PRIOR TO SURGERY.  ?            Men may shave face and neck. ? ? Do  not bring valuables to the hospital. [facility] NOT ?            RESPONSIBLE   FOR VALUABLES. ? Contacts, dentures or bridgework may not be worn into surgery. ? Leave suitcase in the car. After surgery it  may be brought to your room. ? ?  ? Patients discharged the day of surgery will not be allowed to drive home. IF YOU ARE HAVING SURGERY AND GOING HOME THE SAME DAY, YOU MUST HAVE AN ADULT TO DRIVE YOU HOME AND BE WITH YOU FOR 24 HOURS. YOU MAY GO HOME BY TAXI OR UBER OR ORTHERWISE, BUT AN ADULT MUST ACCOMPANY YOU HOME AND STAY WITH YOU FOR 24 HOURS. ?  ? ?            Please read over the following fact sheets you were given: ?_____________________________________________________________________ ? ?[facility] - Preparing for Surgery ?Before surgery, you can play an important role.  Because skin is not sterile, your skin needs to be as free of germs as possible.  You can reduce the number of germs on your skin by washing with CHG (chlorahexidine gluconate) soap before surgery.  CHG is an antiseptic cleaner which kills germs and bonds with the skin to continue killing germs even after washing. ?Please DO NOT use if you have an allergy to CHG or antibacterial soaps.  If your skin becomes reddened/irritated stop using the CHG and inform your nurse when you arrive at Short Stay. ?Do not shave (including legs and underarms) for at least 48 hours prior to the first CHG shower.  You may shave your face/neck. ?Please follow these instructions carefully: ? 1.  Shower with CHG Soap the night before surgery and the  morning of Surgery. ? 2.  If you choose to wash your hair, wash your hair first as usual with your  normal  shampoo. ? 3.  After you shampoo, rinse your hair and body thoroughly to remove the  shampoo.                           4.  Use CHG as you would any other liquid soap.  You can apply chg directly  to the skin and wash  ?                     Gently with a scrungie or clean washcloth. ? 5.  Apply the CHG  Soap to your body ONLY FROM THE NECK DOWN.   Do not use on face/ open      ?                     Wound or open sores. Avoid contact with eyes, ears mouth and genitals (private parts).  ?                     Production manager,  Genitals (private parts) with your normal soap. ?            6.  Wash thoroughly, paying special attention to the area where your surgery  will be performed. ? 7.  Thoroughly rinse your body with warm water from the neck down. ? 8.  DO NOT shower/wash with your normal soap after using and rinsing off  the CHG Soap. ?               9.  Pat yourself dry with a clean towel. ?           10.  Wear clean pajamas. ?           11.  Place clean sheets on your bed the night of your first shower and do not  sleep with pets. ?  Day of Surgery : ?Do not apply any lotions/deodorants the morning of surgery.  Please wear clean clothes to the hospital/surgery center. ? ?FAILURE TO FOLLOW THESE INSTRUCTIONS MAY RESULT IN THE CANCELLATION OF YOUR SURGERY ?PATIENT SIGNATURE_________________________________ ? ?NURSE SIGNATURE__________________________________ ? ?________________________________________________________________________  ? ? ?           ?

## 2022-03-25 ENCOUNTER — Encounter (HOSPITAL_COMMUNITY)
Admission: RE | Admit: 2022-03-25 | Discharge: 2022-03-25 | Disposition: A | Discharge status: Home / Self Care | Payer: Medicare PPO | Source: Ambulatory Visit | Attending: Urology | Admitting: Urology

## 2022-03-25 ENCOUNTER — Encounter (HOSPITAL_COMMUNITY): Payer: Self-pay

## 2022-03-25 ENCOUNTER — Other Ambulatory Visit: Payer: Self-pay

## 2022-03-25 VITALS — BP 142/81 | HR 69 | Temp 97.7°F | Resp 18 | Ht 61.0 in | Wt 168.5 lb

## 2022-03-25 DIAGNOSIS — Z01818 Encounter for other preprocedural examination: Secondary | ICD-10-CM | POA: Diagnosis not present

## 2022-03-25 DIAGNOSIS — N200 Calculus of kidney: Secondary | ICD-10-CM | POA: Diagnosis not present

## 2022-03-25 DIAGNOSIS — E119 Type 2 diabetes mellitus without complications: Secondary | ICD-10-CM | POA: Insufficient documentation

## 2022-03-25 LAB — CBC
HCT: 44.2 % (ref 36.0–46.0)
Hemoglobin: 14.6 g/dL (ref 12.0–15.0)
MCH: 30.4 pg (ref 26.0–34.0)
MCHC: 33 g/dL (ref 30.0–36.0)
MCV: 91.9 fL (ref 80.0–100.0)
Platelets: 318 10*3/uL (ref 150–400)
RBC: 4.81 MIL/uL (ref 3.87–5.11)
RDW: 12.9 % (ref 11.5–15.5)
WBC: 11.8 10*3/uL — ABNORMAL HIGH (ref 4.0–10.5)
nRBC: 0 % (ref 0.0–0.2)

## 2022-03-25 LAB — BASIC METABOLIC PANEL
Anion gap: 5 (ref 5–15)
BUN: 29 mg/dL — ABNORMAL HIGH (ref 8–23)
CO2: 29 mmol/L (ref 22–32)
Calcium: 9.6 mg/dL (ref 8.9–10.3)
Chloride: 103 mmol/L (ref 98–111)
Creatinine, Ser: 1 mg/dL (ref 0.44–1.00)
GFR, Estimated: 59 mL/min — ABNORMAL LOW (ref >60)
Glucose, Bld: 94 mg/dL (ref 70–99)
Potassium: 4.3 mmol/L (ref 3.5–5.1)
Sodium: 137 mmol/L (ref 135–145)

## 2022-03-25 LAB — GLUCOSE, CAPILLARY: Glucose-Capillary: 130 mg/dL — ABNORMAL HIGH (ref 70–99)

## 2022-03-25 LAB — HEMOGLOBIN A1C
Hgb A1c MFr Bld: 6 % — ABNORMAL HIGH (ref 4.8–5.6)
Mean Plasma Glucose: 125.5 mg/dL

## 2022-03-26 NOTE — H&P (Signed)
? ? ?Office Visit Report     02/12/2022  ? ?-------------------------------------------------------------------------------- ?  ?Victoria Andrade  ?MRN: [id number]  ?DOB: September 15, 1946, 76 year old Female  ?SSN: [id number]  ? PRIMARY CARE:  Sharilyn Sites, MD  ?REFERRING:  Raynelle Bring, Eduardo Osier  ?PROVIDER:  Raynelle Bring, M.D.  ?LOCATION:  [facility] [phone]  ?  ? ?-------------------------------------------------------------------------------- ?  ?CC/HPI: Urolithiasis  ? ?Ms. Tewell returns today for further evaluation of her known bilateral renal stones. She follows up today for repeat imaging in anticipation of treatment prior to her grandchildren's wedding in June in [place]. She did pass a stone recently that appears to be approximately 7 mm. She currently is asymptomatic.  ? ?  ?ALLERGIES: Adhesive tape ?Ciprofloxacin HCl TABS - Skin Rash ?Keflex TABS - Skin Rash ?Latex - Trouble Breathing, Swelling ?Penicillins - Skin Rash ?PredniSONE TABS ?Sulfa Drugs - Skin Rash ?Tamsulosin HCl CAPS - Other Reaction, hair loss ?  ? ?MEDICATIONS: Amerge 2.5 mg tablet Oral  ?Benadryl TABS Oral  ?Claritin TABS Oral  ?Duloxetine Hcl  ?Hydrochlorothiazide 25 mg tablet 1 Oral  ?Linzess 145 mcg capsule 1 capsule PO Daily  ?Miralax  ?Montelukast Sodium 10 mg tablet Oral  ?Ozempic  ?Stool Softener  ?Vitamin D-3 5000 UNIT TABS Oral  ?Xanax 1 mg tablet Oral  ?  ? ?GU PSH: Cysto Remove Stent FB Sim - 11/06/2020 ?ESWL - 2017, 2016, 2013, 2013 ?Hysterectomy Unilat SO - 2013 ?Percut Stone Removal >2cm, Right - 03/15/2020 ?Ureteroscopic laser litho, Left - 01/21/2021, Left - 12/17/2020, Left - 10/29/2020 ? ?  ? ?NON-GU PSH: Cataract surgery, Bilateral ?Cataract Surgery.., Bilateral ?Cholecystectomy (laparoscopic) ?Remove Tonsils - 2008 ?Shoulder Joint Surgery, Left - 2018 ?Tubal Ligation - 2008 ? ?  ? ?GU PMH: Renal calculus - 10/01/2021, - 02/26/2021, - 01/08/2021, - 12/10/2020, - 11/06/2020, - 10/03/2020, Nephrolithiasis, - 2017 ?Ureteral  calculus - 12/10/2020, - 11/06/2020 (Stable), - 10/26/2020, Calculus of ureter, - 2014 ?Flank Pain (Worsening, Chronic), Right, Culture urine. No ABX unless culture proven UTI. Reassured no acute finding today today explain flank pain. Recommend she alternate Tylenol/NSAID and heat/ice for pain. If pain persist or worsens may need CT urogram. - 2019 ?Unspecified condition associated with female genital organs and menstrual cycle, Adnexal mass - 2016 ?Other microscopic hematuria, Microscopic hematuria - 2015 ?  ?   ?PMH Notes:  ? ?1) Urolithiasis: She has a history of calcium oxalate urolithiasis. She has a history of low urine volume and hyperoxaluria.  ? ?Current treatment: Fluid hydration, calcium citrate, low oxalate diet, high citrate diet  ?Prior treatment: HCTZ (unable to tolerate)  ? ?1995: ESWL  ?Apr 2013: ESWL 8 mm left ureteral stone  ?June 2013: ESWL of multiple left renal calculi  ?Aug 2013: 24 hr urine - low urine volume, hyperoxaluria  ?Feb 2014: 24 hr urine - Stone risk significantly reduced  ?Apr 2015: 24 hr urine - Calcium and oxalate increased  ?Dec 2015: 24 hr urine - Hypercalciuria and high urine sodium - increased HCTZ to 25 mg q am and 12.5 mg q pm, reduce dietary sodium  ?Jul 2016: 24 hr urine - Hyeroxaluria and hypocitraturia - not compliant with calcium treatment -- plan to increase calcium supplement compliance  ?Dec 2016: ESWL of right renal calculus  ?Mar 2017: ESWL of left renal calculus  ?Mar 2021: R PCNL  ?May 2021: 24 hr urine - Low urine volume, hyperoxaluria, hypocitraturia  ?Nov 2021: Left ureteroscopic laser lithotripsy (calcium oxalate monohydrate mostly)  ?Dec  2021: Left ureteroscopic laser lithotripsy (migration of renal calculi), discussed PCNL but patient elected staged ureteroscopic treatment  ?Jan 2022: 2nd stage left ureteroscopic laser lithotripsy  ?  ? ?NON-GU PMH: Anxiety, Anxiety - 2014 ?Irritable bowel syndrome with diarrhea, Irritable Bowel Syndrome -  2014 ?GERD ?Hypertension ?  ? ?FAMILY HISTORY: Acute Myocardial Infarction - Mother, Runs In Family ?Chronic Obstructive Pulmonary Disease - Mother ?Diabetes - Runs In Family ?Ischemic Stroke - Mother  ? ?SOCIAL HISTORY: Marital Status: Married ?Preferred Language: [place]; Ethnicity: Not Hispanic Or Latino; Race: White ?Current Smoking Status: Patient has never smoked.  ?Does not use smokeless tobacco. ?Types of alcohol consumed: Wine. Social Drinker.  ?Does not use drugs. ?Does not drink caffeine. ?Has not had a blood transfusion. ?  ? ?REVIEW OF SYSTEMS:    ?GU Review Female:   Patient denies frequent urination, hard to postpone urination, burning /pain with urination, get up at night to urinate, leakage of urine, stream starts and stops, trouble starting your stream, have to strain to urinate, and currently pregnant.  ?Gastrointestinal (Lower):   Patient denies diarrhea and constipation.  ?Gastrointestinal (Upper):   Patient denies nausea and vomiting.  ?Constitutional:   Patient denies fever, night sweats, weight loss, and fatigue.  ?Skin:   Patient denies skin rash/ lesion and itching.  ?Eyes:   Patient denies blurred vision and double vision.  ?Ears/ Nose/ Throat:   Patient denies sore throat and sinus problems.  ?Hematologic/Lymphatic:   Patient denies swollen glands and easy bruising.  ?Cardiovascular:   Patient denies leg swelling and chest pains.  ?Respiratory:   Patient denies cough and shortness of breath.  ?Endocrine:   Patient denies excessive thirst.  ?Musculoskeletal:   Patient denies back pain and joint pain.  ?Neurological:   Patient denies headaches and dizziness.  ?Psychologic:   Patient denies depression and anxiety.  ? ?VITAL SIGNS:    ?  02/12/2022 12:53 PM  ?Weight 168 lb / 76.2 kg  ?Height 61 in / 154.94 cm  ?BP 138/85 mmHg  ?Pulse 81 /min  ?Temperature 97.1 F / 36.1 C  ?BMI 31.7 kg/m?  ? ?MULTI-SYSTEM PHYSICAL EXAMINATION:    ?Constitutional: Well-nourished. No physical deformities.  Normally developed. Good grooming.  ?Respiratory: No labored breathing, no use of accessory muscles.   ?Cardiovascular: Normal temperature, normal extremity pulses, no swelling, no varicosities.  ?Gastrointestinal: No CVA tenderness.  ? ?  ?Complexity of Data:  ?Records Review:   Previous Patient Records  ?X-Ray Review: KUB: Reviewed Films.  ?  ?Notes:                     I independently reviewed her KUB x-ray. This demonstrates enlarging stone burden of the right kidney measuring now approximately 1.4 cm which is increased from 1.2 cm at her last visit in October. The previously noted lower pole calculus is not well seen today. This may be the stone that she had passed or possibly related to overlying bowel gas. She does have stable left upper pole renal stone burden.  ? ?PROCEDURES:    ?     KUB - 08144  ?A single view of the abdomen is obtained.  ?  ?  ?Patient confirmed No Neulasta OnPro Device. ? ?  ? ? ?     Urinalysis w/Scope ?Dipstick Dipstick Cont'd Micro  ?Color: Yellow Bilirubin: Neg mg/dL WBC/hpf: 0 - 5/hpf  ?Appearance: Clear Ketones: Neg mg/dL RBC/hpf: NS (Not Seen)  ?Specific Gravity: 1.025 Blood: Neg ery/uL Bacteria:  Rare (0-9/hpf)  ?pH: 6.0 Protein: Neg mg/dL Cystals: NS (Not Seen)  ?Glucose: Neg mg/dL Urobilinogen: 0.2 mg/dL Casts: NS (Not Seen)  ?  Nitrites: Neg Trichomonas: Not Present  ?  Leukocyte Esterase: Trace leu/uL Mucous: Not Present  ?    Epithelial Cells: 0 - 5/hpf  ?    Yeast: NS (Not Seen)  ?    Sperm: Not Present  ? ? ?Notes: QNS for spun micro ?  ? ?ASSESSMENT:  ?    ICD-10 Details  ?1 GU:   Renal calculus - N20.0   ? ?PLAN:    ? ?      Schedule ?Return Visit/Planned Activity: Other See Visit Notes  ?           Note: Will call with stone analysis and to set up surgery.  ? ? ?      Document ?Letter(s):  Created for Patient: Clinical Summary  ? ? ?     Notes:   1. Urolithiasis: We reviewed her imaging today and discussed options including close surveillance versus proactive treatment.  After discussion, she is interested in proactive treatment of her large right renal stone burden. After reviewing the pros and cons of shockwave lithotripsy and ureteroscopic laser lithotripsy, she is in agreement to let

## 2022-03-27 ENCOUNTER — Ambulatory Visit (HOSPITAL_COMMUNITY): Payer: Medicare PPO

## 2022-03-27 ENCOUNTER — Ambulatory Visit (HOSPITAL_BASED_OUTPATIENT_CLINIC_OR_DEPARTMENT_OTHER): Payer: Medicare PPO | Admitting: Anesthesiology

## 2022-03-27 ENCOUNTER — Encounter (HOSPITAL_COMMUNITY)
Admission: RE | Disposition: A | Discharge status: Home / Self Care | Payer: Self-pay | Source: Ambulatory Visit | Attending: Urology

## 2022-03-27 ENCOUNTER — Ambulatory Visit (HOSPITAL_COMMUNITY): Payer: Medicare PPO | Admitting: Physician Assistant

## 2022-03-27 ENCOUNTER — Encounter (HOSPITAL_COMMUNITY): Payer: Self-pay | Admitting: Urology

## 2022-03-27 ENCOUNTER — Ambulatory Visit (HOSPITAL_COMMUNITY)
Admission: RE | Admit: 2022-03-27 | Discharge: 2022-03-27 | Disposition: A | Discharge status: Home / Self Care | Payer: Medicare PPO | Source: Ambulatory Visit | Attending: Urology | Admitting: Urology

## 2022-03-27 DIAGNOSIS — I10 Essential (primary) hypertension: Secondary | ICD-10-CM | POA: Insufficient documentation

## 2022-03-27 DIAGNOSIS — Z79899 Other long term (current) drug therapy: Secondary | ICD-10-CM | POA: Insufficient documentation

## 2022-03-27 DIAGNOSIS — N200 Calculus of kidney: Secondary | ICD-10-CM | POA: Diagnosis not present

## 2022-03-27 DIAGNOSIS — E119 Type 2 diabetes mellitus without complications: Secondary | ICD-10-CM | POA: Insufficient documentation

## 2022-03-27 HISTORY — PX: CYSTOSCOPY/URETEROSCOPY/HOLMIUM LASER/STENT PLACEMENT: SHX6546

## 2022-03-27 LAB — GLUCOSE, CAPILLARY: Glucose-Capillary: 83 mg/dL (ref 70–99)

## 2022-03-27 SURGERY — CYSTOSCOPY/URETEROSCOPY/HOLMIUM LASER/STENT PLACEMENT
Anesthesia: General | Laterality: Right

## 2022-03-27 MED ORDER — LIDOCAINE 2% (20 MG/ML) 5 ML SYRINGE
INTRAMUSCULAR | Status: DC | PRN
  Administered 2022-03-27: 60 mg via INTRAVENOUS

## 2022-03-27 MED ORDER — LACTATED RINGERS IV SOLN: INTRAVENOUS | Status: DC

## 2022-03-27 MED ORDER — AMISULPRIDE (ANTIEMETIC) 5 MG/2ML IV SOLN: 10.0000 mg | Freq: Once | INTRAVENOUS | Status: DC | PRN

## 2022-03-27 MED ORDER — DEXAMETHASONE SODIUM PHOSPHATE 10 MG/ML IJ SOLN
INTRAMUSCULAR | Status: DC | PRN
  Administered 2022-03-27: 10 mg via INTRAVENOUS

## 2022-03-27 MED ORDER — 0.9 % SODIUM CHLORIDE (POUR BTL) OPTIME
TOPICAL | Status: DC | PRN
  Administered 2022-03-27: 1000 mL

## 2022-03-27 MED ORDER — GENTAMICIN SULFATE 40 MG/ML IJ SOLN
5.0000 mg/kg | Freq: Once | INTRAVENOUS | Status: AC
  Administered 2022-03-27: 383.6 mg via INTRAVENOUS
  Filled 2022-03-27: qty 9.5

## 2022-03-27 MED ORDER — SODIUM CHLORIDE 0.9 % IR SOLN
Status: DC | PRN
  Administered 2022-03-27: 3000 mL

## 2022-03-27 MED ORDER — FENTANYL CITRATE PF 50 MCG/ML IJ SOSY: 25.0000 ug | PREFILLED_SYRINGE | INTRAMUSCULAR | Status: DC | PRN

## 2022-03-27 MED ORDER — PROPOFOL 10 MG/ML IV BOLUS
INTRAVENOUS | Status: DC | PRN
  Administered 2022-03-27: 200 mg via INTRAVENOUS

## 2022-03-27 MED ORDER — ONDANSETRON HCL 4 MG/2ML IJ SOLN
INTRAMUSCULAR | Status: DC | PRN
  Administered 2022-03-27: 4 mg via INTRAVENOUS

## 2022-03-27 MED ORDER — ORAL CARE MOUTH RINSE: 15.0000 mL | Freq: Once | OROMUCOSAL | Status: DC

## 2022-03-27 MED ORDER — IOHEXOL 300 MG/ML  SOLN
INTRAMUSCULAR | Status: DC | PRN
Start: 2022-03-27 — End: 2022-03-27
  Administered 2022-03-27: 10 mL via URETHRAL

## 2022-03-27 MED ORDER — CHLORHEXIDINE GLUCONATE 0.12 % MT SOLN: 15.0000 mL | Freq: Once | OROMUCOSAL | Status: DC

## 2022-03-27 MED ORDER — ACETAMINOPHEN 500 MG PO TABS: 1000.0000 mg | ORAL_TABLET | Freq: Once | ORAL | Status: DC

## 2022-03-27 MED ORDER — TRAMADOL HCL 50 MG PO TABS: 50.0000 mg | ORAL_TABLET | Freq: Four times a day (QID) | ORAL | 0 refills | Status: DC | PRN

## 2022-03-27 SURGICAL SUPPLY — 23 items
BAG COUNTER SPONGE SURGICOUNT (BAG) IMPLANT
BAG SPNG CNTER NS LX DISP (BAG)
BAG URO CATCHER STRL LF (MISCELLANEOUS) ×3 IMPLANT
BASKET ZERO TIP NITINOL 2.4FR (BASKET) ×1 IMPLANT
BSKT STON RTRVL ZERO TP 2.4FR (BASKET) ×1
CATH URETL OPEN END 6FR 70 (CATHETERS) IMPLANT
CLOTH BEACON ORANGE TIMEOUT ST (SAFETY) ×3 IMPLANT
GLOVE SURG ENC TEXT LTX SZ7.5 (GLOVE) ×3 IMPLANT
GUIDEWIRE STR DUAL SENSOR (WIRE) ×3 IMPLANT
GUIDEWIRE ZIPWRE .038 STRAIGHT (WIRE) IMPLANT
IV NS 1000ML (IV SOLUTION) ×2
IV NS 1000ML BAXH (IV SOLUTION) ×2 IMPLANT
KIT TURNOVER KIT A (KITS) IMPLANT
LASER FIB FLEXIVA PULSE ID 365 (Laser) IMPLANT
MANIFOLD NEPTUNE II (INSTRUMENTS) ×3 IMPLANT
PACK CYSTO (CUSTOM PROCEDURE TRAY) ×3 IMPLANT
SHEATH NAVIGATOR HD 11/13X36 (SHEATH) IMPLANT
SHEATH URETERAL 12FRX35CM (MISCELLANEOUS) ×1 IMPLANT
STENT URET 6FRX24 CONTOUR (STENTS) ×1 IMPLANT
TRACTIP FLEXIVA PULS ID 200XHI (Laser) IMPLANT
TRACTIP FLEXIVA PULSE ID 200 (Laser) ×1 IMPLANT
TUBING CONNECTING 10 (TUBING) ×3 IMPLANT
TUBING UROLOGY SET (TUBING) ×3 IMPLANT

## 2022-03-27 NOTE — Anesthesia Procedure Notes (Signed)
Procedure Name: LMA Insertion ?Date/Time: 03/27/2022 1:13 PM ?Performed by: [place] Kyair Ditommaso C, CRNA ?Pre-anesthesia Checklist: Patient identified, Emergency Drugs available, Suction available and Patient being monitored ?Patient Re-evaluated:Patient Re-evaluated prior to induction ?Oxygen Delivery Method: Circle system utilized ?Preoxygenation: Pre-oxygenation with 100% oxygen ?Induction Type: IV induction ?Ventilation: Mask ventilation without difficulty ?LMA: LMA inserted ?LMA Size: 4.0 ?Number of attempts: 1 ?Airway Equipment and Method: Bite block ?Placement Confirmation: positive ETCO2 ?Tube secured with: Tape ?Dental Injury: Teeth and Oropharynx as per pre-operative assessment  ? ? ? ? ?

## 2022-03-27 NOTE — Transfer of Care (Signed)
Immediate Anesthesia Transfer of Care Note ? ?Patient: Victoria Andrade ? ?Procedure(s) Performed: CYSTOSCOPY/RETROGRADE/URETEROSCOPY/HOLMIUM LASER/STENT PLACEMENT (Right) ? ?Patient Location: PACU ? ?Anesthesia Type:General ? ?Level of Consciousness: awake, alert  and oriented ? ?Airway & Oxygen Therapy: Patient Spontanous Breathing ? ?Post-op Assessment: Report given to RN and Post -op Vital signs reviewed and stable ? ?Post vital signs: Reviewed and stable ? ?Last Vitals:  ?Vitals Value Taken Time  ?BP    ?Temp    ?Pulse    ?Resp    ?SpO2    ? ? ?Last Pain:  ?Vitals:  ? 03/27/22 1149  ?TempSrc:   ?PainSc: 0-No pain  ?   ? ?  ? ?Complications: No notable events documented. ?

## 2022-03-27 NOTE — Interval H&P Note (Signed)
History and Physical Interval Note: ? ?03/27/2022 ?12:53 PM ? ?Victoria Andrade  has presented today for surgery, with the diagnosis of RIGHT RENAL CALCULI.  The various methods of treatment have been discussed with the patient and family. After consideration of risks, benefits and other options for treatment, the patient has consented to  Procedure(s): ?CYSTOSCOPY/RETROGRADE/URETEROSCOPY/HOLMIUM LASER/STENT PLACEMENT (Right) as a surgical intervention.  The patient's history has been reviewed, patient examined, no change in status, stable for surgery.  I have reviewed the patient's chart and labs.  Questions were answered to the patient's satisfaction.   ? ? ?Les [name] ? ? ?

## 2022-03-27 NOTE — Anesthesia Postprocedure Evaluation (Signed)
Anesthesia Post Note ? ?Patient: Victoria Andrade ? ?Procedure(s) Performed: CYSTOSCOPY/RETROGRADE/URETEROSCOPY/HOLMIUM LASER/STENT PLACEMENT (Right) ? ?  ? ?Patient location during evaluation: PACU ?Anesthesia Type: General ?Level of consciousness: awake and alert ?Pain management: pain level controlled ?Vital Signs Assessment: post-procedure vital signs reviewed and stable ?Respiratory status: spontaneous breathing, nonlabored ventilation, respiratory function stable and patient connected to nasal cannula oxygen ?Cardiovascular status: blood pressure returned to baseline and stable ?Postop Assessment: no apparent nausea or vomiting ?Anesthetic complications: no ? ? ?No notable events documented. ? ?Last Vitals:  ?Vitals:  ? 03/27/22 1430 03/27/22 1445  ?BP: (!) 157/66 (!) 163/68  ?Pulse: 73 76  ?Resp: 13 13  ?Temp:  36.5 ?C  ?SpO2: 100% 98%  ?  ?Last Pain:  ?Vitals:  ? 03/27/22 1445  ?TempSrc:   ?PainSc: 0-No pain  ? ? ?  ?  ?  ?  ?  ?  ? ?Suzette Battiest E ? ? ? ? ?

## 2022-03-27 NOTE — Anesthesia Preprocedure Evaluation (Addendum)
Anesthesia Evaluation  ?Patient identified by MRN, date of birth, ID band ?Patient awake ? ? ? ?Reviewed: ?Allergy & Precautions, NPO status , Patient's Chart, lab work & pertinent test results ? ?History of Anesthesia Complications ?(+) PONV and history of anesthetic complications ? ?Airway ?Mallampati: II ? ?TM Distance: >3 FB ?Neck ROM: Full ? ? ? Dental ? ?(+) Dental Advisory Given ?  ?Pulmonary ?neg pulmonary ROS,  ?  ?breath sounds clear to auscultation ? ? ? ? ? ? Cardiovascular ?hypertension, Pt. on medications ? ?Rhythm:Regular Rate:Normal ? ? ?  ?Neuro/Psych ? Headaches,   ? GI/Hepatic ?Neg liver ROS, hiatal hernia, GERD  ,  ?Endo/Other  ?diabetes, Type 2 ? Renal/GU ?Renal disease  ? ?  ?Musculoskeletal ? ?(+) Fibromyalgia - ? Abdominal ?  ?Peds ? Hematology ?negative hematology ROS ?(+)   ?Anesthesia Other Findings ? ? Reproductive/Obstetrics ? ?  ? ? ? ? ? ? ? ? ? ? ? ? ? ?  ?  ? ? ? ? ? ? ? ? ?Anesthesia Physical ?Anesthesia Plan ? ?ASA: 2 ? ?Anesthesia Plan: General  ? ?Post-op Pain Management: Tylenol PO (pre-op)* and Minimal or no pain anticipated  ? ?Induction: Intravenous ? ?PONV Risk Score and Plan: 4 or greater and Dexamethasone, Ondansetron, Midazolam and Treatment may vary due to age or medical condition ? ?Airway Management Planned: LMA ? ?Additional Equipment: None ? ?Intra-op Plan:  ? ?Post-operative Plan: Extubation in OR ? ?Informed Consent: I have reviewed the patients History and Physical, chart, labs and discussed the procedure including the risks, benefits and alternatives for the proposed anesthesia with the patient or authorized representative who has indicated his/her understanding and acceptance.  ? ? ? ?Dental advisory given ? ?Plan Discussed with: CRNA ? ?Anesthesia Plan Comments:   ? ? ? ? ? ? ?Anesthesia Quick Evaluation ? ?

## 2022-03-27 NOTE — Discharge Instructions (Signed)
You may see some blood in the urine and may have some burning with urination for 48-72 hours. You also may notice that you have to urinate more frequently or urgently after your procedure which is normal.  You should call should you develop an inability urinate, fever > 101, persistent nausea and vomiting that prevents you from eating or drinking to stay hydrated.  If you have a stent, you will likely urinate more frequently and urgently until the stent is removed and you may experience some discomfort/pain in the lower abdomen and flank especially when urinating. You may take pain medication prescribed to you if needed for pain. You may also intermittently have blood in the urine until the stent is removed. You may remove your stent on Monday morning.  Simply pull the string that is taped to your body and the stent will easily come out.  This may be best done in the shower as some urine may come out with the stent.  Usually you will feel relief once the stent is removed, but occasionally patients can develop pain due to residual swelling of the ureter that may temporarily obstruct the kidney.  This can be managed by taking pain medication and it will typically resolve with time.  Please do not hesitate to call if you have pain that is not controlled with your pain medication or does not improved within 24-48 hours.  

## 2022-03-27 NOTE — Op Note (Signed)
Preoperative diagnosis: Right renal calculi ? ?Postoperative diagnosis: Right renal calculi ? ?Procedure: ? ?Cystoscopy ?Right ureteroscopy and stone removal ?Ureteroscopic laser lithotripsy ?Right ureteral stent placement (6 x 24 with string) ?Right retrograde pyelography with interpretation ? ?Surgeon: Roxy Horseman, Brooke Bonito. M.D. ? ?Anesthesia: General ? ?Complications: None ? ?Intraoperative findings: Right retrograde pyelography demonstrated a filling defect within the right lower pole consistent with the patient?s known calculus without other abnormalities noted. ? ?EBL: Minimal ? ?Specimens: ?Right renal calculus ? ?Disposition of specimens: [facility] for stone analysis ? ?Indication: Victoria Andrade  is a 76 y.o. patient with urolithiasis. After reviewing the management options for treatment, they elected to proceed with the above surgical procedure(s). We have discussed the potential benefits and risks of the procedure, side effects of the proposed treatment, the likelihood of the patient achieving the goals of the procedure, and any potential problems that might occur during the procedure or recuperation. Informed consent has been obtained. ? ?Description of procedure: ? ?The patient was taken to the operating room and general anesthesia was induced.  The patient was placed in the dorsal lithotomy position, prepped and draped in the usual sterile fashion, and preoperative antibiotics were administered. A preoperative time-out was performed.  ? ?Cystourethroscopy was performed.  The patient?s urethra was examined and was normal. The bladder was then systematically examined in its entirety. There was no evidence for any bladder tumors, stones, or other mucosal pathology.   ? ?Attention then turned to the right ureteral orifice and a ureteral catheter was used to intubate the ureteral orifice.  Omnipaque contrast was injected through the ureteral catheter and a retrograde pyelogram was  performed with findings as dictated above. ? ?A 0.38 sensor guidewire was then advanced up the right ureter into the renal pelvis under fluoroscopic guidance.  A 12/14 Fr ureteral access sheath was then advanced over the guide wire. The digital flexible ureteroscope was then advanced through the access sheath into the ureter next to the guidewire and the calculus was identified and was located in the right lower pole. ? ? The stone was then fragmented with the 200 micron holmium laser fiber on a setting of 0.5J and frequency of 50 Hz.  ? ?All sizable stones were then removed with a zero tip nitinol basket.  Reinspection of the ureter/renal pelvis revealed no remaining visible stones or fragments of significant size.  ? ?The safety wire was then replaced and the access sheath removed.  The guidewire was backloaded through the cystoscope and a ureteral stent was advance over the wire using Seldinger technique.  The stent was positioned appropriately under fluoroscopic and cystoscopic guidance.  The wire was then removed with an adequate stent curl noted in the renal pelvis as well as in the bladder. ? ?The bladder was then emptied and the procedure ended.  The patient appeared to tolerate the procedure well and without complications.  The patient was able to be awakened and transferred to the recovery unit in satisfactory condition.  ? ?Pryor Curia MD ? ?

## 2022-03-28 ENCOUNTER — Encounter (HOSPITAL_COMMUNITY): Payer: Self-pay | Admitting: Urology

## 2022-04-07 DIAGNOSIS — E782 Mixed hyperlipidemia: Secondary | ICD-10-CM | POA: Diagnosis not present

## 2022-04-07 DIAGNOSIS — I10 Essential (primary) hypertension: Secondary | ICD-10-CM | POA: Diagnosis not present

## 2022-04-07 DIAGNOSIS — F419 Anxiety disorder, unspecified: Secondary | ICD-10-CM | POA: Diagnosis not present

## 2022-04-07 DIAGNOSIS — E118 Type 2 diabetes mellitus with unspecified complications: Secondary | ICD-10-CM | POA: Diagnosis not present

## 2022-04-07 DIAGNOSIS — E7849 Other hyperlipidemia: Secondary | ICD-10-CM | POA: Diagnosis not present

## 2022-04-07 DIAGNOSIS — F329 Major depressive disorder, single episode, unspecified: Secondary | ICD-10-CM | POA: Diagnosis not present

## 2022-04-07 DIAGNOSIS — Z6831 Body mass index (BMI) 31.0-31.9, adult: Secondary | ICD-10-CM | POA: Diagnosis not present

## 2022-04-07 DIAGNOSIS — Z0001 Encounter for general adult medical examination with abnormal findings: Secondary | ICD-10-CM | POA: Diagnosis not present

## 2022-04-07 DIAGNOSIS — Z1331 Encounter for screening for depression: Secondary | ICD-10-CM | POA: Diagnosis not present

## 2022-04-07 DIAGNOSIS — E559 Vitamin D deficiency, unspecified: Secondary | ICD-10-CM | POA: Diagnosis not present

## 2022-04-14 DIAGNOSIS — M7552 Bursitis of left shoulder: Secondary | ICD-10-CM | POA: Diagnosis not present

## 2022-04-14 DIAGNOSIS — M3589 Other specified systemic involvement of connective tissue: Secondary | ICD-10-CM | POA: Diagnosis not present

## 2022-04-14 DIAGNOSIS — M3500 Sicca syndrome, unspecified: Secondary | ICD-10-CM | POA: Diagnosis not present

## 2022-04-14 DIAGNOSIS — M1990 Unspecified osteoarthritis, unspecified site: Secondary | ICD-10-CM | POA: Diagnosis not present

## 2022-04-14 DIAGNOSIS — M25511 Pain in right shoulder: Secondary | ICD-10-CM | POA: Diagnosis not present

## 2022-05-06 DIAGNOSIS — N200 Calculus of kidney: Secondary | ICD-10-CM | POA: Diagnosis not present

## 2022-06-23 ENCOUNTER — Other Ambulatory Visit: Payer: Self-pay | Admitting: Obstetrics and Gynecology

## 2022-06-23 DIAGNOSIS — Z1231 Encounter for screening mammogram for malignant neoplasm of breast: Secondary | ICD-10-CM

## 2022-07-04 DIAGNOSIS — Z7985 Long-term (current) use of injectable non-insulin antidiabetic drugs: Secondary | ICD-10-CM | POA: Diagnosis not present

## 2022-07-04 DIAGNOSIS — E119 Type 2 diabetes mellitus without complications: Secondary | ICD-10-CM | POA: Diagnosis not present

## 2022-07-04 DIAGNOSIS — H35362 Drusen (degenerative) of macula, left eye: Secondary | ICD-10-CM | POA: Diagnosis not present

## 2022-07-04 DIAGNOSIS — H3552 Pigmentary retinal dystrophy: Secondary | ICD-10-CM | POA: Diagnosis not present

## 2022-07-04 DIAGNOSIS — Z961 Presence of intraocular lens: Secondary | ICD-10-CM | POA: Diagnosis not present

## 2022-07-15 DIAGNOSIS — L308 Other specified dermatitis: Secondary | ICD-10-CM | POA: Diagnosis not present

## 2022-07-15 DIAGNOSIS — L299 Pruritus, unspecified: Secondary | ICD-10-CM | POA: Diagnosis not present

## 2022-07-15 DIAGNOSIS — D224 Melanocytic nevi of scalp and neck: Secondary | ICD-10-CM | POA: Diagnosis not present

## 2022-07-15 DIAGNOSIS — B029 Zoster without complications: Secondary | ICD-10-CM | POA: Diagnosis not present

## 2022-07-25 ENCOUNTER — Ambulatory Visit: Payer: Medicare PPO

## 2022-08-01 ENCOUNTER — Ambulatory Visit: Payer: Medicare PPO

## 2022-08-01 DIAGNOSIS — E6609 Other obesity due to excess calories: Secondary | ICD-10-CM | POA: Diagnosis not present

## 2022-08-01 DIAGNOSIS — B0229 Other postherpetic nervous system involvement: Secondary | ICD-10-CM | POA: Diagnosis not present

## 2022-08-01 DIAGNOSIS — F419 Anxiety disorder, unspecified: Secondary | ICD-10-CM | POA: Diagnosis not present

## 2022-08-01 DIAGNOSIS — Z6831 Body mass index (BMI) 31.0-31.9, adult: Secondary | ICD-10-CM | POA: Diagnosis not present

## 2022-08-07 DIAGNOSIS — H35453 Secondary pigmentary degeneration, bilateral: Secondary | ICD-10-CM | POA: Diagnosis not present

## 2022-08-07 DIAGNOSIS — H35362 Drusen (degenerative) of macula, left eye: Secondary | ICD-10-CM | POA: Diagnosis not present

## 2022-08-07 DIAGNOSIS — Z83518 Family history of other specified eye disorder: Secondary | ICD-10-CM | POA: Diagnosis not present

## 2022-08-07 DIAGNOSIS — Z83511 Family history of glaucoma: Secondary | ICD-10-CM | POA: Diagnosis not present

## 2022-08-07 DIAGNOSIS — H3589 Other specified retinal disorders: Secondary | ICD-10-CM | POA: Diagnosis not present

## 2022-08-07 DIAGNOSIS — E1169 Type 2 diabetes mellitus with other specified complication: Secondary | ICD-10-CM | POA: Diagnosis not present

## 2022-08-11 ENCOUNTER — Ambulatory Visit: Payer: Medicare PPO

## 2022-08-14 ENCOUNTER — Ambulatory Visit
Admission: RE | Admit: 2022-08-14 | Discharge: 2022-08-14 | Disposition: A | Discharge status: Home / Self Care | Payer: Medicare PPO | Source: Ambulatory Visit | Attending: Obstetrics and Gynecology | Admitting: Obstetrics and Gynecology

## 2022-08-14 DIAGNOSIS — M25511 Pain in right shoulder: Secondary | ICD-10-CM | POA: Diagnosis not present

## 2022-08-14 DIAGNOSIS — M3500 Sicca syndrome, unspecified: Secondary | ICD-10-CM | POA: Diagnosis not present

## 2022-08-14 DIAGNOSIS — Z1231 Encounter for screening mammogram for malignant neoplasm of breast: Secondary | ICD-10-CM

## 2022-08-14 DIAGNOSIS — M7552 Bursitis of left shoulder: Secondary | ICD-10-CM | POA: Diagnosis not present

## 2022-08-14 DIAGNOSIS — M1990 Unspecified osteoarthritis, unspecified site: Secondary | ICD-10-CM | POA: Diagnosis not present

## 2022-08-14 DIAGNOSIS — M3589 Other specified systemic involvement of connective tissue: Secondary | ICD-10-CM | POA: Diagnosis not present

## 2022-08-18 ENCOUNTER — Other Ambulatory Visit: Payer: Self-pay | Admitting: Obstetrics and Gynecology

## 2022-08-18 DIAGNOSIS — R928 Other abnormal and inconclusive findings on diagnostic imaging of breast: Secondary | ICD-10-CM

## 2022-08-21 DIAGNOSIS — C8599 Non-Hodgkin lymphoma, unspecified, extranodal and solid organ sites: Secondary | ICD-10-CM | POA: Diagnosis not present

## 2022-08-21 DIAGNOSIS — H43811 Vitreous degeneration, right eye: Secondary | ICD-10-CM | POA: Diagnosis not present

## 2022-08-21 DIAGNOSIS — E119 Type 2 diabetes mellitus without complications: Secondary | ICD-10-CM | POA: Diagnosis not present

## 2022-08-21 DIAGNOSIS — H353131 Nonexudative age-related macular degeneration, bilateral, early dry stage: Secondary | ICD-10-CM | POA: Diagnosis not present

## 2022-08-26 ENCOUNTER — Ambulatory Visit
Admission: RE | Admit: 2022-08-26 | Discharge: 2022-08-26 | Disposition: A | Discharge status: Home / Self Care | Payer: Medicare PPO | Source: Ambulatory Visit | Attending: Obstetrics and Gynecology | Admitting: Obstetrics and Gynecology

## 2022-08-26 DIAGNOSIS — R928 Other abnormal and inconclusive findings on diagnostic imaging of breast: Secondary | ICD-10-CM

## 2022-08-26 DIAGNOSIS — R922 Inconclusive mammogram: Secondary | ICD-10-CM | POA: Diagnosis not present

## 2022-08-26 DIAGNOSIS — N6489 Other specified disorders of breast: Secondary | ICD-10-CM | POA: Diagnosis not present

## 2022-08-27 ENCOUNTER — Encounter: Payer: Self-pay | Admitting: *Deleted

## 2022-08-27 ENCOUNTER — Telehealth: Payer: Self-pay | Admitting: *Deleted

## 2022-08-27 NOTE — Patient Outreach (Signed)
  Care Coordination   Initial Visit Note   08/27/2022 Name: Victoria Andrade MRN: [id number] DOB: 04/19/1946  Victoria Andrade is a 76 y.o. year old female who sees Sharilyn Sites, MD for primary care. I spoke with  Servando Salina by phone today.  What matters to the patients health and wellness today?  Ongoing self health management    Goals Addressed             This Visit's Progress    Care Coordination Services (No Follow-up Required)       Care Coordination Interventions: Reviewed medications with patient and discussed affordability Provided patient and/or caregiver with verbal information about [place] (community resource) Assessed social determinant of health barriers Encouraged patient to request a referral from PCP if services are needed in the future        SDOH assessments and interventions completed:  Yes  SDOH Interventions Today    Flowsheet Row Most Recent Value  SDOH Interventions   Financial Strain Interventions Intervention Not Indicated  Housing Interventions Intervention Not Indicated  Transportation Interventions Intervention Not Indicated        Care Coordination Interventions Activated:  Yes  Care Coordination Interventions:  Yes, provided   Follow up plan: No further intervention required.   Encounter Outcome:  Pt. Visit Completed   Chong Sicilian, BSN, RN-BC [place] / [facility] Pharmacist, community Dial: [phone]

## 2022-09-02 DIAGNOSIS — E1169 Type 2 diabetes mellitus with other specified complication: Secondary | ICD-10-CM | POA: Diagnosis not present

## 2022-09-02 DIAGNOSIS — R635 Abnormal weight gain: Secondary | ICD-10-CM | POA: Diagnosis not present

## 2022-09-02 DIAGNOSIS — E785 Hyperlipidemia, unspecified: Secondary | ICD-10-CM | POA: Diagnosis not present

## 2022-12-05 DIAGNOSIS — E11610 Type 2 diabetes mellitus with diabetic neuropathic arthropathy: Secondary | ICD-10-CM | POA: Diagnosis not present

## 2022-12-05 DIAGNOSIS — E1169 Type 2 diabetes mellitus with other specified complication: Secondary | ICD-10-CM | POA: Diagnosis not present

## 2022-12-05 DIAGNOSIS — E1165 Type 2 diabetes mellitus with hyperglycemia: Secondary | ICD-10-CM | POA: Diagnosis not present

## 2022-12-05 DIAGNOSIS — E785 Hyperlipidemia, unspecified: Secondary | ICD-10-CM | POA: Diagnosis not present

## 2023-01-08 DIAGNOSIS — N200 Calculus of kidney: Secondary | ICD-10-CM | POA: Diagnosis not present

## 2023-01-15 DIAGNOSIS — N200 Calculus of kidney: Secondary | ICD-10-CM | POA: Diagnosis not present

## 2023-01-20 DIAGNOSIS — Z6831 Body mass index (BMI) 31.0-31.9, adult: Secondary | ICD-10-CM | POA: Diagnosis not present

## 2023-01-20 DIAGNOSIS — E6609 Other obesity due to excess calories: Secondary | ICD-10-CM | POA: Diagnosis not present

## 2023-01-20 DIAGNOSIS — C8599 Non-Hodgkin lymphoma, unspecified, extranodal and solid organ sites: Secondary | ICD-10-CM | POA: Diagnosis not present

## 2023-01-20 DIAGNOSIS — E782 Mixed hyperlipidemia: Secondary | ICD-10-CM | POA: Diagnosis not present

## 2023-01-20 DIAGNOSIS — M3507 Sjogren syndrome with central nervous system involvement: Secondary | ICD-10-CM | POA: Diagnosis not present

## 2023-01-29 ENCOUNTER — Other Ambulatory Visit: Payer: Self-pay | Admitting: Urology

## 2023-02-16 DIAGNOSIS — M7062 Trochanteric bursitis, left hip: Secondary | ICD-10-CM | POA: Diagnosis not present

## 2023-02-16 DIAGNOSIS — M3589 Other specified systemic involvement of connective tissue: Secondary | ICD-10-CM | POA: Diagnosis not present

## 2023-02-16 DIAGNOSIS — E785 Hyperlipidemia, unspecified: Secondary | ICD-10-CM | POA: Diagnosis not present

## 2023-02-16 DIAGNOSIS — M1990 Unspecified osteoarthritis, unspecified site: Secondary | ICD-10-CM | POA: Diagnosis not present

## 2023-02-16 DIAGNOSIS — E1165 Type 2 diabetes mellitus with hyperglycemia: Secondary | ICD-10-CM | POA: Diagnosis not present

## 2023-02-16 DIAGNOSIS — M25511 Pain in right shoulder: Secondary | ICD-10-CM | POA: Diagnosis not present

## 2023-02-16 DIAGNOSIS — M7552 Bursitis of left shoulder: Secondary | ICD-10-CM | POA: Diagnosis not present

## 2023-02-16 DIAGNOSIS — M3500 Sicca syndrome, unspecified: Secondary | ICD-10-CM | POA: Diagnosis not present

## 2023-02-19 ENCOUNTER — Ambulatory Visit: Admit: 2023-02-19 | Payer: Medicare PPO | Admitting: Urology

## 2023-02-19 SURGERY — CYSTOSCOPY/URETEROSCOPY/HOLMIUM LASER/STENT PLACEMENT
Anesthesia: General | Laterality: Left

## 2023-02-24 DIAGNOSIS — L918 Other hypertrophic disorders of the skin: Secondary | ICD-10-CM | POA: Diagnosis not present

## 2023-02-24 DIAGNOSIS — C44622 Squamous cell carcinoma of skin of right upper limb, including shoulder: Secondary | ICD-10-CM | POA: Diagnosis not present

## 2023-02-24 DIAGNOSIS — L821 Other seborrheic keratosis: Secondary | ICD-10-CM | POA: Diagnosis not present

## 2023-02-26 DIAGNOSIS — E119 Type 2 diabetes mellitus without complications: Secondary | ICD-10-CM | POA: Diagnosis not present

## 2023-02-26 DIAGNOSIS — H353131 Nonexudative age-related macular degeneration, bilateral, early dry stage: Secondary | ICD-10-CM | POA: Diagnosis not present

## 2023-02-26 DIAGNOSIS — H35453 Secondary pigmentary degeneration, bilateral: Secondary | ICD-10-CM | POA: Diagnosis not present

## 2023-02-26 DIAGNOSIS — H43811 Vitreous degeneration, right eye: Secondary | ICD-10-CM | POA: Diagnosis not present

## 2023-03-09 DIAGNOSIS — M79671 Pain in right foot: Secondary | ICD-10-CM | POA: Diagnosis not present

## 2023-03-09 DIAGNOSIS — S92351A Displaced fracture of fifth metatarsal bone, right foot, initial encounter for closed fracture: Secondary | ICD-10-CM | POA: Diagnosis not present

## 2023-03-19 DIAGNOSIS — S92351A Displaced fracture of fifth metatarsal bone, right foot, initial encounter for closed fracture: Secondary | ICD-10-CM | POA: Diagnosis not present

## 2023-04-20 DIAGNOSIS — M3500 Sicca syndrome, unspecified: Secondary | ICD-10-CM | POA: Diagnosis not present

## 2023-04-20 DIAGNOSIS — M7062 Trochanteric bursitis, left hip: Secondary | ICD-10-CM | POA: Diagnosis not present

## 2023-04-20 DIAGNOSIS — M25511 Pain in right shoulder: Secondary | ICD-10-CM | POA: Diagnosis not present

## 2023-04-20 DIAGNOSIS — M3589 Other specified systemic involvement of connective tissue: Secondary | ICD-10-CM | POA: Diagnosis not present

## 2023-04-20 DIAGNOSIS — M1990 Unspecified osteoarthritis, unspecified site: Secondary | ICD-10-CM | POA: Diagnosis not present

## 2023-04-22 DIAGNOSIS — E785 Hyperlipidemia, unspecified: Secondary | ICD-10-CM | POA: Diagnosis not present

## 2023-04-22 DIAGNOSIS — E1165 Type 2 diabetes mellitus with hyperglycemia: Secondary | ICD-10-CM | POA: Diagnosis not present

## 2023-04-28 DIAGNOSIS — Z6831 Body mass index (BMI) 31.0-31.9, adult: Secondary | ICD-10-CM | POA: Diagnosis not present

## 2023-04-28 DIAGNOSIS — F419 Anxiety disorder, unspecified: Secondary | ICD-10-CM | POA: Diagnosis not present

## 2023-04-28 DIAGNOSIS — E11610 Type 2 diabetes mellitus with diabetic neuropathic arthropathy: Secondary | ICD-10-CM | POA: Diagnosis not present

## 2023-04-28 DIAGNOSIS — C8599 Non-Hodgkin lymphoma, unspecified, extranodal and solid organ sites: Secondary | ICD-10-CM | POA: Diagnosis not present

## 2023-04-28 DIAGNOSIS — R0789 Other chest pain: Secondary | ICD-10-CM | POA: Diagnosis not present

## 2023-04-28 DIAGNOSIS — E1165 Type 2 diabetes mellitus with hyperglycemia: Secondary | ICD-10-CM | POA: Diagnosis not present

## 2023-04-28 DIAGNOSIS — E6609 Other obesity due to excess calories: Secondary | ICD-10-CM | POA: Diagnosis not present

## 2023-04-28 DIAGNOSIS — E782 Mixed hyperlipidemia: Secondary | ICD-10-CM | POA: Diagnosis not present

## 2023-04-28 DIAGNOSIS — I10 Essential (primary) hypertension: Secondary | ICD-10-CM | POA: Diagnosis not present

## 2023-04-29 ENCOUNTER — Other Ambulatory Visit: Payer: Self-pay | Admitting: Nurse Practitioner

## 2023-04-29 DIAGNOSIS — G4730 Sleep apnea, unspecified: Secondary | ICD-10-CM | POA: Diagnosis not present

## 2023-04-29 DIAGNOSIS — H409 Unspecified glaucoma: Secondary | ICD-10-CM | POA: Diagnosis not present

## 2023-04-29 DIAGNOSIS — E785 Hyperlipidemia, unspecified: Secondary | ICD-10-CM | POA: Diagnosis not present

## 2023-04-29 DIAGNOSIS — E1165 Type 2 diabetes mellitus with hyperglycemia: Secondary | ICD-10-CM | POA: Diagnosis not present

## 2023-04-29 DIAGNOSIS — E041 Nontoxic single thyroid nodule: Secondary | ICD-10-CM

## 2023-04-29 DIAGNOSIS — R809 Proteinuria, unspecified: Secondary | ICD-10-CM | POA: Diagnosis not present

## 2023-04-30 DIAGNOSIS — S92351A Displaced fracture of fifth metatarsal bone, right foot, initial encounter for closed fracture: Secondary | ICD-10-CM | POA: Diagnosis not present

## 2023-05-06 ENCOUNTER — Encounter: Payer: Self-pay | Admitting: Internal Medicine

## 2023-05-06 ENCOUNTER — Ambulatory Visit: Payer: Medicare PPO | Attending: Internal Medicine | Admitting: Internal Medicine

## 2023-05-06 VITALS — BP 138/68 | HR 70 | Ht 61.0 in | Wt 172.0 lb

## 2023-05-06 DIAGNOSIS — R079 Chest pain, unspecified: Secondary | ICD-10-CM

## 2023-05-06 LAB — BASIC METABOLIC PANEL
BUN/Creatinine Ratio: 16 (ref 12–28)
BUN: 17 mg/dL (ref 8–27)
CO2: 24 mmol/L (ref 20–29)
Calcium: 9.8 mg/dL (ref 8.7–10.3)
Chloride: 101 mmol/L (ref 96–106)
Creatinine, Ser: 1.06 mg/dL — ABNORMAL HIGH (ref 0.57–1.00)
Glucose: 179 mg/dL — ABNORMAL HIGH (ref 70–99)
Potassium: 4.1 mmol/L (ref 3.5–5.2)
Sodium: 142 mmol/L (ref 134–144)
eGFR: 55 mL/min/{1.73_m2} — ABNORMAL LOW (ref >59)

## 2023-05-06 MED ORDER — METOPROLOL TARTRATE 50 MG PO TABS: 50.0000 mg | ORAL_TABLET | Freq: Once | ORAL | 0 refills | Status: DC

## 2023-05-06 NOTE — Progress Notes (Signed)
Cardiology Office Note:    Date:  05/06/2023   ID:  Victoria Andrade, DOB 14-Apr-1946, MRN [id number]  PCP:  Assunta Found, MD   [facility] Providers Cardiologist:  None     Referring MD: Assunta Found, MD   No chief complaint on file. CP  History of Present Illness:    Victoria Andrade is a 77 y.o. female with a hx of well controlled DM2, referral for CP. She has no cardiac hx. Notes a few days of CP that radiates to her jaw two weeks ago. She notes she had trouble breathing. She notes she was taking hydroxychloroquine and thought this was contributing. She notices it off an on all day. No alleviating factors. Nothing makes it worse. No prior episodes.  No prior smoking.  She is retired She had a stress test years ago, which was normal BP ok today.  Past Medical History:  Diagnosis Date   Anxiety    takes xanax   Arthritis    fingers and neck   Cataract immature    Diabetes mellitus without complication (HCC)    Elevated hemoglobin A1c 02/2015   level 6.2   Family history of adverse reaction to anesthesia    uncle had a stroke following anesthesia   Fatty liver    Fibromyalgia    GERD (gastroesophageal reflux disease)    History of bronchitis    History of hiatal hernia    History of kidney stones    Hypertension    IBS (irritable bowel syndrome)    Imbalance    Macular degeneration    Melanoma (HCC)    left eye   Migraine    Neuropathy    Osteopenia 2016   PONV (postoperative nausea and vomiting)    Benadryl help with N/V since she can no longer have SCOP; difficulty to awaken   Shingles 2021    Past Surgical History:  Procedure Laterality Date   ABDOMINAL HYSTERECTOMY  1978   partial   BILATERAL SALPINGOOPHORECTOMY  2010   with robot   BREAST BIOPSY Right 03/06/2008   benign   CATARACT EXTRACTION, BILATERAL  2018   CHOLECYSTECTOMY  2005   lap   COLONOSCOPY     CYSTOSCOPY N/A 03/13/2015   Procedure: CYSTOSCOPY;  Surgeon: Patton Salles, MD;  Location: [facility];  Service: Gynecology;  Laterality: N/A;   CYSTOSCOPY WITH RETROGRADE PYELOGRAM, URETEROSCOPY AND STENT PLACEMENT Left 12/17/2020   Procedure: CYSTOSCOPY WITH LEFT RETROGRADE PYELOGRAM, URETEROSCOPY HOLMIUM LASER AND STENT PLACEMENT;  Surgeon: Heloise Purpura, MD;  Location: [facility];  Service: Urology;  Laterality: Left;  REQUESTING 2 HRS   CYSTOSCOPY/URETEROSCOPY/HOLMIUM LASER/STENT PLACEMENT Left 10/29/2020   Procedure: CYSTOSCOPY/RETROGRADE/URETEROSCOPY/HOLMIUM LASER/STENT PLACEMENT;  Surgeon: Heloise Purpura, MD;  Location: [facility];  Service: Urology;  Laterality: Left;   CYSTOSCOPY/URETEROSCOPY/HOLMIUM LASER/STENT PLACEMENT Left 01/21/2021   Procedure: CYSTOSCOPY/RETROGRADE/URETEROSCOPY/HOLMIUM LASER/STENT PLACEMENT;  Surgeon: Heloise Purpura, MD;  Location: [facility];  Service: Urology;  Laterality: Left;   CYSTOSCOPY/URETEROSCOPY/HOLMIUM LASER/STENT PLACEMENT Right 03/27/2022   Procedure: CYSTOSCOPY/RETROGRADE/URETEROSCOPY/HOLMIUM LASER/STENT PLACEMENT;  Surgeon: Heloise Purpura, MD;  Location: [facility];  Service: Urology;  Laterality: Right;   IR NEPHRO TUBE REMOV/FL  03/20/2020   IR NEPHROSTOGRAM RIGHT THRU EXISTING ACCESS  03/20/2020   IR URETERAL STENT RIGHT NEW ACCESS W/O SEP NEPHROSTOMY CATH  03/15/2020   LITHOTRIPSY     20 years ago   NEPHROLITHOTOMY Right 03/15/2020   Procedure: NEPHROLITHOTOMY PERCUTANEOUS/ INTERVENTIONAL RADIOLOCGY TO PLACE POSTERIOR CALYX PERCUTANEOUS ACCESS PRIOR;  Surgeon: Heloise Purpura, MD;  Location: [facility];  Service: Urology;  Laterality: Right;  NEEDS 150 MIN FOR PROCEDURE   ROBOTIC ASSISTED SALPINGO OOPHERECTOMY Left 03/13/2015   Procedure: ROBOTIC ASSISTED INCISION OF  LEFT PELVIC MASS, LYSIS OF ADHESIONS  WITH PELVIC WASHINGS;  Surgeon: Patton Salles, MD;  Location: [facility];  Service: Gynecology;  Laterality: Left;   SHOULDER SURGERY Left    bone spurs   TONSILLECTOMY     as child   TUBAL LIGATION  1976   UPPER GI ENDOSCOPY       Current Medications: Current Outpatient Medications on File Prior to Visit  Medication Sig Dispense Refill   ACCU-CHEK GUIDE test strip      Accu-Chek Softclix Lancets lancets      ALPRAZolam (XANAX) 1 MG tablet Take 1-1.5 mg by mouth every 8 (eight) hours as needed for anxiety.     Blood Glucose Monitoring Suppl (ACCU-CHEK GUIDE) w/Device KIT      Cholecalciferol (VITAMIN D3) 5000 UNITS CAPS Take 5,000 Units by mouth daily.      diphenhydrAMINE (BENADRYL) 25 MG tablet Take 25 mg by mouth at bedtime.     DULoxetine (CYMBALTA) 60 MG capsule Take 60 mg by mouth at bedtime.     ezetimibe (ZETIA) 10 MG tablet Take 10 mg by mouth daily.     famotidine (PEPCID) 20 MG tablet Take 20 mg by mouth daily as needed for heartburn or indigestion.      gabapentin (NEURONTIN) 100 MG capsule Take 100 mg by mouth 2 (two) times daily.     hydrochlorothiazide (HYDRODIURIL) 25 MG tablet Take 25 mg by mouth daily.     JARDIANCE 10 MG TABS tablet Take 10 mg by mouth daily.     LINZESS 290 MCG CAPS capsule Take 290 mcg by mouth daily as needed (constipation).     loratadine (CLARITIN) 10 MG tablet Take 10 mg by mouth daily.     montelukast (SINGULAIR) 10 MG tablet Take 10 mg by mouth daily.     Multiple Vitamins-Minerals (PRESERVISION AREDS) CAPS Take 1 capsule by mouth in the morning and at bedtime.     Polyethyl Glycol-Propyl Glycol (SYSTANE ULTRA PF OP) Place 1 drop into both eyes in the morning and at bedtime.      polyethylene glycol (MIRALAX / GLYCOLAX) 17 g packet Take 17 g by mouth daily.     rosuvastatin (CRESTOR) 5 MG tablet Take 5 mg by mouth daily.     Semaglutide,0.25 or 0.5MG /DOS, (OZEMPIC, 0.25 OR 0.5 MG/DOSE,) 2 MG/1.5ML SOPN Inject 0.5 mg into the skin every 7 (seven) days.     traMADol (ULTRAM) 50 MG tablet Take 1-2 tablets (50-100 mg total) by mouth every 6 (six) hours as needed (pain). 15 tablet 0   FIBER ADULT GUMMIES PO Take 2 tablets by mouth daily. (Patient not taking: Reported on  05/06/2023)     simethicone (MYLICON) 80 MG chewable tablet Chew 80 mg by mouth daily as needed (gas).  (Patient not taking: Reported on 05/06/2023)     No current facility-administered medications on file prior to visit.    Allergies:   Amitriptyline hcl, Bystolic [nebivolol hcl], Escitalopram, Latex, Metformin, Oxycodone, Prednisone, Adhesive [tape], Aspirin, Glimepiride, Glipizide, Hydrocodone, Other, Tramadol, Cephalosporins, Chlorhexidine, Ciprofloxacin, Codeine, and Penicillins   Social History   Socioeconomic History   Marital status: Widowed    Spouse name: Not on file   Number of children: Not on file   Years of education: Not  on file   Highest education level: Not on file  Occupational History   Not on file  Tobacco Use   Smoking status: Never   Smokeless tobacco: Never  Vaping Use   Vaping Use: Never used  Substance and Sexual Activity   Alcohol use: Yes    Comment: rarely   Drug use: No   Sexual activity: Not Currently    Partners: Male    Birth control/protection: Surgical    Comment: TAH 1978/BSO 2010  Other Topics Concern   Not on file  Social History Narrative   Not on file   Social Determinants of Health   Financial Resource Strain: Low Risk  (08/27/2022)   Overall Financial Resource Strain (CARDIA)    Difficulty of Paying Living Expenses: Not hard at all  Food Insecurity: Not on file  Transportation Needs: No Transportation Needs (08/27/2022)   PRAPARE - Transportation    Lack of Transportation (Medical): No    Lack of Transportation (Non-Medical): No  Physical Activity: Not on file  Stress: Not on file  Social Connections: Not on file     Family History: The patient's family history includes Diabetes in her maternal grandmother; Heart attack in her maternal grandmother, maternal uncle, and maternal uncle; Hypertension in her maternal grandfather, maternal grandmother, and mother; Lung cancer in her maternal uncle; Stroke in her maternal grandfather,  maternal uncle, and maternal uncle; Thyroid disease in her mother. She was adopted. Her aunt and uncle had MI prior to age 47  ROS:   Please see the history of present illness.     All other systems reviewed and are negative.  EKGs/Labs/Other Studies Reviewed:    The following studies were reviewed today:   EKG:  EKG is  ordered today.  The ekg ordered today demonstrates   05/06/2023- NSR, LVH, poor R wave progression  Recent Labs: No results found for requested labs within last 365 days.   Recent Lipid Panel No results found for: "CHOL", "TRIG", "HDL", "CHOLHDL", "VLDL", "LDLCALC", "LDLDIRECT"   Risk Assessment/Calculations:     Physical Exam:    VS: Vitals:   05/06/23 0826  BP: 138/68  Pulse: 70  SpO2: 95%      BP 138/68 (BP Location: Left Arm, Patient Position: Sitting, Cuff Size: Normal)   Pulse 70   Ht 5\' 1"  (1.549 m)   Wt 172 lb (78 kg)   LMP 12/29/1976 (Approximate)   SpO2 95%   BMI 32.50 kg/m     Wt Readings from Last 3 Encounters:  05/06/23 172 lb (78 kg)  03/27/22 168 lb 8 oz (76.4 kg)  03/25/22 168 lb 8 oz (76.4 kg)     GEN:  Well nourished, well developed in no acute distress HEENT: Normal NECK: No JVD; No carotid bruits CARDIAC: RRR, no murmurs, rubs, gallops RESPIRATORY:  Clear to auscultation without rales, wheezing or rhonchi  ABDOMEN: Soft, non-tender, non-distended MUSCULOSKELETAL:  No edema; No deformity  SKIN: Warm and dry NEUROLOGIC:  Alert and oriented x 3 PSYCHIATRIC:  Normal affect   ASSESSMENT:    Possible Cardiac CP: Her risk for CVD includes age. Her symptoms are not very typical , but a cardiac cause is possible.  PLAN:    In order of problems listed above:  Coronary CTA with morphology and metop tartrate 50 mg x1 Follow up PRN     Medication Adjustments/Labs and Tests Ordered: Current medicines are reviewed at length with the patient today.  Concerns regarding medicines are outlined above.  Orders Placed This  Encounter  Procedures   CT CORONARY MORPH W/CTA COR W/SCORE W/CA W/CM &/OR WO/CM   Basic metabolic panel   EKG 12-Lead   Meds ordered this encounter  Medications   metoprolol tartrate (LOPRESSOR) 50 MG tablet    Sig: Take 1 tablet (50 mg total) by mouth once for 1 dose. PLEASE TAKE METOPROLOL 2  HOURS PRIOR TO CTA SCAN.    Dispense:  1 tablet    Refill:  0    Patient Instructions  Medication Instructions:  Metoprolol Tartrate 50 mg 2 hours before CT *If you need a refill on your cardiac medications before your next appointment, please call your pharmacy*   Lab Work: BMP - today If you have labs (blood work) drawn today and your tests are completely normal, you will receive your results only by: MyChart Message (if you have MyChart) OR A paper copy in the mail If you have any lab test that is abnormal or we need to change your treatment, we will call you to review the results.   Testing/Procedures:   Your cardiac CT will be scheduled at one of the below locations:   [facility] [address] [place], [place] [address] [phone]   If scheduled at [facility], please arrive at the [facility] and Children's Entrance (Entrance C2) of [facility] 30 minutes prior to test start time. You can use the FREE valet parking offered at entrance C (encouraged to control the heart rate for the test)  Proceed to the [facility] Radiology Department (first floor) to check-in and test prep.  All radiology patients and guests should use entrance C2 at [facility], accessed from [facility], even though the hospital's physical address listed is [address]       Please follow these instructions carefully (unless otherwise directed):   On the Night Before the Test: Be sure to Drink plenty of water. Do not consume any caffeinated/decaffeinated beverages or chocolate 12 hours prior to your test. Do not take any  antihistamines 12 hours prior to your test.   On the Day of the Test: Drink plenty of water until 1 hour prior to the test. Do not eat any food 1 hour prior to test. You may take your regular medications prior to the test.  Take metoprolol (Lopressor) two hours prior to test. If you take Furosemide/Hydrochlorothiazide/Spironolactone, please HOLD on the morning of the test. FEMALES- please wear underwire-free bra if available, avoid dresses & tight clothing       After the Test: Drink plenty of water. After receiving IV contrast, you may experience a mild flushed feeling. This is normal. On occasion, you may experience a mild rash up to 24 hours after the test. This is not dangerous. If this occurs, you can take Benadryl 25 mg and increase your fluid intake. If you experience trouble breathing, this can be serious. If it is severe call 911 IMMEDIATELY. If it is mild, please call our office. If you take any of these medications: Glipizide/Metformin, Avandament, Glucavance, please do not take 48 hours after completing test unless otherwise instructed.  We will call to schedule your test 2-4 weeks out understanding that some insurance companies will need an authorization prior to the service being performed.   For non-scheduling related questions, please contact the cardiac imaging nurse navigator should you have any questions/concerns: Rockwell Alexandria, Cardiac Imaging Nurse Navigator Larey Brick, Cardiac Imaging Nurse Navigator [facility] Direct Office Dial: [phone]   For scheduling needs, including cancellations and rescheduling, please call [place], [phone].    Follow-Up: At [facility], you and your health needs are our priority.  As part of our continuing mission to provide you with exceptional heart care, we have created designated Provider Care Teams.  These Care Teams include your primary Cardiologist (physician) and Advanced  Practice Providers (APPs -  Physician Assistants and Nurse Practitioners) who all work together to provide you with the care you need, when you need it.  We recommend signing up for the patient portal called "MyChart".  Sign up information is provided on this After Visit Summary.  MyChart is used to connect with patients for Virtual Visits (Telemedicine).  Patients are able to view lab/test results, encounter notes, upcoming appointments, etc.  Non-urgent messages can be sent to your provider as well.   To learn more about what you can do with MyChart, go to [email or web address].    Your next appointment:    Follow up as needed  Provider:   Dr Wyline Mood    Signed, Maisie Fus, MD  05/06/2023 9:02 AM    [facility]

## 2023-05-06 NOTE — Patient Instructions (Signed)
Medication Instructions:  Metoprolol Tartrate 50 mg 2 hours before CT *If you need a refill on your cardiac medications before your next appointment, please call your pharmacy*   Lab Work: BMP - today If you have labs (blood work) drawn today and your tests are completely normal, you will receive your results only by: MyChart Message (if you have MyChart) OR A paper copy in the mail If you have any lab test that is abnormal or we need to change your treatment, we will call you to review the results.   Testing/Procedures:   Your cardiac CT will be scheduled at one of the below locations:   [facility] [address] [place], [place] [address] [phone]   If scheduled at [facility], please arrive at the [facility] and Children's Entrance (Entrance C2) of [facility] 30 minutes prior to test start time. You can use the FREE valet parking offered at entrance C (encouraged to control the heart rate for the test)  Proceed to the [facility] Radiology Department (first floor) to check-in and test prep.  All radiology patients and guests should use entrance C2 at [facility], accessed from [facility], even though the hospital's physical address listed is [address]       Please follow these instructions carefully (unless otherwise directed):   On the Night Before the Test: Be sure to Drink plenty of water. Do not consume any caffeinated/decaffeinated beverages or chocolate 12 hours prior to your test. Do not take any antihistamines 12 hours prior to your test.   On the Day of the Test: Drink plenty of water until 1 hour prior to the test. Do not eat any food 1 hour prior to test. You may take your regular medications prior to the test.  Take metoprolol (Lopressor) two hours prior to test. If you take Furosemide/Hydrochlorothiazide/Spironolactone, please HOLD on the morning of the test. FEMALES- please wear  underwire-free bra if available, avoid dresses & tight clothing       After the Test: Drink plenty of water. After receiving IV contrast, you may experience a mild flushed feeling. This is normal. On occasion, you may experience a mild rash up to 24 hours after the test. This is not dangerous. If this occurs, you can take Benadryl 25 mg and increase your fluid intake. If you experience trouble breathing, this can be serious. If it is severe call 911 IMMEDIATELY. If it is mild, please call our office. If you take any of these medications: Glipizide/Metformin, Avandament, Glucavance, please do not take 48 hours after completing test unless otherwise instructed.  We will call to schedule your test 2-4 weeks out understanding that some insurance companies will need an authorization prior to the service being performed.   For non-scheduling related questions, please contact the cardiac imaging nurse navigator should you have any questions/concerns: Rockwell Alexandria, Cardiac Imaging Nurse Navigator Larey Brick, Cardiac Imaging Nurse Navigator [facility] Direct Office Dial: [phone]   For scheduling needs, including cancellations and rescheduling, please call [place], [phone].    Follow-Up: At [facility], you and your health needs are our priority.  As part of our continuing mission to provide you with exceptional heart care, we have created designated Provider Care Teams.  These Care Teams include your primary Cardiologist (physician) and Advanced Practice Providers (APPs -  Physician Assistants and Nurse Practitioners) who all work together to provide you with the care you  need, when you need it.  We recommend signing up for the patient portal called "MyChart".  Sign up information is provided on this After Visit Summary.  MyChart is used to connect with patients for Virtual Visits (Telemedicine).  Patients are able to view lab/test results,  encounter notes, upcoming appointments, etc.  Non-urgent messages can be sent to your provider as well.   To learn more about what you can do with MyChart, go to [email or web address].    Your next appointment:    Follow up as needed  Provider:   Dr Wyline Mood

## 2023-05-14 ENCOUNTER — Ambulatory Visit
Admission: RE | Admit: 2023-05-14 | Discharge: 2023-05-14 | Disposition: A | Discharge status: Home / Self Care | Payer: Medicare PPO | Source: Ambulatory Visit | Attending: Nurse Practitioner | Admitting: Nurse Practitioner

## 2023-05-14 DIAGNOSIS — E049 Nontoxic goiter, unspecified: Secondary | ICD-10-CM | POA: Diagnosis not present

## 2023-05-14 DIAGNOSIS — E041 Nontoxic single thyroid nodule: Secondary | ICD-10-CM

## 2023-05-15 ENCOUNTER — Telehealth (HOSPITAL_COMMUNITY): Payer: Self-pay | Admitting: Emergency Medicine

## 2023-05-15 NOTE — Telephone Encounter (Signed)
Reaching out to patient to offer assistance regarding upcoming cardiac imaging study; pt verbalizes understanding of appt date/time, parking situation and where to check in, pre-test NPO status and medications ordered, and verified current allergies; name and call back number provided for further questions should they arise Jaskiran Pata RN Navigator Cardiac Imaging [facility] [phone] office [phone] cell  50mg metoprolol    

## 2023-05-18 ENCOUNTER — Ambulatory Visit (HOSPITAL_COMMUNITY)
Admission: RE | Admit: 2023-05-18 | Discharge: 2023-05-18 | Disposition: A | Discharge status: Home / Self Care | Payer: Medicare PPO | Source: Ambulatory Visit | Attending: Internal Medicine | Admitting: Internal Medicine

## 2023-05-18 DIAGNOSIS — R072 Precordial pain: Secondary | ICD-10-CM | POA: Diagnosis not present

## 2023-05-18 DIAGNOSIS — K449 Diaphragmatic hernia without obstruction or gangrene: Secondary | ICD-10-CM | POA: Insufficient documentation

## 2023-05-18 DIAGNOSIS — J9811 Atelectasis: Secondary | ICD-10-CM | POA: Diagnosis not present

## 2023-05-18 DIAGNOSIS — K760 Fatty (change of) liver, not elsewhere classified: Secondary | ICD-10-CM | POA: Diagnosis not present

## 2023-05-18 DIAGNOSIS — R079 Chest pain, unspecified: Secondary | ICD-10-CM | POA: Insufficient documentation

## 2023-05-18 MED ORDER — NITROGLYCERIN 0.4 MG SL SUBL
0.8000 mg | SUBLINGUAL_TABLET | Freq: Once | SUBLINGUAL | Status: AC
  Administered 2023-05-18: 0.8 mg via SUBLINGUAL

## 2023-05-18 MED ORDER — NITROGLYCERIN 0.4 MG SL SUBL
SUBLINGUAL_TABLET | SUBLINGUAL | Status: AC
  Filled 2023-05-18: qty 2

## 2023-05-18 MED ORDER — IOHEXOL 350 MG/ML SOLN
95.0000 mL | Freq: Once | INTRAVENOUS | Status: AC | PRN
  Administered 2023-05-18: 95 mL via INTRAVENOUS

## 2023-05-20 DIAGNOSIS — G4733 Obstructive sleep apnea (adult) (pediatric): Secondary | ICD-10-CM | POA: Diagnosis not present

## 2023-05-28 DIAGNOSIS — F419 Anxiety disorder, unspecified: Secondary | ICD-10-CM | POA: Diagnosis not present

## 2023-05-28 DIAGNOSIS — E6609 Other obesity due to excess calories: Secondary | ICD-10-CM | POA: Diagnosis not present

## 2023-05-28 DIAGNOSIS — Z6831 Body mass index (BMI) 31.0-31.9, adult: Secondary | ICD-10-CM | POA: Diagnosis not present

## 2023-05-28 DIAGNOSIS — Z13820 Encounter for screening for osteoporosis: Secondary | ICD-10-CM | POA: Diagnosis not present

## 2023-06-19 DIAGNOSIS — G4733 Obstructive sleep apnea (adult) (pediatric): Secondary | ICD-10-CM | POA: Diagnosis not present

## 2023-06-30 DIAGNOSIS — S92351A Displaced fracture of fifth metatarsal bone, right foot, initial encounter for closed fracture: Secondary | ICD-10-CM | POA: Diagnosis not present

## 2023-07-01 DIAGNOSIS — G4733 Obstructive sleep apnea (adult) (pediatric): Secondary | ICD-10-CM | POA: Diagnosis not present

## 2023-07-15 DIAGNOSIS — M3589 Other specified systemic involvement of connective tissue: Secondary | ICD-10-CM | POA: Diagnosis not present

## 2023-07-15 DIAGNOSIS — M3500 Sicca syndrome, unspecified: Secondary | ICD-10-CM | POA: Diagnosis not present

## 2023-07-15 DIAGNOSIS — M7062 Trochanteric bursitis, left hip: Secondary | ICD-10-CM | POA: Diagnosis not present

## 2023-07-15 DIAGNOSIS — M1990 Unspecified osteoarthritis, unspecified site: Secondary | ICD-10-CM | POA: Diagnosis not present

## 2023-07-15 DIAGNOSIS — M25511 Pain in right shoulder: Secondary | ICD-10-CM | POA: Diagnosis not present

## 2023-07-19 DIAGNOSIS — G4733 Obstructive sleep apnea (adult) (pediatric): Secondary | ICD-10-CM | POA: Diagnosis not present

## 2023-07-20 ENCOUNTER — Other Ambulatory Visit: Payer: Self-pay | Admitting: Obstetrics and Gynecology

## 2023-07-20 DIAGNOSIS — Z1231 Encounter for screening mammogram for malignant neoplasm of breast: Secondary | ICD-10-CM

## 2023-07-29 ENCOUNTER — Other Ambulatory Visit (HOSPITAL_COMMUNITY): Payer: Self-pay | Admitting: Family Medicine

## 2023-07-29 DIAGNOSIS — G4733 Obstructive sleep apnea (adult) (pediatric): Secondary | ICD-10-CM | POA: Diagnosis not present

## 2023-07-29 DIAGNOSIS — C8599 Non-Hodgkin lymphoma, unspecified, extranodal and solid organ sites: Secondary | ICD-10-CM | POA: Diagnosis not present

## 2023-07-29 DIAGNOSIS — E11610 Type 2 diabetes mellitus with diabetic neuropathic arthropathy: Secondary | ICD-10-CM | POA: Diagnosis not present

## 2023-07-29 DIAGNOSIS — M3507 Sjogren syndrome with central nervous system involvement: Secondary | ICD-10-CM | POA: Diagnosis not present

## 2023-07-29 DIAGNOSIS — E6609 Other obesity due to excess calories: Secondary | ICD-10-CM | POA: Diagnosis not present

## 2023-07-29 DIAGNOSIS — R251 Tremor, unspecified: Secondary | ICD-10-CM | POA: Diagnosis not present

## 2023-07-29 DIAGNOSIS — Z6832 Body mass index (BMI) 32.0-32.9, adult: Secondary | ICD-10-CM | POA: Diagnosis not present

## 2023-07-29 DIAGNOSIS — Z13820 Encounter for screening for osteoporosis: Secondary | ICD-10-CM

## 2023-08-18 ENCOUNTER — Ambulatory Visit
Admission: RE | Admit: 2023-08-18 | Discharge: 2023-08-18 | Disposition: A | Discharge status: Home / Self Care | Payer: Medicare PPO | Source: Ambulatory Visit | Attending: Obstetrics and Gynecology | Admitting: Obstetrics and Gynecology

## 2023-08-18 ENCOUNTER — Ambulatory Visit: Payer: Medicare PPO

## 2023-08-18 DIAGNOSIS — Z1231 Encounter for screening mammogram for malignant neoplasm of breast: Secondary | ICD-10-CM | POA: Diagnosis not present

## 2023-08-19 DIAGNOSIS — G4733 Obstructive sleep apnea (adult) (pediatric): Secondary | ICD-10-CM | POA: Diagnosis not present

## 2023-08-19 DIAGNOSIS — N200 Calculus of kidney: Secondary | ICD-10-CM | POA: Diagnosis not present

## 2023-08-20 ENCOUNTER — Ambulatory Visit (HOSPITAL_COMMUNITY)
Admission: RE | Admit: 2023-08-20 | Discharge: 2023-08-20 | Disposition: A | Discharge status: Home / Self Care | Payer: Medicare PPO | Source: Ambulatory Visit | Attending: Family Medicine | Admitting: Family Medicine

## 2023-08-20 DIAGNOSIS — M85851 Other specified disorders of bone density and structure, right thigh: Secondary | ICD-10-CM | POA: Diagnosis not present

## 2023-08-20 DIAGNOSIS — M8589 Other specified disorders of bone density and structure, multiple sites: Secondary | ICD-10-CM | POA: Diagnosis not present

## 2023-08-20 DIAGNOSIS — Z780 Asymptomatic menopausal state: Secondary | ICD-10-CM | POA: Diagnosis not present

## 2023-08-20 DIAGNOSIS — Z13820 Encounter for screening for osteoporosis: Secondary | ICD-10-CM | POA: Insufficient documentation

## 2023-09-04 ENCOUNTER — Other Ambulatory Visit: Payer: Self-pay | Admitting: Urology

## 2023-09-07 ENCOUNTER — Other Ambulatory Visit: Payer: Self-pay | Admitting: Urology

## 2023-09-19 DIAGNOSIS — G4733 Obstructive sleep apnea (adult) (pediatric): Secondary | ICD-10-CM | POA: Diagnosis not present

## 2023-09-29 DIAGNOSIS — S92351A Displaced fracture of fifth metatarsal bone, right foot, initial encounter for closed fracture: Secondary | ICD-10-CM | POA: Diagnosis not present

## 2023-09-30 DIAGNOSIS — G4733 Obstructive sleep apnea (adult) (pediatric): Secondary | ICD-10-CM | POA: Diagnosis not present

## 2023-10-07 DIAGNOSIS — R2689 Other abnormalities of gait and mobility: Secondary | ICD-10-CM | POA: Diagnosis not present

## 2023-10-07 DIAGNOSIS — E1142 Type 2 diabetes mellitus with diabetic polyneuropathy: Secondary | ICD-10-CM | POA: Diagnosis not present

## 2023-10-07 DIAGNOSIS — R251 Tremor, unspecified: Secondary | ICD-10-CM | POA: Diagnosis not present

## 2023-10-12 NOTE — Progress Notes (Addendum)
COVID Vaccine Completed:  Yes  Date of COVID positive in last 90 days:  PCP - Assunta Found, MD Cardiologist - Carolan Clines, MD  Chest x-ray -  EKG - 05-06-23 Epic Stress Test -  ECHO - 10-15-10 Epic Cardiac Cath -  Pacemaker/ICD device last checked: Spinal Cord Stimulator: Coronary CT - 05-18-23 Epic  Bowel Prep -   Sleep Study -  CPAP -   Fasting Blood Sugar -  Checks Blood Sugar _____ times a day  Ozempic Last dose of GLP1 agonist-  do not take after 10-21-23 GLP1 instructions: do not take 7 days prior to surgery    Jardiance  Last dose of SGLT-2 inhibitors-  do not take after 10-25-23 SGLT-2 instructions: do not take 3 days prior to surgery    Blood Thinner Instructions:  Time Aspirin Instructions: Last Dose:  Activity level:  Can go up a flight of stairs and perform activities of daily living without stopping and without symptoms of chest pain or shortness of breath.  Able to exercise without symptoms  Unable to go up a flight of stairs without symptoms of     Anesthesia review:  Recent eval for chest pain  Patient denies shortness of breath, fever, cough and chest pain at PAT appointment  Patient verbalized understanding of instructions that were given to them at the PAT appointment. Patient was also instructed that they will need to review over the PAT instructions again at home before surgery.

## 2023-10-12 NOTE — Patient Instructions (Addendum)
SURGICAL WAITING ROOM VISITATION Patients having surgery or a procedure may have no more than 2 support people in the waiting area - these visitors may rotate.    Children under the age of 67 must have an adult with them who is not the patient.  If the patient needs to stay at the hospital during part of their recovery, the visitor guidelines for inpatient rooms apply. Pre-op nurse will coordinate an appropriate time for 1 support person to accompany patient in pre-op.  This support person may not rotate.    Please refer to the [facility] website for the visitor guidelines for Inpatients (after your surgery is over and you are in a regular room).       Your procedure is scheduled on: 10-29-23   Report to [facility] Main Entrance    Report to admitting at 7:15 AM   Call this number if you have problems the morning of surgery [phone]   Do not eat food or drink liquids :After Midnight.           If you have questions, please contact your surgeon's office.   FOLLOW  ANY ADDITIONAL PRE OP INSTRUCTIONS YOU RECEIVED FROM YOUR SURGEON'S OFFICE!!!     Oral Hygiene is also important to reduce your risk of infection.                                    Remember - BRUSH YOUR TEETH THE MORNING OF SURGERY WITH YOUR REGULAR TOOTHPASTE   Do NOT smoke after Midnight   Take these medicines the morning of surgery with A SIP OF WATER:   Alprazolam  Famotidine  Gabapentin  Claritin  Metoprolol  Tramadol if needed  Okay to use eyedrops  Stop all vitamins and herbal supplements 7 days before surgery  How to Manage Your Diabetes Before and After Surgery  Why is it important to control my blood sugar before and after surgery? Improving blood sugar levels before and after surgery helps healing and can limit problems. A way of improving blood sugar control is eating a healthy diet by:  Eating less sugar and carbohydrates  Increasing activity/exercise  Talking with your doctor  about reaching your blood sugar goals High blood sugars (greater than 180 mg/dL) can raise your risk of infections and slow your recovery, so you will need to focus on controlling your diabetes during the weeks before surgery. Make sure that the doctor who takes care of your diabetes knows about your planned surgery including the date and location.  How do I manage my blood sugar before surgery? Check your blood sugar at least 4 times a day, starting 2 days before surgery, to make sure that the level is not too high or low. Check your blood sugar the morning of your surgery when you wake up and every 2 hours until you get to the Short Stay unit. If your blood sugar is less than 70 mg/dL, you will need to treat for low blood sugar: Do not take insulin. Treat a low blood sugar (less than 70 mg/dL) with  cup of clear juice (cranberry or apple), 4 glucose tablets, OR glucose gel. Recheck blood sugar in 15 minutes after treatment (to make sure it is greater than 70 mg/dL). If your blood sugar is not greater than 70 mg/dL on recheck, call [phone] for further instructions. Report your blood sugar to the short stay nurse when  you get to Short Stay.  If you are admitted to the hospital after surgery: Your blood sugar will be checked by the staff and you will probably be given insulin after surgery (instead of oral diabetes medicines) to make sure you have good blood sugar levels. The goal for blood sugar control after surgery is 80-180 mg/dL.   WHAT DO I DO ABOUT MY DIABETES MEDICATION?  Do not take oral diabetes medicines (pills) the morning of surgery.  Hold Ozempic 7 days before surgery (do not take after 10-21-23).  DO NOT TAKE THE FOLLOWING 7 DAYS PRIOR TO SURGERY: Ozempic, Wegovy, Rybelsus (Semaglutide), Byetta (exenatide), Bydureon (exenatide ER), Victoza, Saxenda (liraglutide), or Trulicity (dulaglutide) Mounjaro (Tirzepatide) Adlyxin (Lixisenatide), Polyethylene Glycol  Loxenatide.   Reviewed and Endorsed by [facility] Patient Education Committee, August 2015  Bring CPAP mask and tubing day of surgery.                              You may not have any metal on your body including hair pins, jewelry, and body piercing             Do not wear make-up, lotions, powders, perfumes or deodorant  Do not wear nail polish including gel and S&S, artificial/acrylic nails, or any other type of covering on natural nails including finger and toenails. If you have artificial nails, gel coating, etc. that needs to be removed by a nail salon please have this removed prior to surgery or surgery may need to be canceled/ delayed if the surgeon/ anesthesia feels like they are unable to be safely monitored.   Do not shave  48 hours prior to surgery.    Do not bring valuables to the hospital. [facility] IS NOT RESPONSIBLE   FOR VALUABLES.   Contacts, dentures or bridgework may not be worn into surgery.  DO NOT BRING YOUR HOME MEDICATIONS TO THE HOSPITAL. PHARMACY WILL DISPENSE MEDICATIONS LISTED ON YOUR MEDICATION LIST TO YOU DURING YOUR ADMISSION IN THE HOSPITAL!    Patients discharged on the day of surgery will not be allowed to drive home.  Someone NEEDS to stay with you for the first 24 hours after anesthesia.   Special Instructions: Bring a copy of your healthcare power of attorney and living will documents the day of surgery if you haven't scanned them before.              Please read over the following fact sheets you were given: IF YOU HAVE QUESTIONS ABOUT YOUR PRE-OP INSTRUCTIONS PLEASE CALL [phone] Gwen  If you received a COVID test during your pre-op visit  it is requested that you wear a mask when out in public, stay away from anyone that may not be feeling well and notify your surgeon if you develop symptoms. If you test positive for Covid or have been in contact with anyone that has tested positive in the last 10 days please notify you surgeon.  [facility] - Preparing for Surgery Before surgery, you can play an important role.  Because skin is not sterile, your skin needs to be as free of germs as possible.  You can reduce the number of germs on your skin by washing with CHG (chlorahexidine gluconate) soap before surgery.  CHG is an antiseptic cleaner which kills germs and bonds with the skin to continue killing germs even after washing. Please DO NOT use if you have an allergy to CHG or  antibacterial soaps.  If your skin becomes reddened/irritated stop using the CHG and inform your nurse when you arrive at Short Stay. Do not shave (including legs and underarms) for at least 48 hours prior to the first CHG shower.  You may shave your face/neck.  Please follow these instructions carefully:  1.  Shower with an antibacterial Soap the night before surgery and the  morning of surgery.  2.  If you choose to wash your hair, wash your hair first as usual with your normal  shampoo.  3.  After you shampoo, rinse your hair and body thoroughly to remove the shampoo.                             4.  Pat yourself dry with a clean towel.             5.  Wear clean pajamas.             6.  Place clean sheets on your bed the night of your first shower and do not  sleep with pets. Day of Surgery : Do not apply any lotions/deodorants the morning of surgery.  Please wear clean clothes to the hospital/surgery center.  FAILURE TO FOLLOW THESE INSTRUCTIONS MAY RESULT IN THE CANCELLATION OF YOUR SURGERY  PATIENT SIGNATURE_________________________________  NURSE SIGNATURE__________________________________  ________________________________________________________________________

## 2023-10-14 ENCOUNTER — Encounter (HOSPITAL_COMMUNITY)
Admission: RE | Admit: 2023-10-14 | Discharge: 2023-10-14 | Disposition: A | Discharge status: Home / Self Care | Payer: Medicare PPO | Source: Ambulatory Visit | Attending: Anesthesiology | Admitting: Anesthesiology

## 2023-10-14 ENCOUNTER — Encounter (HOSPITAL_COMMUNITY): Payer: Self-pay

## 2023-10-14 DIAGNOSIS — N200 Calculus of kidney: Secondary | ICD-10-CM | POA: Diagnosis not present

## 2023-10-14 DIAGNOSIS — E119 Type 2 diabetes mellitus without complications: Secondary | ICD-10-CM

## 2023-10-19 DIAGNOSIS — G4733 Obstructive sleep apnea (adult) (pediatric): Secondary | ICD-10-CM | POA: Diagnosis not present

## 2023-10-21 NOTE — Patient Instructions (Signed)
SURGICAL WAITING ROOM VISITATION  Patients having surgery or a procedure may have no more than 2 support people in the waiting area - these visitors may rotate.    Children under the age of 13 must have an adult with them who is not the patient.  Due to an increase in RSV and influenza rates and associated hospitalizations, children ages 85 and under may not visit patients in [facility] hospitals.  If the patient needs to stay at the hospital during part of their recovery, the visitor guidelines for inpatient rooms apply. Pre-op nurse will coordinate an appropriate time for 1 support person to accompany patient in pre-op.  This support person may not rotate.    Please refer to the [facility] website for the visitor guidelines for Inpatients (after your surgery is over and you are in a regular room).    Your procedure is scheduled on: 10/29/23    Report to [facility] Main Entrance    Report to admitting at 7:15 AM   Call this number if you have problems the morning of surgery [phone]   Do not eat food or drink liquids :After Midnight.          If you have questions, please contact your surgeon's office.   FOLLOW BOWEL PREP AND ANY ADDITIONAL PRE OP INSTRUCTIONS YOU RECEIVED FROM YOUR SURGEON'S OFFICE!!!     Oral Hygiene is also important to reduce your risk of infection.                                    Remember - BRUSH YOUR TEETH THE MORNING OF SURGERY WITH YOUR REGULAR TOOTHPASTE  DENTURES WILL BE REMOVED PRIOR TO SURGERY PLEASE DO NOT APPLY "Poly grip" OR ADHESIVES!!!   Stop all vitamins and herbal supplements 7 days before surgery.   Take these medicines the morning of surgery with A SIP OF WATER: Alprazolam, Famotidine, Gabapentin, Claritin  DO NOT TAKE ANY ORAL DIABETIC MEDICATIONS DAY OF YOUR SURGERY  How to Manage Your Diabetes Before and After Surgery  Why is it important to control my blood sugar before and after surgery? Improving blood sugar  levels before and after surgery helps healing and can limit problems. A way of improving blood sugar control is eating a healthy diet by:  Eating less sugar and carbohydrates  Increasing activity/exercise  Talking with your doctor about reaching your blood sugar goals High blood sugars (greater than 180 mg/dL) can raise your risk of infections and slow your recovery, so you will need to focus on controlling your diabetes during the weeks before surgery. Make sure that the doctor who takes care of your diabetes knows about your planned surgery including the date and location.  How do I manage my blood sugar before surgery? Check your blood sugar at least 4 times a day, starting 2 days before surgery, to make sure that the level is not too high or low. Check your blood sugar the morning of your surgery when you wake up and every 2 hours until you get to the Short Stay unit. If your blood sugar is less than 70 mg/dL, you will need to treat for low blood sugar: Do not take insulin. Treat a low blood sugar (less than 70 mg/dL) with  cup of clear juice (cranberry or apple), 4 glucose tablets, OR glucose gel. Recheck blood sugar in 15 minutes after treatment (to make sure it  is greater than 70 mg/dL). If your blood sugar is not greater than 70 mg/dL on recheck, call [phone] for further instructions. Report your blood sugar to the short stay nurse when you get to Short Stay.  If you are admitted to the hospital after surgery: Your blood sugar will be checked by the staff and you will probably be given insulin after surgery (instead of oral diabetes medicines) to make sure you have good blood sugar levels. The goal for blood sugar control after surgery is 80-180 mg/dL.   WHAT DO I DO ABOUT MY DIABETES MEDICATION?  Do not take oral diabetes medicines (pills) the morning of surgery.  Hold Ozempic 10/24/23.  DO NOT TAKE THE FOLLOWING 7 DAYS PRIOR TO SURGERY: Ozempic, Wegovy, Rybelsus  (Semaglutide), Byetta (exenatide), Bydureon (exenatide ER), Victoza, Saxenda (liraglutide), or Trulicity (dulaglutide) Mounjaro (Tirzepatide) Adlyxin (Lixisenatide), Polyethylene Glycol Loxenatide.  Reviewed and Endorsed by [facility] Patient Education Committee, August 2015  Bring CPAP mask and tubing day of surgery.                              You may not have any metal on your body including hair pins, jewelry, and body piercing             Do not wear make-up, lotions, powders, perfumes, or deodorant  Do not wear nail polish including gel and S&S, artificial/acrylic nails, or any other type of covering on natural nails including finger and toenails. If you have artificial nails, gel coating, etc. that needs to be removed by a nail salon please have this removed prior to surgery or surgery may need to be canceled/ delayed if the surgeon/ anesthesia feels like they are unable to be safely monitored.   Do not shave  48 hours prior to surgery.    Do not bring valuables to the hospital. [facility] IS NOT             RESPONSIBLE   FOR VALUABLES.   Contacts, glasses, dentures or bridgework may not be worn into surgery.  DO NOT BRING YOUR HOME MEDICATIONS TO THE HOSPITAL. PHARMACY WILL DISPENSE MEDICATIONS LISTED ON YOUR MEDICATION LIST TO YOU DURING YOUR ADMISSION IN THE HOSPITAL!    Patients discharged on the day of surgery will not be allowed to drive home.  Someone NEEDS to stay with you for the first 24 hours after anesthesia.   Special Instructions: Bring a copy of your healthcare power of attorney and living will documents the day of surgery if you haven't scanned them before.              Please read over the following fact sheets you were given: IF YOU HAVE QUESTIONS ABOUT YOUR PRE-OP INSTRUCTIONS PLEASE CALL [name]    If you received a COVID test during your pre-op visit  it is requested that you wear a mask when out in public, stay away from anyone that may not be  feeling well and notify your surgeon if you develop symptoms. If you test positive for Covid or have been in contact with anyone that has tested positive in the last 10 days please notify you surgeon.    [facility] - Preparing for Surgery Before surgery, you can play an important role.  Because skin is not sterile, your skin needs to be as free of germs as possible.  You can reduce the number of germs on your skin by washing  with CHG (chlorahexidine gluconate) soap before surgery.  CHG is an antiseptic cleaner which kills germs and bonds with the skin to continue killing germs even after washing. Please DO NOT use if you have an allergy to CHG or antibacterial soaps.  If your skin becomes reddened/irritated stop using the CHG and inform your nurse when you arrive at Short Stay. Do not shave (including legs and underarms) for at least 48 hours prior to the first CHG shower.  You may shave your face/neck.  Please follow these instructions carefully:  1.  Shower with CHG Soap the night before surgery and the  morning of surgery.  2.  If you choose to wash your hair, wash your hair first as usual with your normal  shampoo.  3.  After you shampoo, rinse your hair and body thoroughly to remove the shampoo.                             4.  Use CHG as you would any other liquid soap.  You can apply chg directly to the skin and wash.  Gently with a scrungie or clean washcloth.  5.  Apply the CHG Soap to your body ONLY FROM THE NECK DOWN.   Do   not use on face/ open                           Wound or open sores. Avoid contact with eyes, ears mouth and   genitals (private parts).                       Wash face,  Genitals (private parts) with your normal soap.             6.  Wash thoroughly, paying special attention to the area where your    surgery  will be performed.  7.  Thoroughly rinse your body with warm water from the neck down.  8.  DO NOT shower/wash with your normal soap after using and rinsing off  the CHG Soap.                9.  Pat yourself dry with a clean towel.            10.  Wear clean pajamas.            11.  Place clean sheets on your bed the night of your first shower and do not  sleep with pets. Day of Surgery : Do not apply any lotions/deodorants the morning of surgery.  Please wear clean clothes to the hospital/surgery center.  FAILURE TO FOLLOW THESE INSTRUCTIONS MAY RESULT IN THE CANCELLATION OF YOUR SURGERY  PATIENT SIGNATURE_________________________________  NURSE SIGNATURE__________________________________  ________________________________________________________________________

## 2023-10-21 NOTE — Progress Notes (Signed)
COVID Vaccine Completed: yes  Date of COVID positive in last 90 days: no  PCP - Assunta Found, MD Cardiologist - Carolan Clines, MD  Chest x-ray - n/a EKG - 05/06/23 Epic Stress Test - n/a ECHO - 10/15/10 Epic Cardiac Cath - n/a Pacemaker/ICD device last checked: n/a Spinal Cord Stimulator: n/a Coronary CT- 05/18/23 Epic  Bowel Prep - no  Sleep Study - yes CPAP -  yes every night   Fasting Blood Sugar - 127-150 Checks Blood Sugar  once a week  Last dose of GLP1 agonist- Ozempic, takes Saturdays GLP1 instructions: hold 10/24/23   Last dose of SGLT-2 inhibitors-  N/A SGLT-2 instructions: N/A   Blood Thinner Instructions:  n/a Aspirin Instructions: Last Dose:  Activity level: Can perform activities of daily living without stopping and without symptoms of chest pain or shortness of breath. No stairs due to balance issues. Needs handrails for stairs  Anesthesia review: recent chest pain, DM2,   Patient denies shortness of breath, fever, cough and chest pain at PAT appointment  Patient verbalized understanding of instructions that were given to them at the PAT appointment. Patient was also instructed that they will need to review over the PAT instructions again at home before surgery.

## 2023-10-22 ENCOUNTER — Encounter (HOSPITAL_COMMUNITY)
Admission: RE | Admit: 2023-10-22 | Discharge: 2023-10-22 | Disposition: A | Discharge status: Home / Self Care | Payer: Medicare PPO | Source: Ambulatory Visit | Attending: Urology | Admitting: Urology

## 2023-10-22 ENCOUNTER — Encounter (HOSPITAL_COMMUNITY): Payer: Self-pay

## 2023-10-22 ENCOUNTER — Other Ambulatory Visit: Payer: Self-pay

## 2023-10-22 DIAGNOSIS — E119 Type 2 diabetes mellitus without complications: Secondary | ICD-10-CM | POA: Diagnosis not present

## 2023-10-22 DIAGNOSIS — Z01812 Encounter for preprocedural laboratory examination: Secondary | ICD-10-CM | POA: Diagnosis not present

## 2023-10-22 DIAGNOSIS — Z01818 Encounter for other preprocedural examination: Secondary | ICD-10-CM | POA: Diagnosis present

## 2023-10-22 HISTORY — DX: Sjogren syndrome, unspecified: M35.00

## 2023-10-22 LAB — BASIC METABOLIC PANEL
Anion gap: 12 (ref 5–15)
BUN: 20 mg/dL (ref 8–23)
CO2: 26 mmol/L (ref 22–32)
Calcium: 9.8 mg/dL (ref 8.9–10.3)
Chloride: 103 mmol/L (ref 98–111)
Creatinine, Ser: 0.98 mg/dL (ref 0.44–1.00)
GFR, Estimated: 60 mL/min — ABNORMAL LOW (ref >60)
Glucose, Bld: 112 mg/dL — ABNORMAL HIGH (ref 70–99)
Potassium: 3.2 mmol/L — ABNORMAL LOW (ref 3.5–5.1)
Sodium: 141 mmol/L (ref 135–145)

## 2023-10-22 LAB — CBC
HCT: 44.8 % (ref 36.0–46.0)
Hemoglobin: 14.6 g/dL (ref 12.0–15.0)
MCH: 29.9 pg (ref 26.0–34.0)
MCHC: 32.6 g/dL (ref 30.0–36.0)
MCV: 91.8 fL (ref 80.0–100.0)
Platelets: 274 10*3/uL (ref 150–400)
RBC: 4.88 MIL/uL (ref 3.87–5.11)
RDW: 13.1 % (ref 11.5–15.5)
WBC: 11.1 10*3/uL — ABNORMAL HIGH (ref 4.0–10.5)
nRBC: 0 % (ref 0.0–0.2)

## 2023-10-22 LAB — GLUCOSE, CAPILLARY: Glucose-Capillary: 163 mg/dL — ABNORMAL HIGH (ref 70–99)

## 2023-10-28 NOTE — H&P (Signed)
Office Visit Report     10/14/2023   --------------------------------------------------------------------------------   Victoria Andrade  MRN: [id number]  DOB: December 01, 1946, 77 year old Female  SSN: -[id number] [id number]   PRIMARY CARE:  Assunta Found, MD  PRIMARY CARE FAX:  [phone]  REFERRING:  Azucena Kuba  PROVIDER:  Heloise Purpura, M.D.  TREATING:  Bartholomew Crews, NP  LOCATION:  [facility] [phone]     --------------------------------------------------------------------------------   CC/HPI: Bilateral renal calculi   Victoria Andrade returns today for further evaluation of her bilateral renal calculi. She has been unable to tolerate medical therapy for her recurrent calcium oxalate urolithiasis. She was last seen in the wintertime when she was noted to have some increasing stone burden in both her left and right kidney. She did subsequently pass a stone in May that measured approximately 4 to 5 mm. She has more recently been asymptomatic and follows up today with a KUB x-ray and renal ultrasound. We had discussed the option of elective ureteroscopic treatment which she deferred until her evaluation today.   10/14/2023: Victoria Andrade is a 77 year old female who presents for preoperative appointment prior to undergoing ureteroscopy. She is doing well and denies bothersome complaints today. She denies fevers and chills. She has had no changes to her health history over the last month.     ALLERGIES: Adhesive tape Ciprofloxacin HCl TABS - Skin Rash Keflex TABS - Skin Rash Latex - Trouble Breathing, Swelling Penicillins - Skin Rash PredniSONE TABS Sulfa Drugs - Skin Rash Tamsulosin HCl CAPS - Other Reaction, hair loss    MEDICATIONS: Amerge 2.5 mg tablet Oral  Benadryl TABS Oral  Claritin TABS Oral  Duloxetine Hcl  Ezetimibe 10 mg tablet  Hydrochlorothiazide 25 mg tablet 1 Oral  Linzess 145 mcg capsule 1 capsule PO Daily  Miralax  Montelukast Sodium 10 mg tablet Oral   Ozempic  Stool Softener  Vitamin D-3 5000 UNIT TABS Oral  Xanax 1 mg tablet Oral     GU PSH: Cysto Remove Stent FB Sim - 2021 ESWL - 2017, 2016, 2013, 2013 Hysterectomy Unilat SO - 2013 Percut Stone Removal >2cm, Right - 2021 Ureteroscopic laser litho, Right - 2023, Left - 2022, Left - 2021, Left - 2021     NON-GU PSH: Cataract surgery, Bilateral Cataract Surgery.., Bilateral Cholecystectomy (laparoscopic) Remove Tonsils - 2008 Shoulder Joint Surgery, Left - 2018 Tubal Ligation - 2008     GU PMH: Renal calculus - 08/19/2023, - 01/15/2023, - 05/06/2022 (Stable), - 2023, - 2022, - 2022, - 2022, - 2021, - 2021, - 2021, Nephrolithiasis, - 2017 Ureteral calculus - 2021, - 2021 (Stable), - 2021, Calculus of ureter, - 2014 Flank Pain (Worsening, Chronic), Right, Culture urine. No ABX unless culture proven UTI. Reassured no acute finding today today explain flank pain. Recommend she alternate Tylenol/NSAID and heat/ice for pain. If pain persist or worsens may need CT urogram. - 2019 Unspecified condition associated with female genital organs and menstrual cycle, Adnexal mass - 2016 Other microscopic hematuria, Microscopic hematuria - 2015      PMH Notes:   1) Urolithiasis: She has a history of calcium oxalate urolithiasis. She has a history of low urine volume and hyperoxaluria.   Current treatment: Fluid hydration, calcium citrate, low oxalate diet, high citrate diet  Prior treatment: HCTZ (unable to tolerate)   1995: ESWL  Apr 2013: ESWL 8 mm left ureteral stone  June 2013: ESWL of multiple left renal calculi  Aug 2013: 24 hr urine -  low urine volume, hyperoxaluria  Feb 2014: 24 hr urine - Stone risk significantly reduced  Apr 2015: 24 hr urine - Calcium and oxalate increased  Dec 2015: 24 hr urine - Hypercalciuria and high urine sodium - increased HCTZ to 25 mg q am and 12.5 mg q pm, reduce dietary sodium  Jul 2016: 24 hr urine - Hyeroxaluria and hypocitraturia - not compliant  with calcium treatment -- plan to increase calcium supplement compliance  Dec 2016: ESWL of right renal calculus  Mar 2017: ESWL of left renal calculus  Mar 2021: R PCNL  May 2021: 24 hr urine - Low urine volume, hyperoxaluria, hypocitraturia  Nov 2021: Left ureteroscopic laser lithotripsy (calcium oxalate monohydrate mostly)  Dec 2021: Left ureteroscopic laser lithotripsy (migration of renal calculi), discussed PCNL but patient elected staged ureteroscopic treatment  Jan 2022: 2nd stage left ureteroscopic laser lithotripsy  Mar 2023: Right ureteroscopic laser lithotripsy (calcium oxalate monohydrate)  May 2024: Spontaneously passed a left ureteral stone     NON-GU PMH: Anxiety, Anxiety - 2014 Irritable bowel syndrome with diarrhea, Irritable Bowel Syndrome - 2014 Diabetes Type 2 GERD Hypertension    FAMILY HISTORY: Acute Myocardial Infarction - Mother, Runs In Family Chronic Obstructive Pulmonary Disease - Mother Diabetes - Runs In Family Ischemic Stroke - Mother   SOCIAL HISTORY: Marital Status: Married Preferred Language: English; Ethnicity: Not Hispanic Or Latino; Race: White Current Smoking Status: Patient has never smoked.  Does not use smokeless tobacco. Types of alcohol consumed: Wine. Social Drinker.  Does not use drugs. Does not drink caffeine. Has not had a blood transfusion.    REVIEW OF SYSTEMS:    GU Review Female:   Patient denies frequent urination, hard to postpone urination, burning /pain with urination, get up at night to urinate, leakage of urine, stream starts and stops, trouble starting your stream, have to strain to urinate, and being pregnant.  Gastrointestinal (Upper):   Patient denies nausea, vomiting, and indigestion/ heartburn.  Gastrointestinal (Lower):   Patient denies diarrhea and constipation.  Constitutional:   Patient denies fever, night sweats, weight loss, and fatigue.  Skin:   Patient denies skin rash/ lesion and itching.  Eyes:   Patient  denies blurred vision and double vision.  Respiratory:   Patient denies cough and shortness of breath.  Musculoskeletal:   Patient denies back pain and joint pain.  Neurological:   Patient denies headaches and dizziness.  Psychologic:   Patient denies depression and anxiety.   Notes: Hematuria     VITAL SIGNS:      10/14/2023 11:35 AM  Weight 176 lb / 79.83 kg  Height 61 in / 154.94 cm  BP 125/73 mmHg  Pulse 46 /min  Temperature 91.4 F / 33 C  BMI 33.3 kg/m   MULTI-SYSTEM PHYSICAL EXAMINATION:    Constitutional: Well-nourished. No physical deformities. Normally developed. Good grooming.  Respiratory: Normal breath sounds. No labored breathing, no use of accessory muscles.   Cardiovascular: Regular rate and rhythm. No murmur, no gallop. Normal temperature, normal extremity pulses, no swelling, no varicosities.   Skin: No paleness, no jaundice, no cyanosis. No lesion, no ulcer, no rash.  Neurologic / Psychiatric: Oriented to time, oriented to place, oriented to person. No depression, no anxiety, no agitation.  Gastrointestinal: No mass, no tenderness, no rigidity, non obese abdomen.  Eyes: Normal conjunctivae. Normal eyelids.  Ears, Nose, Mouth, and Throat: Left ear no scars, no lesions, no masses. Right ear no scars, no lesions, no masses. Nose no scars,  no lesions, no masses. Normal hearing. Normal lips.  Musculoskeletal: Normal gait and station of head and neck.     Complexity of Data:  Source Of History:  Patient  Records Review:   Previous Doctor Records, Previous Patient Records  Urine Test Review:   Urinalysis   10/14/23  Urinalysis  Urine Appearance Slightly Cloudy   Urine Color Yellow   Urine Glucose Neg mg/dL  Urine Bilirubin Neg mg/dL  Urine Ketones Neg mg/dL  Urine Specific Gravity 1.015   Urine Blood Trace ery/uL  Urine pH 6.5   Urine Protein 2+ mg/dL  Urine Urobilinogen 0.2 mg/dL  Urine Nitrites Neg   Urine Leukocyte Esterase 2+ leu/uL  Urine WBC/hpf 20 -  40/hpf   Urine RBC/hpf 3 - 10/hpf   Urine Epithelial Cells 6 - 10/hpf   Urine Bacteria Many (>50/hpf)   Urine Mucous Present   Urine Yeast NS (Not Seen)   Urine Trichomonas Not Present   Urine Cystals Ca Oxalate   Urine Casts Hyaline   Urine Sperm Not Present    PROCEDURES:          Visit Complexity - G2211          Urinalysis w/Scope Dipstick Dipstick Cont'd Micro  Color: Yellow Bilirubin: Neg mg/dL WBC/hpf: 20 - 32/GMW  Appearance: Slightly Cloudy Ketones: Neg mg/dL RBC/hpf: 3 - 10/UVO  Specific Gravity: 1.015 Blood: Trace ery/uL Bacteria: Many (>50/hpf)  pH: 6.5 Protein: 2+ mg/dL Cystals: Ca Oxalate  Glucose: Neg mg/dL Urobilinogen: 0.2 mg/dL Casts: Hyaline    Nitrites: Neg Trichomonas: Not Present    Leukocyte Esterase: 2+ leu/uL Mucous: Present      Epithelial Cells: 6 - 10/hpf      Yeast: NS (Not Seen)      Sperm: Not Present    ASSESSMENT:      ICD-10 Details  1 GU:   Renal calculus - N20.0 Chronic, Stable   PLAN:           Orders Labs CULTURE, URINE          Document Letter(s):  Created for Patient: Clinical Summary         Notes:   She is scheduled to undergo left ureteroscopy with Dr. Laverle Patter on 10/29/2023. She understands if she changes her mind or has any changes to her health history including fevers, chest pain, shortness of breath she should notify the office. All of her questions were answered to the best of my ability. Her urine will be sent for precautionary culture today and I will follow-up regarding those results.        Next Appointment:      Next Appointment: 10/29/2023 09:30 AM    Appointment Type: Surgery     Location: [facility] [phone]    Provider: Heloise Purpura, M.D.    Reason for Visit: WL/OP CSYTO, (L) URS, HLL, (L) STENT      * Signed by Bartholomew Crews, NP on 10/14/23 at 1:35 PM (EDT)*

## 2023-10-29 ENCOUNTER — Ambulatory Visit (HOSPITAL_COMMUNITY): Payer: Medicare PPO | Admitting: Physician Assistant

## 2023-10-29 ENCOUNTER — Encounter (HOSPITAL_COMMUNITY)
Admission: RE | Disposition: A | Discharge status: Home / Self Care | Payer: Self-pay | Source: Ambulatory Visit | Attending: Urology

## 2023-10-29 ENCOUNTER — Encounter (HOSPITAL_COMMUNITY): Payer: Self-pay | Admitting: Urology

## 2023-10-29 ENCOUNTER — Ambulatory Visit (HOSPITAL_COMMUNITY)
Admission: RE | Admit: 2023-10-29 | Discharge: 2023-10-29 | Disposition: A | Discharge status: Home / Self Care | Payer: Medicare PPO | Source: Ambulatory Visit | Attending: Urology | Admitting: Urology

## 2023-10-29 ENCOUNTER — Ambulatory Visit (HOSPITAL_COMMUNITY): Payer: Medicare PPO

## 2023-10-29 ENCOUNTER — Ambulatory Visit (HOSPITAL_BASED_OUTPATIENT_CLINIC_OR_DEPARTMENT_OTHER): Payer: Medicare PPO | Admitting: Anesthesiology

## 2023-10-29 DIAGNOSIS — I10 Essential (primary) hypertension: Secondary | ICD-10-CM | POA: Diagnosis not present

## 2023-10-29 DIAGNOSIS — Z7985 Long-term (current) use of injectable non-insulin antidiabetic drugs: Secondary | ICD-10-CM | POA: Insufficient documentation

## 2023-10-29 DIAGNOSIS — E119 Type 2 diabetes mellitus without complications: Secondary | ICD-10-CM | POA: Insufficient documentation

## 2023-10-29 DIAGNOSIS — N200 Calculus of kidney: Secondary | ICD-10-CM | POA: Diagnosis not present

## 2023-10-29 DIAGNOSIS — K219 Gastro-esophageal reflux disease without esophagitis: Secondary | ICD-10-CM | POA: Insufficient documentation

## 2023-10-29 HISTORY — PX: CYSTOSCOPY/URETEROSCOPY/HOLMIUM LASER/STENT PLACEMENT: SHX6546

## 2023-10-29 LAB — GLUCOSE, CAPILLARY
Glucose-Capillary: 135 mg/dL — ABNORMAL HIGH (ref 70–99)
Glucose-Capillary: 119 mg/dL — ABNORMAL HIGH (ref 70–99)

## 2023-10-29 SURGERY — CYSTOSCOPY/URETEROSCOPY/HOLMIUM LASER/STENT PLACEMENT
Anesthesia: General | Site: Ureter | Laterality: Bilateral

## 2023-10-29 MED ORDER — GENTAMICIN SULFATE 40 MG/ML IJ SOLN
5.0000 mg/kg | Freq: Once | INTRAVENOUS | Status: AC
  Administered 2023-10-29: 300 mg via INTRAVENOUS
  Filled 2023-10-29: qty 7.5

## 2023-10-29 MED ORDER — AMISULPRIDE (ANTIEMETIC) 5 MG/2ML IV SOLN: 10.0000 mg | Freq: Once | INTRAVENOUS | Status: DC | PRN

## 2023-10-29 MED ORDER — ACETAMINOPHEN 10 MG/ML IV SOLN: 1000.0000 mg | Freq: Once | INTRAVENOUS | Status: DC | PRN

## 2023-10-29 MED ORDER — FENTANYL CITRATE PF 50 MCG/ML IJ SOSY: 25.0000 ug | PREFILLED_SYRINGE | INTRAMUSCULAR | Status: DC | PRN

## 2023-10-29 MED ORDER — FENTANYL CITRATE (PF) 100 MCG/2ML IJ SOLN
INTRAMUSCULAR | Status: DC | PRN
  Administered 2023-10-29: 100 ug via INTRAVENOUS

## 2023-10-29 MED ORDER — ONDANSETRON HCL 4 MG/2ML IJ SOLN: 4.0000 mg | Freq: Once | INTRAMUSCULAR | Status: DC | PRN

## 2023-10-29 MED ORDER — IOHEXOL 300 MG/ML  SOLN
INTRAMUSCULAR | Status: DC | PRN
  Administered 2023-10-29: 20 mL

## 2023-10-29 MED ORDER — CHLORHEXIDINE GLUCONATE 0.12 % MT SOLN: 15.0000 mL | Freq: Once | OROMUCOSAL | Status: DC

## 2023-10-29 MED ORDER — ORAL CARE MOUTH RINSE: 15.0000 mL | Freq: Once | OROMUCOSAL | Status: DC

## 2023-10-29 MED ORDER — INSULIN ASPART 100 UNIT/ML IJ SOLN
0.0000 [IU] | INTRAMUSCULAR | Status: DC | PRN
Start: 2023-10-29 — End: 2023-10-29

## 2023-10-29 MED ORDER — FENTANYL CITRATE (PF) 100 MCG/2ML IJ SOLN
INTRAMUSCULAR | Status: AC
  Filled 2023-10-29: qty 2

## 2023-10-29 MED ORDER — DOXYCYCLINE HYCLATE 100 MG PO TABS: 100.0000 mg | ORAL_TABLET | Freq: Two times a day (BID) | ORAL | 0 refills | Status: DC

## 2023-10-29 MED ORDER — PROPOFOL 10 MG/ML IV BOLUS
INTRAVENOUS | Status: AC
  Filled 2023-10-29: qty 20

## 2023-10-29 MED ORDER — LIDOCAINE 2% (20 MG/ML) 5 ML SYRINGE
INTRAMUSCULAR | Status: DC | PRN
  Administered 2023-10-29: 60 mg via INTRAVENOUS

## 2023-10-29 MED ORDER — ONDANSETRON HCL 4 MG/2ML IJ SOLN
INTRAMUSCULAR | Status: DC | PRN
  Administered 2023-10-29: 4 mg via INTRAVENOUS

## 2023-10-29 MED ORDER — PROPOFOL 10 MG/ML IV BOLUS
INTRAVENOUS | Status: DC | PRN
  Administered 2023-10-29: 200 mg via INTRAVENOUS

## 2023-10-29 MED ORDER — SODIUM CHLORIDE 0.9 % IR SOLN
Status: DC | PRN
  Administered 2023-10-29: 3000 mL

## 2023-10-29 MED ORDER — DEXAMETHASONE SODIUM PHOSPHATE 10 MG/ML IJ SOLN
INTRAMUSCULAR | Status: DC | PRN
  Administered 2023-10-29: 10 mg via INTRAVENOUS

## 2023-10-29 MED ORDER — LACTATED RINGERS IV SOLN: INTRAVENOUS | Status: DC

## 2023-10-29 SURGICAL SUPPLY — 25 items
BAG COUNTER SPONGE SURGICOUNT (BAG) IMPLANT
BAG SPNG CNTER NS LX DISP (BAG)
BAG URO CATCHER STRL LF (MISCELLANEOUS) ×2 IMPLANT
BASKET ZERO TIP NITINOL 2.4FR (BASKET) IMPLANT
BSKT STON RTRVL ZERO TP 2.4FR (BASKET) ×1
CATH URETL OPEN END 6FR 70 (CATHETERS) IMPLANT
CLOTH BEACON ORANGE TIMEOUT ST (SAFETY) ×2 IMPLANT
GLOVE SURG LX STRL 7.5 STRW (GLOVE) ×2 IMPLANT
GOWN STRL REUS W/ TWL XL LVL3 (GOWN DISPOSABLE) ×2 IMPLANT
GOWN STRL REUS W/TWL XL LVL3 (GOWN DISPOSABLE) ×1
GUIDEWIRE STR DUAL SENSOR (WIRE) ×2 IMPLANT
GUIDEWIRE ZIPWRE .038 STRAIGHT (WIRE) IMPLANT
IV NS 1000ML (IV SOLUTION) ×1
IV NS 1000ML BAXH (IV SOLUTION) ×2 IMPLANT
KIT TURNOVER KIT A (KITS) IMPLANT
LASER FIB FLEXIVA PULSE ID 365 (Laser) IMPLANT
MANIFOLD NEPTUNE II (INSTRUMENTS) ×2 IMPLANT
PACK CYSTO (CUSTOM PROCEDURE TRAY) ×2 IMPLANT
PAD PREP 24X48 CUFFED NSTRL (MISCELLANEOUS) ×2 IMPLANT
SHEATH NAVIGATOR HD 12/14X36 (SHEATH) IMPLANT
STENT URET 6FRX24 CONTOUR (STENTS) IMPLANT
TRACTIP FLEXIVA PULS ID 200XHI (Laser) IMPLANT
TRACTIP FLEXIVA PULSE ID 200 (Laser) ×1
TUBING CONNECTING 10 (TUBING) ×2 IMPLANT
TUBING UROLOGY SET (TUBING) ×2 IMPLANT

## 2023-10-29 NOTE — Plan of Care (Signed)
 [facility] Tonsillectomy/Adenoidectomy, Postoperative PEDS care plan entered in error.

## 2023-10-29 NOTE — Discharge Instructions (Addendum)

## 2023-10-29 NOTE — Anesthesia Preprocedure Evaluation (Addendum)
Anesthesia Evaluation  Patient identified by MRN, date of birth, ID band Patient awake    Reviewed: Allergy & Precautions, NPO status , Patient's Chart, lab work & pertinent test results  Airway Mallampati: III  TM Distance: >3 FB Neck ROM: Full    Dental no notable dental hx.    Pulmonary neg pulmonary ROS   Pulmonary exam normal        Cardiovascular hypertension, Pt. on medications Normal cardiovascular exam     Neuro/Psych  Headaches  Anxiety      Neuromuscular disease    GI/Hepatic Neg liver ROS, hiatal hernia,GERD  Medicated and Controlled,,  Endo/Other  diabetes  Patient on GLP-1 Agonist  Renal/GU Renal disease     Musculoskeletal  (+) Arthritis ,  Fibromyalgia -  Abdominal   Peds  Hematology negative hematology ROS (+)   Anesthesia Other Findings LEFT RENAL CALCULI  Reproductive/Obstetrics                             Anesthesia Physical Anesthesia Plan  ASA: 3  Anesthesia Plan: General   Post-op Pain Management:    Induction: Intravenous  PONV Risk Score and Plan: 3 and Ondansetron, Dexamethasone and Treatment may vary due to age or medical condition  Airway Management Planned: LMA  Additional Equipment:   Intra-op Plan:   Post-operative Plan: Extubation in OR  Informed Consent: I have reviewed the patients History and Physical, chart, labs and discussed the procedure including the risks, benefits and alternatives for the proposed anesthesia with the patient or authorized representative who has indicated his/her understanding and acceptance.     Dental advisory given  Plan Discussed with: CRNA  Anesthesia Plan Comments:        Anesthesia Quick Evaluation

## 2023-10-29 NOTE — Anesthesia Procedure Notes (Signed)
Procedure Name: LMA Insertion Date/Time: 10/29/2023 9:23 AM  Performed by: Kizzie Fantasia, CRNAPre-anesthesia Checklist: Patient identified, Emergency Drugs available, Suction available, Patient being monitored and Timeout performed Patient Re-evaluated:Patient Re-evaluated prior to induction Oxygen Delivery Method: Circle system utilized Preoxygenation: Pre-oxygenation with 100% oxygen Induction Type: IV induction Ventilation: Mask ventilation without difficulty LMA: LMA inserted LMA Size: 4.0 Number of attempts: 1 Placement Confirmation: positive ETCO2 and breath sounds checked- equal and bilateral Tube secured with: Tape Dental Injury: Teeth and Oropharynx as per pre-operative assessment

## 2023-10-29 NOTE — Op Note (Signed)
Preoperative diagnosis: Bilateral renal calculi  Postoperative diagnosis: Bilateral renal calculi  Procedures: 1.  Cystoscopy 2.  Bilateral retrograde pyelography with interpretation 3.  Bilateral ureteroscopic laser lithotripsy and stone removal 4.  Bilateral ureteral stent placement (6 x 24-no string)  Surgeon: Victoria Bruins MD  Anesthesia: General  Complications: None  EBL: Minimal  Specimens: Bilateral renal calculi  Disposition of specimen: [facility]  Intraoperative findings: A left retrograde pyelogram was performed with Omnipaque contrast and a 6 French ureteral catheter.  This demonstrated normal caliber ureter without filling defects or other abnormalities.  The renal collecting system did demonstrate multiple small filling defects consistent with the patient's known stone disease.  No hydronephrosis.  A right retrograde pyelogram was performed in a similar fashion with Omnipaque contrast and a 6 French ureteral catheter.  This also demonstrated a normal caliber ureter without filling defects or other abnormalities.  There were noted to be multiple filling defects within the renal collecting system consistent with the patient's known stone disease.  Again, no hydronephrosis was noted.  Indication: Victoria Andrade is a 77 year old female with recurrent urolithiasis.  She recently was noted to have growing stone burden in both kidneys.  Our initial plan was to perform left-sided ureteroscopic laser lithotripsy.  However, she has passed multiple stones since her last visit and has been having some intermittent right sided flank pain.  As such, we agreed to evaluate and potentially address stones on both sides with the above procedures.  The potential risks, complications, and expected recovery process was discussed in detail.  Informed consent was obtained.  Description of procedure: The patient was taken to the operating room and a general anesthetic was  administered.  She was given preoperative antibiotics, placed in the dorsolithotomy position, and prepped and draped in the usual sterile fashion.  Next, a preoperative timeout was performed.  Cystourethroscopy was then performed which revealed a normal bladder without tumors, stones, or other mucosal pathology.  Attention then turned to the left ureteral orifice.  A 6 French ureteral catheter was used to intubate the left ureteral orifice and Omnipaque contrast was injected with findings as dictated above.  A 0.38 sensor guidewire was advanced into the left renal collecting system.  A 12/14 ureteral access sheath was then advanced over the wire into the proximal ureter.  Flexible ureteroscopy was then performed.  There was less stone burden than expected based on the patient's preoperative imaging indicating that she likely had passed some of the stones.  A few stones of significant size were removed with a 0 tip nitinol basket.  All other stone burden appeared to be quite small and easily passable less than 2 mm.  A 0.38 sensor guidewire was left in place and the access sheath was removed.  The wire was then backloaded on the cystoscope and a 6 x 24 double-J ureteral stent was advanced over the wire using Seldinger technique.  The stent was positioned appropriately under fluoroscopic and cystoscopic guidance and the wire was removed.  Attention then turned to the contralateral side.  The right ureteral orifice was intubated with a 6 French ureteral catheter and Omnipaque contrast was injected.  Findings are as dictated above.  A 0.38 sensor guidewire was then advanced up into the right renal collecting system under fluoroscopic guidance.  A 12/14 ureteral access sheath was advanced over the wire to the proximal ureter.  Digital flexible ureteroscopy was performed.  There was more significant stone burden on the right side.  Much of the stone burden was then fragmented with a holmium laser with a 200 m fiber  on a setting of 0.3 J and 60 Hz.  Any significant remaining stone burden was removed intact with a 0 tip nitinol basket.  The wire was then left in place and the access sheath was removed.  The wire was backloaded on the cystoscope and a 6 x 24 double-J ureteral stent was advanced over the wire using Seldinger technique and position appropriately under fluoroscopic and cystoscopic guidance.  The wire was then removed with a good curl noted in the renal pelvis as well as within the bladder.  The patient's bladder was emptied and the procedure was ended.  She tolerated the procedure well without complications.  She was able to be awakened and transferred to the recovery unit in satisfactory condition.

## 2023-10-29 NOTE — Anesthesia Postprocedure Evaluation (Signed)
Anesthesia Post Note  Patient: Victoria Andrade  Procedure(s) Performed: CYSTOSCOPY/BILATERAL RETROGRADE PYELOGRAM/URETEROSCOPY/URETERAL STENT PLACEMENT/HOLMIUM LASER (Bilateral: Ureter)     Patient location during evaluation: PACU Anesthesia Type: General Level of consciousness: awake Pain management: pain level controlled Vital Signs Assessment: post-procedure vital signs reviewed and stable Respiratory status: spontaneous breathing, nonlabored ventilation and respiratory function stable Cardiovascular status: blood pressure returned to baseline and stable Postop Assessment: no apparent nausea or vomiting Anesthetic complications: no   No notable events documented.  Last Vitals:  Vitals:   10/29/23 1115 10/29/23 1130  BP: (!) 165/73 (!) 163/69  Pulse: 75 68  Resp: 12   Temp: (!) 36.4 C (!) 36.4 C  SpO2: 93% 94%    Last Pain:  Vitals:   10/29/23 1130  TempSrc:   PainSc: 0-No pain                 Annia Gomm P Araceli Arango

## 2023-10-29 NOTE — Transfer of Care (Signed)
Immediate Anesthesia Transfer of Care Note  Patient: Victoria Andrade  Procedure(s) Performed: CYSTOSCOPY/BILATERAL RETROGRADE PYELOGRAM/URETEROSCOPY/URETERAL STENT PLACEMENT/HOLMIUM LASER (Bilateral: Ureter)  Patient Location: PACU  Anesthesia Type:General  Level of Consciousness: sedated, patient cooperative, and responds to stimulation  Airway & Oxygen Therapy: Patient Spontanous Breathing and Patient connected to face mask oxygen  Post-op Assessment: Report given to RN and Post -op Vital signs reviewed and stable  Post vital signs: Reviewed and stable  Last Vitals:  Vitals Value Taken Time  BP    Temp    Pulse 84 10/29/23 1041  Resp 18 10/29/23 1041  SpO2 100 % 10/29/23 1041  Vitals shown include unfiled device data.  Last Pain:  Vitals:   10/29/23 0738  TempSrc: Oral  PainSc: 0-No pain         Complications: No notable events documented.

## 2023-10-29 NOTE — Interval H&P Note (Signed)
History and Physical Interval Note:  10/29/2023 8:10 AM  Victoria Andrade  has presented today for surgery, with the diagnosis of LEFT RENAL CALCULI.  The various methods of treatment have been discussed with the patient and family. After consideration of risks, benefits and other options for treatment, the patient has consented to  Procedure(s) with comments: CYSTOSCOPY/LEFT URETEROSCOPY/HOLMIUM LASER/LEFT URETERAL STENT PLACEMENT (Left) - 90 MINUTES NEEDED FOR CASE as a surgical intervention.  The patient's history has been reviewed, patient examined, no change in status, stable for surgery.  I have reviewed the patient's chart and labs.  Questions were answered to the patient's satisfaction.     [employer]

## 2023-10-30 ENCOUNTER — Encounter (HOSPITAL_COMMUNITY): Payer: Self-pay | Admitting: Urology

## 2023-11-10 DIAGNOSIS — N200 Calculus of kidney: Secondary | ICD-10-CM | POA: Diagnosis not present

## 2023-11-16 DIAGNOSIS — G5622 Lesion of ulnar nerve, left upper limb: Secondary | ICD-10-CM | POA: Diagnosis not present

## 2023-11-16 DIAGNOSIS — R251 Tremor, unspecified: Secondary | ICD-10-CM | POA: Diagnosis not present

## 2023-11-16 DIAGNOSIS — D472 Monoclonal gammopathy: Secondary | ICD-10-CM | POA: Diagnosis not present

## 2023-11-16 DIAGNOSIS — E1142 Type 2 diabetes mellitus with diabetic polyneuropathy: Secondary | ICD-10-CM | POA: Diagnosis not present

## 2023-11-19 DIAGNOSIS — G4733 Obstructive sleep apnea (adult) (pediatric): Secondary | ICD-10-CM | POA: Diagnosis not present

## 2023-12-05 ENCOUNTER — Inpatient Hospital Stay: Payer: Medicare PPO

## 2023-12-05 ENCOUNTER — Inpatient Hospital Stay: Payer: Medicare PPO | Attending: Hematology and Oncology | Admitting: Hematology and Oncology

## 2023-12-05 ENCOUNTER — Encounter: Payer: Self-pay | Admitting: Hematology and Oncology

## 2023-12-05 VITALS — BP 154/65 | HR 93 | Temp 97.5°F | Resp 16 | Ht 61.0 in | Wt 173.6 lb

## 2023-12-05 DIAGNOSIS — D472 Monoclonal gammopathy: Secondary | ICD-10-CM

## 2023-12-05 DIAGNOSIS — C8591 Non-Hodgkin lymphoma, unspecified, lymph nodes of head, face, and neck: Secondary | ICD-10-CM

## 2023-12-05 DIAGNOSIS — N200 Calculus of kidney: Secondary | ICD-10-CM

## 2023-12-05 DIAGNOSIS — I10 Essential (primary) hypertension: Secondary | ICD-10-CM

## 2023-12-05 DIAGNOSIS — M3500 Sicca syndrome, unspecified: Secondary | ICD-10-CM | POA: Diagnosis not present

## 2023-12-05 DIAGNOSIS — K5900 Constipation, unspecified: Secondary | ICD-10-CM | POA: Diagnosis not present

## 2023-12-05 DIAGNOSIS — G20A1 Parkinson's disease without dyskinesia, without mention of fluctuations: Secondary | ICD-10-CM

## 2023-12-05 DIAGNOSIS — E119 Type 2 diabetes mellitus without complications: Secondary | ICD-10-CM | POA: Diagnosis not present

## 2023-12-05 DIAGNOSIS — R2681 Unsteadiness on feet: Secondary | ICD-10-CM | POA: Diagnosis not present

## 2023-12-05 LAB — CBC WITH DIFFERENTIAL/PLATELET
Abs Immature Granulocytes: 0.04 10*3/uL (ref 0.00–0.07)
Basophils Absolute: 0.1 10*3/uL (ref 0.0–0.1)
Basophils Relative: 1 %
Eosinophils Absolute: 0 10*3/uL (ref 0.0–0.5)
Eosinophils Relative: 0 %
HCT: 42.4 % (ref 36.0–46.0)
Hemoglobin: 13.8 g/dL (ref 12.0–15.0)
Immature Granulocytes: 1 %
Lymphocytes Relative: 18 %
Lymphs Abs: 1.6 10*3/uL (ref 0.7–4.0)
MCH: 29.6 pg (ref 26.0–34.0)
MCHC: 32.5 g/dL (ref 30.0–36.0)
MCV: 91 fL (ref 80.0–100.0)
Monocytes Absolute: 0.4 10*3/uL (ref 0.1–1.0)
Monocytes Relative: 5 %
Neutro Abs: 6.5 10*3/uL (ref 1.7–7.7)
Neutrophils Relative %: 75 %
Platelets: 276 10*3/uL (ref 150–400)
RBC: 4.66 MIL/uL (ref 3.87–5.11)
RDW: 13.1 % (ref 11.5–15.5)
WBC: 8.6 10*3/uL (ref 4.0–10.5)
nRBC: 0 % (ref 0.0–0.2)

## 2023-12-05 LAB — CMP (CANCER CENTER ONLY)
ALT: 14 U/L (ref 0–44)
AST: 20 U/L (ref 15–41)
Albumin: 4.2 g/dL (ref 3.5–5.0)
Alkaline Phosphatase: 71 U/L (ref 38–126)
Anion gap: 8 (ref 5–15)
BUN: 27 mg/dL — ABNORMAL HIGH (ref 8–23)
CO2: 31 mmol/L (ref 22–32)
Calcium: 9.9 mg/dL (ref 8.9–10.3)
Chloride: 100 mmol/L (ref 98–111)
Creatinine: 1.11 mg/dL — ABNORMAL HIGH (ref 0.44–1.00)
GFR, Estimated: 52 mL/min — ABNORMAL LOW (ref >60)
Glucose, Bld: 208 mg/dL — ABNORMAL HIGH (ref 70–99)
Potassium: 3.8 mmol/L (ref 3.5–5.1)
Sodium: 139 mmol/L (ref 135–145)
Total Bilirubin: 0.5 mg/dL (ref <1.2)
Total Protein: 6.7 g/dL (ref 6.5–8.1)

## 2023-12-05 NOTE — Progress Notes (Signed)
[facility] CONSULT NOTE  Patient Care Team: Assunta Found, MD as PCP - General (Family Medicine) Ardell Isaacs, Forrestine Him, MD as Consulting Physician (Obstetrics and Gynecology)  CHIEF COMPLAINTS/PURPOSE OF CONSULTATION:  MGUS  ASSESSMENT & PLAN:  No problem-specific Assessment & Plan notes found for this encounter.  Orders Placed This Encounter  Procedures   CBC with Differential/Platelet    Standing Status:   Standing    Number of Occurrences:   22    Standing Expiration Date:   12/04/2024   CMP (Cancer Center only)    Standing Status:   Future    Standing Expiration Date:   12/04/2024   SPEP with reflex to IFE    Standing Status:   Future    Standing Expiration Date:   12/04/2024   UPEP/TP, 24-Hr Urine    Standing Status:   Future    Standing Expiration Date:   12/04/2024   Kappa/lambda light chains    Standing Status:   Future    Standing Expiration Date:   12/04/2024   Beta 2 microglobulin    Standing Status:   Future    Standing Expiration Date:   12/04/2024   IgG, IgA, IgM    Standing Status:   Future    Standing Expiration Date:   12/04/2024   Monoclonal Gammopathy of Undetermined Significance (MGUS) Detected monoclonal protein in blood work. Discussed the nature of MGUS, its potential transformation into multiple myeloma, and the need for further testing to understand the protein's characteristics and potential organ impact. -Order further blood work CBC, CMP, SPEP, kappa lambda light chain ratio, beta-2 microglobulin, immunoglobulin levels and a 24-hour urine collection for UPEP.  Parkinson's Disease Undergoing evaluation for Parkinson's disease with a neurologist. EMG, nerve conduction test, and skin biopsy performed, results pending. -Continue current management under neurologist.  Sjogren's Syndrome Chronic dry mouth reported, likely related to Sjogren's syndrome. -Continue current management under rheumatologist.  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 -Continue current management under ophthalmologist.  Balance Issues Reports of poor balance and falls, potentially related to Parkinson's disease. -Continue current management under neurologist.  Diabetes Chronic condition, no new symptoms reported. -Continue current management.  Hypertension Chronic condition, managed with medication. -Continue current management.  Constipation Temporary issue due to forgetting to take MiraLAX during recent travel. -Resume regular use of MiraLAX.  Kidney Stones History of recurrent kidney stones, recent surgery on one side with remaining stones on the other. -Continue current management under urologist.   HISTORY OF PRESENTING ILLNESS:  Victoria Andrade 77 y.o. female is here because of MGUS  Discussed the use of AI scribe software for clinical note transcription with the patient, who gave verbal consent to proceed.  History of Present Illness    The patient, with a history of Sjogren's syndrome, sleep apnea, diabetes, and hypertension, presents for follow-up after recent testing for possible Parkinson's disease which showed MGUS. She reports tremors and balance issues, having fallen multiple times due to balance problems. She also has a dry mouth from Sjogren's syndrome. She recently underwent an EMG, nerve conduction test, and skin biopsy, the results of which are pending. She also has a lymphoma in the eye, which is currently being monitored without intervention. She has a history of kidney stones and recently had surgery to remove some, with more still present. She also reports constipation, which she attributes to forgetting to take her MiraLAX while traveling.  Rest of the pertinent 10  point ROS reviewed and negative   REVIEW OF SYSTEMS:   Constitutional: Denies fevers, chills or abnormal night sweats Eyes: Denies blurriness of vision, double vision or watery eyes Ears, nose, mouth, throat,  and face: Denies mucositis or sore throat Respiratory: Denies cough, dyspnea or wheezes Cardiovascular: Denies palpitation, chest discomfort or lower extremity swelling Gastrointestinal:  Denies nausea, heartburn or change in bowel habits Skin: Denies abnormal skin rashes Lymphatics: Denies new lymphadenopathy or easy bruising Neurological:Denies numbness, tingling or new weaknesses Behavioral/Psych: Mood is stable, no new changes  All other systems were reviewed with the patient and are negative.  MEDICAL HISTORY:  Past Medical History:  Diagnosis Date   Anxiety    takes xanax   Arthritis    fingers and neck   Cataract immature    Diabetes mellitus without complication (HCC)    Elevated hemoglobin A1c 02/2015   level 6.2   Family history of adverse reaction to anesthesia    uncle had a stroke following anesthesia   Fatty liver    Fibromyalgia    GERD (gastroesophageal reflux disease)    History of bronchitis    History of hiatal hernia    History of kidney stones    Hypertension    IBS (irritable bowel syndrome)    Imbalance    Macular degeneration    Melanoma (HCC)    left eye   Migraine    Neuropathy    Osteopenia 2016   PONV (postoperative nausea and vomiting)    Benadryl help with N/V since she can no longer have SCOP; difficulty to awaken   Shingles 2021   Sjogren syndrome [facility])     SURGICAL HISTORY: Past Surgical History:  Procedure Laterality Date   ABDOMINAL HYSTERECTOMY  1978   partial   BILATERAL SALPINGOOPHORECTOMY  2010   with robot   BREAST BIOPSY Right 03/06/2008   benign   CATARACT EXTRACTION, BILATERAL  2018   CHOLECYSTECTOMY  2005   lap   COLONOSCOPY     CYSTOSCOPY N/A 03/13/2015   Procedure: CYSTOSCOPY;  Surgeon: Patton Salles, MD;  Location: [facility];  Service: Gynecology;  Laterality: N/A;   CYSTOSCOPY WITH RETROGRADE PYELOGRAM, URETEROSCOPY AND STENT PLACEMENT Left 12/17/2020   Procedure: CYSTOSCOPY WITH LEFT RETROGRADE  PYELOGRAM, URETEROSCOPY HOLMIUM LASER AND STENT PLACEMENT;  Surgeon: Heloise Purpura, MD;  Location: [facility];  Service: Urology;  Laterality: Left;  REQUESTING 2 HRS   CYSTOSCOPY/URETEROSCOPY/HOLMIUM LASER/STENT PLACEMENT Left 10/29/2020   Procedure: CYSTOSCOPY/RETROGRADE/URETEROSCOPY/HOLMIUM LASER/STENT PLACEMENT;  Surgeon: Heloise Purpura, MD;  Location: [facility];  Service: Urology;  Laterality: Left;   CYSTOSCOPY/URETEROSCOPY/HOLMIUM LASER/STENT PLACEMENT Left 01/21/2021   Procedure: CYSTOSCOPY/RETROGRADE/URETEROSCOPY/HOLMIUM LASER/STENT PLACEMENT;  Surgeon: Heloise Purpura, MD;  Location: [facility];  Service: Urology;  Laterality: Left;   CYSTOSCOPY/URETEROSCOPY/HOLMIUM LASER/STENT PLACEMENT Right 03/27/2022   Procedure: CYSTOSCOPY/RETROGRADE/URETEROSCOPY/HOLMIUM LASER/STENT PLACEMENT;  Surgeon: Heloise Purpura, MD;  Location: [facility];  Service: Urology;  Laterality: Right;   CYSTOSCOPY/URETEROSCOPY/HOLMIUM LASER/STENT PLACEMENT Bilateral 10/29/2023   Procedure: CYSTOSCOPY/BILATERAL RETROGRADE PYELOGRAM/URETEROSCOPY/URETERAL STENT PLACEMENT/HOLMIUM LASER;  Surgeon: Heloise Purpura, MD;  Location: [facility];  Service: Urology;  Laterality: Bilateral;  90 MINUTES NEEDED FOR CASE   IR NEPHRO TUBE REMOV/FL  03/20/2020   IR NEPHROSTOGRAM RIGHT THRU EXISTING ACCESS  03/20/2020   IR URETERAL STENT RIGHT NEW ACCESS W/O SEP NEPHROSTOMY CATH  03/15/2020   LITHOTRIPSY     20 years ago   NEPHROLITHOTOMY Right 03/15/2020   Procedure: NEPHROLITHOTOMY PERCUTANEOUS/ INTERVENTIONAL RADIOLOCGY TO PLACE POSTERIOR CALYX PERCUTANEOUS ACCESS PRIOR;  Surgeon:  Heloise Purpura, MD;  Location: [facility];  Service: Urology;  Laterality: Right;  NEEDS 150 MIN FOR PROCEDURE   ROBOTIC ASSISTED SALPINGO OOPHERECTOMY Left 03/13/2015   Procedure: ROBOTIC ASSISTED INCISION OF  LEFT PELVIC MASS, LYSIS OF ADHESIONS  WITH PELVIC WASHINGS;  Surgeon: Patton Salles, MD;  Location: [facility];  Service: Gynecology;  Laterality: Left;   SHOULDER SURGERY  Left    bone spurs   TONSILLECTOMY     as child   TUBAL LIGATION  1976   UPPER GI ENDOSCOPY      SOCIAL HISTORY: Social History   Socioeconomic History   Marital status: Widowed    Spouse name: Not on file   Number of children: Not on file   Years of education: Not on file   Highest education level: Not on file  Occupational History   Not on file  Tobacco Use   Smoking status: Never   Smokeless tobacco: Never  Vaping Use   Vaping status: Never Used  Substance and Sexual Activity   Alcohol use: Yes    Comment: rarely   Drug use: Never   Sexual activity: Not Currently    Partners: Male    Birth control/protection: Surgical    Comment: TAH 1978/BSO 2010  Other Topics Concern   Not on file  Social History Narrative   Not on file   Social Determinants of Health   Financial Resource Strain: Low Risk  (08/27/2022)   Overall Financial Resource Strain (CARDIA)    Difficulty of Paying Living Expenses: Not hard at all  Food Insecurity: No Food Insecurity (12/05/2023)   Hunger Vital Sign    Worried About Running Out of Food in the Last Year: Never true    Ran Out of Food in the Last Year: Never true  Transportation Needs: No Transportation Needs (12/05/2023)   PRAPARE - Administrator, Civil Service (Medical): No    Lack of Transportation (Non-Medical): No  Physical Activity: Not on file  Stress: Not on file  Social Connections: Not on file  Intimate Partner Violence: Not At Risk (12/05/2023)   Humiliation, Afraid, Rape, and Kick questionnaire    Fear of Current or Ex-Partner: No    Emotionally Abused: No    Physically Abused: No    Sexually Abused: No    FAMILY HISTORY: Family History  Adopted: Yes  Problem Relation Age of Onset   Hypertension Mother    Thyroid disease Mother    Diabetes Maternal Grandmother    Hypertension Maternal Grandmother    Heart attack Maternal Grandmother    Hypertension Maternal Grandfather    Stroke Maternal Grandfather     Stroke Maternal Uncle    Stroke Maternal Uncle    Heart attack Maternal Uncle    Heart attack Maternal Uncle    Lung cancer Maternal Uncle     ALLERGIES:  is allergic to amitriptyline hcl, bystolic [nebivolol hcl], escitalopram, hydroxychloroquine, latex, metformin, oxycodone, prednisone, adhesive [tape], aspirin, empagliflozin, glimepiride, glipizide, hydrocodone, other, tramadol, cephalosporins, chlorhexidine, ciprofloxacin, codeine, and penicillins.  MEDICATIONS:  Current Outpatient Medications  Medication Sig Dispense Refill   ACCU-CHEK GUIDE test strip      Accu-Chek Softclix Lancets lancets      ALPRAZolam (XANAX) 1 MG tablet Take 0.5-1.5 mg by mouth See admin instructions. Take 1.5 tablets (1.5 mg) by mouth scheduled every night & may take 0.5 tablet (0.5 mg) by mouth daily if needed for anxiety.     Blood Glucose Monitoring Suppl (  ACCU-CHEK GUIDE) w/Device KIT      Cholecalciferol (VITAMIN D3) 5000 UNITS CAPS Take 5,000 Units by mouth in the morning.     diphenhydrAMINE (BENADRYL) 25 MG tablet Take 25 mg by mouth at bedtime.     docusate sodium (COLACE) 100 MG capsule Take 100 mg by mouth 2 (two) times daily as needed (constipation.).     DULoxetine (CYMBALTA) 60 MG capsule Take 60 mg by mouth at bedtime.     ezetimibe (ZETIA) 10 MG tablet Take 10 mg by mouth at bedtime.     famotidine (PEPCID) 20 MG tablet Take 20 mg by mouth daily as needed for heartburn or indigestion.      FIBER ADULT GUMMIES PO Take 2 tablets by mouth 2 (two) times a week.     gabapentin (NEURONTIN) 100 MG capsule Take 100-200 mg by mouth See admin instructions. Take 1 capsule (100 mg) by mouth in the morning & take 2 capsules (200 mg) by mouth at bedtime.     hydrochlorothiazide (HYDRODIURIL) 25 MG tablet Take 50 mg by mouth in the morning.     LINZESS 290 MCG CAPS capsule Take 290 mcg by mouth daily as needed (constipation).     loratadine (CLARITIN) 10 MG tablet Take 10 mg by mouth daily.     montelukast  (SINGULAIR) 10 MG tablet Take 10 mg by mouth at bedtime.     Multiple Vitamins-Minerals (PRESERVISION AREDS 2 PO) Take 1 tablet by mouth in the morning and at bedtime.     OZEMPIC, 1 MG/DOSE, 4 MG/3ML SOPN Inject 1 mg into the skin every Saturday.     Polyethyl Glycol-Propyl Glycol (SYSTANE ULTRA PF OP) Place 1 drop into both eyes in the morning and at bedtime.      polyethylene glycol (MIRALAX / GLYCOLAX) 17 g packet Take 17 g by mouth in the morning.     rosuvastatin (CRESTOR) 5 MG tablet Take 5 mg by mouth every evening.     simethicone (MYLICON) 80 MG chewable tablet Chew 80 mg by mouth daily as needed (gas).     traMADol (ULTRAM) 50 MG tablet Take 1-2 tablets (50-100 mg total) by mouth every 6 (six) hours as needed (pain). 15 tablet 0   No current facility-administered medications for this visit.     PHYSICAL EXAMINATION: ECOG PERFORMANCE STATUS: 1 - Symptomatic but completely ambulatory  Vitals:   12/05/23 1141  BP: (!) 154/65  Pulse: 93  Resp: 16  Temp: (!) 97.5 F (36.4 C)  SpO2: 99%   Filed Weights   12/05/23 1141  Weight: 173 lb 9.6 oz (78.7 kg)    GENERAL:alert, no distress and comfortable SKIN: skin color, texture, turgor are normal, no rashes or significant lesions EYES: normal, conjunctiva are pink and non-injected, sclera clear OROPHARYNX:no exudate, no erythema and lips, buccal mucosa, and tongue normal  NECK: supple, thyroid normal size, non-tender, without nodularity LYMPH:  no palpable lymphadenopathy in the cervical, axillary LUNGS: clear to auscultation and percussion with normal breathing effort HEART: regular rate & rhythm and no murmurs and no lower extremity edema ABDOMEN:abdomen soft, non-tender and normal bowel sounds Musculoskeletal:no cyanosis of digits and no clubbing  PSYCH: alert & oriented x 3 with fluent speech NEURO: no focal motor/sensory deficits  LABORATORY DATA:  I have reviewed the data as listed Lab Results  Component Value Date    WBC 11.1 (H) 10/22/2023   HGB 14.6 10/22/2023   HCT 44.8 10/22/2023   MCV 91.8 10/22/2023   PLT  274 10/22/2023     Chemistry      Component Value Date/Time   NA 141 10/22/2023 1330   NA 142 05/06/2023 0911   K 3.2 (L) 10/22/2023 1330   CL 103 10/22/2023 1330   CO2 26 10/22/2023 1330   BUN 20 10/22/2023 1330   BUN 17 05/06/2023 0911   CREATININE 0.98 10/22/2023 1330      Component Value Date/Time   CALCIUM 9.8 10/22/2023 1330       RADIOGRAPHIC STUDIES: I have personally reviewed the radiological images as listed and agreed with the findings in the report. No results found.  All questions were answered. The patient knows to call the clinic with any problems, questions or concerns. I spent 45 minutes in the care of this patient including H and P, review of records, counseling and coordination of care.     Rachel Moulds, MD 12/05/2023 12:01 PM

## 2023-12-07 LAB — KAPPA/LAMBDA LIGHT CHAINS
Kappa free light chain: 17 mg/L (ref 3.3–19.4)
Kappa, lambda light chain ratio: 1.45 (ref 0.26–1.65)
Lambda free light chains: 11.7 mg/L (ref 5.7–26.3)

## 2023-12-07 LAB — BETA 2 MICROGLOBULIN, SERUM: Beta-2 Microglobulin: 2.5 mg/L — ABNORMAL HIGH (ref 0.6–2.4)

## 2023-12-07 LAB — IGG, IGA, IGM
IgA: 137 mg/dL (ref 64–422)
IgG (Immunoglobin G), Serum: 637 mg/dL (ref 586–1602)
IgM (Immunoglobulin M), Srm: 63 mg/dL (ref 26–217)

## 2023-12-10 LAB — PROTEIN ELECTROPHORESIS, SERUM, WITH REFLEX
A/G Ratio: 1.3 (ref 0.7–1.7)
Albumin ELP: 3.6 g/dL (ref 2.9–4.4)
Alpha-1-Globulin: 0.2 g/dL (ref 0.0–0.4)
Alpha-2-Globulin: 0.9 g/dL (ref 0.4–1.0)
Beta Globulin: 1.1 g/dL (ref 0.7–1.3)
Gamma Globulin: 0.5 g/dL (ref 0.4–1.8)
Globulin, Total: 2.7 g/dL (ref 2.2–3.9)
M-Spike, %: 0.2 g/dL — ABNORMAL HIGH
SPEP Interpretation: 0
Total Protein ELP: 6.3 g/dL (ref 6.0–8.5)

## 2023-12-10 LAB — IMMUNOFIXATION REFLEX, SERUM
IgA: 132 mg/dL (ref 64–422)
IgG (Immunoglobin G), Serum: 625 mg/dL (ref 586–1602)
IgM (Immunoglobulin M), Srm: 65 mg/dL (ref 26–217)

## 2023-12-15 ENCOUNTER — Other Ambulatory Visit: Payer: Self-pay

## 2023-12-15 DIAGNOSIS — G20C Parkinsonism, unspecified: Secondary | ICD-10-CM | POA: Diagnosis not present

## 2023-12-15 DIAGNOSIS — E119 Type 2 diabetes mellitus without complications: Secondary | ICD-10-CM | POA: Diagnosis not present

## 2023-12-15 DIAGNOSIS — D472 Monoclonal gammopathy: Secondary | ICD-10-CM | POA: Diagnosis not present

## 2023-12-15 DIAGNOSIS — G20A1 Parkinson's disease without dyskinesia, without mention of fluctuations: Secondary | ICD-10-CM | POA: Diagnosis not present

## 2023-12-15 DIAGNOSIS — N200 Calculus of kidney: Secondary | ICD-10-CM | POA: Diagnosis not present

## 2023-12-15 DIAGNOSIS — K5900 Constipation, unspecified: Secondary | ICD-10-CM | POA: Diagnosis not present

## 2023-12-15 DIAGNOSIS — I10 Essential (primary) hypertension: Secondary | ICD-10-CM | POA: Diagnosis not present

## 2023-12-15 DIAGNOSIS — M3500 Sicca syndrome, unspecified: Secondary | ICD-10-CM | POA: Diagnosis not present

## 2023-12-15 DIAGNOSIS — C8591 Non-Hodgkin lymphoma, unspecified, lymph nodes of head, face, and neck: Secondary | ICD-10-CM | POA: Diagnosis not present

## 2023-12-19 DIAGNOSIS — G4733 Obstructive sleep apnea (adult) (pediatric): Secondary | ICD-10-CM | POA: Diagnosis not present

## 2023-12-24 LAB — UPEP/TP, 24-HR URINE
Albumin, U: 46.9 %
Alpha 1, Urine: 1.8 %
Alpha 2, Urine: 2.8 %
Beta, Urine: 43.7 %
Gamma Globulin, Urine: 4.7 %
Total Protein, Urine-Ur/day: 153 mg/(24.h) — ABNORMAL HIGH (ref 30–150)
Total Protein, Urine: 15.3 mg/dL
Total Volume: 1000

## 2023-12-29 DIAGNOSIS — E782 Mixed hyperlipidemia: Secondary | ICD-10-CM | POA: Diagnosis not present

## 2023-12-29 DIAGNOSIS — G4733 Obstructive sleep apnea (adult) (pediatric): Secondary | ICD-10-CM | POA: Diagnosis not present

## 2023-12-29 DIAGNOSIS — E11610 Type 2 diabetes mellitus with diabetic neuropathic arthropathy: Secondary | ICD-10-CM | POA: Diagnosis not present

## 2023-12-31 ENCOUNTER — Inpatient Hospital Stay: Payer: Medicare PPO | Attending: Hematology and Oncology | Admitting: Hematology and Oncology

## 2023-12-31 DIAGNOSIS — E785 Hyperlipidemia, unspecified: Secondary | ICD-10-CM | POA: Diagnosis not present

## 2023-12-31 DIAGNOSIS — D472 Monoclonal gammopathy: Secondary | ICD-10-CM

## 2023-12-31 DIAGNOSIS — E041 Nontoxic single thyroid nodule: Secondary | ICD-10-CM | POA: Diagnosis not present

## 2023-12-31 DIAGNOSIS — E1165 Type 2 diabetes mellitus with hyperglycemia: Secondary | ICD-10-CM | POA: Diagnosis not present

## 2023-12-31 NOTE — Progress Notes (Signed)
 [facility] CONSULT NOTE  Patient Care Team: Marvine Rush, MD as PCP - General (Family Medicine) Cathlyn JAYSON Cary, Bobie BRAVO, MD as Consulting Physician (Obstetrics and Gynecology)  CHIEF COMPLAINTS/PURPOSE OF CONSULTATION:  MGUS  ASSESSMENT & PLAN:   This is a very pleasant 78 year old female patient referred to hematology for evaluation of MGUS.  Monoclonal Gammopathy of Undetermined Significance (MGUS), Ig G Kappa  No high risk features, M protein 0.2 g/dl, UPEP neg. K/L ratio normal. CBC normal, no evidence of endorgan damage At this time given low risk MGUS, have recommended annual follow-up with repeat labs.  I prefer the labs to be done about a week before her follow-up.  We will need CBC, CMP, SPEP, kappa lambda ratio, immunoglobulin levels for her next visit. I once again explained to her about MGUS, she understands it is a premalignant condition and will need annual follow-up with labs.  She was strongly encouraged to contact us  with any new questions or concerns or if she needs a sooner appointment and she expressed understanding.   HISTORY OF PRESENTING ILLNESS:  Victoria Andrade 78 y.o. female is here because of MGUS  Discussed the use of AI scribe software for clinical note transcription with the patient, who gave verbal consent to proceed.  History of Present Illness    The patient, with a history of Sjogren's syndrome, sleep apnea, diabetes, and hypertension, presents for follow-up after recent testing for possible Parkinson's disease which showed MGUS.   Victoria Andrade is here for telephone visit to review her most recent lab results.  She denies any new health complaints.  She just had a few more questions about the MGUS.   MEDICAL HISTORY:  Past Medical History:  Diagnosis Date   Anxiety    takes xanax    Arthritis    fingers and neck   Cataract immature    Diabetes mellitus without complication (HCC)    Elevated hemoglobin A1c 02/2015   level 6.2    Family history of adverse reaction to anesthesia    uncle had a stroke following anesthesia   Fatty liver    Fibromyalgia    GERD (gastroesophageal reflux disease)    History of bronchitis    History of hiatal hernia    History of kidney stones    Hypertension    IBS (irritable bowel syndrome)    Imbalance    Macular degeneration    Melanoma (HCC)    left eye   Migraine    Neuropathy    Osteopenia 2016   PONV (postoperative nausea and vomiting)    Benadryl  help with N/V since she can no longer have SCOP; difficulty to awaken   Shingles 2021   Sjogren syndrome [facility])     SURGICAL HISTORY: Past Surgical History:  Procedure Laterality Date   ABDOMINAL HYSTERECTOMY  1978   partial   BILATERAL SALPINGOOPHORECTOMY  2010   with robot   BREAST BIOPSY Right 03/06/2008   benign   CATARACT EXTRACTION, BILATERAL  2018   CHOLECYSTECTOMY  2005   lap   COLONOSCOPY     CYSTOSCOPY N/A 03/13/2015   Procedure: CYSTOSCOPY;  Surgeon: Bobie BRAVO Cathlyn JAYSON Cary, MD;  Location: [facility];  Service: Gynecology;  Laterality: N/A;   CYSTOSCOPY WITH RETROGRADE PYELOGRAM, URETEROSCOPY AND STENT PLACEMENT Left 12/17/2020   Procedure: CYSTOSCOPY WITH LEFT RETROGRADE PYELOGRAM, URETEROSCOPY HOLMIUM LASER AND STENT PLACEMENT;  Surgeon: Renda Glance, MD;  Location: [facility];  Service: Urology;  Laterality: Left;  REQUESTING 2  HRS   CYSTOSCOPY/URETEROSCOPY/HOLMIUM LASER/STENT PLACEMENT Left 10/29/2020   Procedure: CYSTOSCOPY/RETROGRADE/URETEROSCOPY/HOLMIUM LASER/STENT PLACEMENT;  Surgeon: Renda Glance, MD;  Location: [facility];  Service: Urology;  Laterality: Left;   CYSTOSCOPY/URETEROSCOPY/HOLMIUM LASER/STENT PLACEMENT Left 01/21/2021   Procedure: CYSTOSCOPY/RETROGRADE/URETEROSCOPY/HOLMIUM LASER/STENT PLACEMENT;  Surgeon: Renda Glance, MD;  [facility];  Service: Urology;  Laterality: Left;   CYSTOSCOPY/URETEROSCOPY/HOLMIUM LASER/STENT PLACEMENT Right 03/27/2022   Procedure:  CYSTOSCOPY/RETROGRADE/URETEROSCOPY/HOLMIUM LASER/STENT PLACEMENT;  Surgeon: Renda Glance, MD;  [facility];  Service: Urology;  Laterality: Right;   CYSTOSCOPY/URETEROSCOPY/HOLMIUM LASER/STENT PLACEMENT Bilateral 10/29/2023   Procedure: CYSTOSCOPY/BILATERAL RETROGRADE PYELOGRAM/URETEROSCOPY/URETERAL STENT PLACEMENT/HOLMIUM LASER;  Surgeon: Renda Glance, MD;  [facility];  Service: Urology;  Laterality: Bilateral;  90 MINUTES NEEDED FOR CASE   IR NEPHRO TUBE REMOV/FL  03/20/2020   IR NEPHROSTOGRAM RIGHT THRU EXISTING ACCESS  03/20/2020   IR URETERAL STENT RIGHT NEW ACCESS W/O SEP NEPHROSTOMY CATH  03/15/2020   LITHOTRIPSY     20 years ago   NEPHROLITHOTOMY Right 03/15/2020   Procedure: NEPHROLITHOTOMY PERCUTANEOUS/ INTERVENTIONAL RADIOLOCGY TO PLACE POSTERIOR CALYX PERCUTANEOUS ACCESS PRIOR;  Surgeon: Renda Glance, MD;  [facility];  Service: Urology;  Laterality: Right;  NEEDS 150 MIN FOR PROCEDURE   ROBOTIC ASSISTED SALPINGO OOPHERECTOMY Left 03/13/2015   Procedure: ROBOTIC ASSISTED INCISION OF  LEFT PELVIC MASS, LYSIS OF ADHESIONS  WITH PELVIC WASHINGS;  Surgeon: Bobie FORBES Cathlyn JAYSON Nikki, MD;  Location: [facility];  Service: Gynecology;  Laterality: Left;   SHOULDER SURGERY Left    bone spurs   TONSILLECTOMY     as child   TUBAL LIGATION  1976   UPPER GI ENDOSCOPY      SOCIAL HISTORY: Social History   Socioeconomic History   Marital status: Widowed    Spouse name: Not on file   Number of children: Not on file   Years of education: Not on file   Highest education level: Not on file  Occupational History   Not on file  Tobacco Use   Smoking status: Never   Smokeless tobacco: Never  Vaping Use   Vaping status: Never Used  Substance and Sexual Activity   Alcohol use: Yes    Comment: rarely   Drug use: Never   Sexual activity: Not Currently    Partners: Male    Birth control/protection: Surgical    Comment: TAH 1978/BSO 2010  Other Topics Concern   Not on file   Social History Narrative   Not on file   Social Drivers of Health   Financial Resource Strain: Low Risk  (08/27/2022)   Overall Financial Resource Strain (CARDIA)    Difficulty of Paying Living Expenses: Not hard at all  Food Insecurity: No Food Insecurity (12/05/2023)   Hunger Vital Sign    Worried About Running Out of Food in the Last Year: Never true    Ran Out of Food in the Last Year: Never true  Transportation Needs: No Transportation Needs (12/05/2023)   PRAPARE - Administrator, Civil Service (Medical): No    Lack of Transportation (Non-Medical): No  Physical Activity: Not on file  Stress: Not on file  Social Connections: Not on file  Intimate Partner Violence: Not At Risk (12/05/2023)   Humiliation, Afraid, Rape, and Kick questionnaire    Fear of Current or Ex-Partner: No    Emotionally Abused: No    Physically Abused: No    Sexually Abused: No    FAMILY HISTORY: Family History  Adopted: Yes  Problem Relation Age of Onset  Hypertension Mother    Thyroid  disease Mother    Diabetes Maternal Grandmother    Hypertension Maternal Grandmother    Heart attack Maternal Grandmother    Hypertension Maternal Grandfather    Stroke Maternal Grandfather    Stroke Maternal Uncle    Stroke Maternal Uncle    Heart attack Maternal Uncle    Heart attack Maternal Uncle    Lung cancer Maternal Uncle     ALLERGIES:  is allergic to amitriptyline hcl, bystolic [nebivolol hcl], escitalopram, hydroxychloroquine, latex, metformin, oxycodone, prednisone, adhesive [tape], aspirin, empagliflozin, glimepiride, glipizide , hydrocodone , other, tramadol , cephalosporins, chlorhexidine , ciprofloxacin, codeine, and penicillins.  MEDICATIONS:  Current Outpatient Medications  Medication Sig Dispense Refill   ACCU-CHEK GUIDE test strip      Accu-Chek Softclix Lancets lancets      ALPRAZolam  (XANAX ) 1 MG tablet Take 0.5-1.5 mg by mouth See admin instructions. Take 1.5 tablets (1.5 mg) by  mouth scheduled every night & may take 0.5 tablet (0.5 mg) by mouth daily if needed for anxiety.     Blood Glucose Monitoring Suppl (ACCU-CHEK GUIDE) w/Device KIT      Cholecalciferol (VITAMIN D3) 5000 UNITS CAPS Take 5,000 Units by mouth in the morning.     diphenhydrAMINE  (BENADRYL ) 25 MG tablet Take 25 mg by mouth at bedtime.     docusate sodium  (COLACE) 100 MG capsule Take 100 mg by mouth 2 (two) times daily as needed (constipation.).     DULoxetine  (CYMBALTA ) 60 MG capsule Take 60 mg by mouth at bedtime.     ezetimibe (ZETIA) 10 MG tablet Take 10 mg by mouth at bedtime.     famotidine  (PEPCID ) 20 MG tablet Take 20 mg by mouth daily as needed for heartburn or indigestion.      FIBER ADULT GUMMIES PO Take 2 tablets by mouth 2 (two) times a week.     gabapentin (NEURONTIN) 100 MG capsule Take 100-200 mg by mouth See admin instructions. Take 1 capsule (100 mg) by mouth in the morning & take 2 capsules (200 mg) by mouth at bedtime.     hydrochlorothiazide  (HYDRODIURIL ) 25 MG tablet Take 50 mg by mouth in the morning.     LINZESS  290 MCG CAPS capsule Take 290 mcg by mouth daily as needed (constipation).     loratadine  (CLARITIN ) 10 MG tablet Take 10 mg by mouth daily.     montelukast  (SINGULAIR ) 10 MG tablet Take 10 mg by mouth at bedtime.     Multiple Vitamins-Minerals (PRESERVISION AREDS 2 PO) Take 1 tablet by mouth in the morning and at bedtime.     OZEMPIC, 1 MG/DOSE, 4 MG/3ML SOPN Inject 1 mg into the skin every Saturday.     Polyethyl Glycol-Propyl Glycol (SYSTANE ULTRA PF OP) Place 1 drop into both eyes in the morning and at bedtime.      polyethylene glycol (MIRALAX / GLYCOLAX) 17 g packet Take 17 g by mouth in the morning.     rosuvastatin (CRESTOR) 5 MG tablet Take 5 mg by mouth every evening.     simethicone  (MYLICON) 80 MG chewable tablet Chew 80 mg by mouth daily as needed (gas).     traMADol  (ULTRAM ) 50 MG tablet Take 1-2 tablets (50-100 mg total) by mouth every 6 (six) hours as  needed (pain). 15 tablet 0   No current facility-administered medications for this visit.     PHYSICAL EXAMINATION: ECOG PERFORMANCE STATUS: 1 - Symptomatic but completely ambulatory  There were no vitals filed for this visit.  There were no vitals filed for this visit.  Physical exam deferred, telephone visit  LABORATORY DATA:  I have reviewed the data as listed Lab Results  Component Value Date   WBC 8.6 12/05/2023   HGB 13.8 12/05/2023   HCT 42.4 12/05/2023   MCV 91.0 12/05/2023   PLT 276 12/05/2023     Chemistry      Component Value Date/Time   NA 139 12/05/2023 1207   NA 142 05/06/2023 0911   K 3.8 12/05/2023 1207   CL 100 12/05/2023 1207   CO2 31 12/05/2023 1207   BUN 27 (H) 12/05/2023 1207   BUN 17 05/06/2023 0911   CREATININE 1.11 (H) 12/05/2023 1207      Component Value Date/Time   CALCIUM 9.9 12/05/2023 1207   ALKPHOS 71 12/05/2023 1207   AST 20 12/05/2023 1207   ALT 14 12/05/2023 1207   BILITOT 0.5 12/05/2023 1207       RADIOGRAPHIC STUDIES: I have personally reviewed the radiological images as listed and agreed with the findings in the report. No results found.  All questions were answered. The patient knows to call the clinic with any problems, questions or concerns. I spent 10 minutes in the care of this patient including H and P, review of records, counseling and coordination of care.  I connected with  Orie KATHEE Bowman on 12/31/23 by a telephone application and verified that I am speaking with the correct person using two identifiers.   I discussed the limitations of evaluation and management by telemedicine. The patient expressed understanding and agreed to proceed.   Amber Stalls, MD 12/31/2023 8:31 AM

## 2024-01-06 DIAGNOSIS — E11610 Type 2 diabetes mellitus with diabetic neuropathic arthropathy: Secondary | ICD-10-CM | POA: Diagnosis not present

## 2024-01-06 DIAGNOSIS — E041 Nontoxic single thyroid nodule: Secondary | ICD-10-CM | POA: Diagnosis not present

## 2024-01-06 DIAGNOSIS — E1165 Type 2 diabetes mellitus with hyperglycemia: Secondary | ICD-10-CM | POA: Diagnosis not present

## 2024-01-06 DIAGNOSIS — E785 Hyperlipidemia, unspecified: Secondary | ICD-10-CM | POA: Diagnosis not present

## 2024-01-13 ENCOUNTER — Telehealth: Payer: Medicare PPO | Admitting: Hematology and Oncology

## 2024-01-19 DIAGNOSIS — G4733 Obstructive sleep apnea (adult) (pediatric): Secondary | ICD-10-CM | POA: Diagnosis not present

## 2024-01-19 DIAGNOSIS — F419 Anxiety disorder, unspecified: Secondary | ICD-10-CM | POA: Diagnosis not present

## 2024-01-19 DIAGNOSIS — B0229 Other postherpetic nervous system involvement: Secondary | ICD-10-CM | POA: Diagnosis not present

## 2024-01-19 DIAGNOSIS — Z6831 Body mass index (BMI) 31.0-31.9, adult: Secondary | ICD-10-CM | POA: Diagnosis not present

## 2024-01-19 DIAGNOSIS — E6609 Other obesity due to excess calories: Secondary | ICD-10-CM | POA: Diagnosis not present

## 2024-01-20 DIAGNOSIS — R1310 Dysphagia, unspecified: Secondary | ICD-10-CM | POA: Diagnosis not present

## 2024-01-20 DIAGNOSIS — Z1211 Encounter for screening for malignant neoplasm of colon: Secondary | ICD-10-CM | POA: Diagnosis not present

## 2024-01-20 DIAGNOSIS — K5900 Constipation, unspecified: Secondary | ICD-10-CM | POA: Diagnosis not present

## 2024-01-20 DIAGNOSIS — K219 Gastro-esophageal reflux disease without esophagitis: Secondary | ICD-10-CM | POA: Diagnosis not present

## 2024-01-20 DIAGNOSIS — K649 Unspecified hemorrhoids: Secondary | ICD-10-CM | POA: Diagnosis not present

## 2024-02-15 NOTE — Progress Notes (Unsigned)
 78 y.o. G32P0011 Widowed Caucasian female here for a breast and pelvic exam.    Continues to note lumps under her right breast.  Benign biopsy of right breast in 2009, different location.   Had dx mammogram and right breast US 12/31/21 for the lumpiness of breast.  No evidence of malignancy.   The patient is also followed for sebaceous cysts.  Thinks a cyst is enlarging.  Not painful or draining.   Her boyfriend is staying with her.  Had to put the greyhound down last night.   Not sexually active.    PCP: Assunta Found, MD   Patient's last menstrual period was 12/29/1976 (approximate).           Sexually active: No.  The current method of family planning is status post hysterectomy.    Menopausal hormone therapy:  n/a Exercising: Yes.     walking Smoker:  no  OB History     Gravida  2   Para  1   Term      Preterm      AB  1   Living  1      SAB  1   IAB      Ectopic      Multiple      Live Births              HEALTH MAINTENANCE: Last 2 paps: 2010 neg History of abnormal Pap or positive HPV:  no Mammogram:  08/18/23 Breast Density Cat C, BI-RADS CAT 1 neg Colonoscopy:  2014 Bone Density:  08/20/23  Result  osteopenia.  FRAX:  19.7%/3.5%.  Immunization History  Administered Date(s) Administered   Janssen (J&J) SARS-COV-2 Vaccination 04/03/2020      reports that she has never smoked. She has never used smokeless tobacco. She reports current alcohol use. She reports that she does not use drugs.  Past Medical History:  Diagnosis Date   Anxiety    takes xanax   Arthritis    fingers and neck   Cataract immature    Diabetes mellitus without complication (HCC)    Elevated hemoglobin A1c 02/2015   level 6.2   Family history of adverse reaction to anesthesia    uncle had a stroke following anesthesia   Fatty liver    Fibromyalgia    GERD (gastroesophageal reflux disease)    History of bronchitis    History of hiatal hernia    History of  kidney stones    Hypertension    IBS (irritable bowel syndrome)    Imbalance    Macular degeneration    Melanoma (HCC)    left eye   Migraine    Neuropathy    Osteopenia 2016   PONV (postoperative nausea and vomiting)    Benadryl help with N/V since she can no longer have SCOP; difficulty to awaken   Shingles 2021   Sjogren syndrome [facility])     Past Surgical History:  Procedure Laterality Date   ABDOMINAL HYSTERECTOMY  1978   partial   BILATERAL SALPINGOOPHORECTOMY  2010   with robot   BREAST BIOPSY Right 03/06/2008   benign   CATARACT EXTRACTION, BILATERAL  2018   CHOLECYSTECTOMY  2005   lap   COLONOSCOPY     CYSTOSCOPY N/A 03/13/2015   Procedure: CYSTOSCOPY;  Surgeon: Patton Salles, MD;  Location: [facility];  Service: Gynecology;  Laterality: N/A;   CYSTOSCOPY WITH RETROGRADE PYELOGRAM, URETEROSCOPY AND STENT PLACEMENT Left 12/17/2020   Procedure: CYSTOSCOPY  WITH LEFT RETROGRADE PYELOGRAM, URETEROSCOPY HOLMIUM LASER AND STENT PLACEMENT;  Surgeon: Heloise Purpura, MD;  [facility];  Service: Urology;  Laterality: Left;  REQUESTING 2 HRS   CYSTOSCOPY/URETEROSCOPY/HOLMIUM LASER/STENT PLACEMENT Left 10/29/2020   Procedure: CYSTOSCOPY/RETROGRADE/URETEROSCOPY/HOLMIUM LASER/STENT PLACEMENT;  Surgeon: Heloise Purpura, MD;  [facility];  Service: Urology;  Laterality: Left;   CYSTOSCOPY/URETEROSCOPY/HOLMIUM LASER/STENT PLACEMENT Left 01/21/2021   Procedure: CYSTOSCOPY/RETROGRADE/URETEROSCOPY/HOLMIUM LASER/STENT PLACEMENT;  Surgeon: Heloise Purpura, MD;  [facility];  Service: Urology;  Laterality: Left;   CYSTOSCOPY/URETEROSCOPY/HOLMIUM LASER/STENT PLACEMENT Right 03/27/2022   Procedure: CYSTOSCOPY/RETROGRADE/URETEROSCOPY/HOLMIUM LASER/STENT PLACEMENT;  Surgeon: Heloise Purpura, MD;  [facility];  Service: Urology;  Laterality: Right;   CYSTOSCOPY/URETEROSCOPY/HOLMIUM LASER/STENT PLACEMENT Bilateral 10/29/2023   Procedure: CYSTOSCOPY/BILATERAL RETROGRADE  PYELOGRAM/URETEROSCOPY/URETERAL STENT PLACEMENT/HOLMIUM LASER;  Surgeon: Heloise Purpura, MD;  [facility];  Service: Urology;  Laterality: Bilateral;  90 MINUTES NEEDED FOR CASE   IR NEPHRO TUBE REMOV/FL  03/20/2020   IR NEPHROSTOGRAM RIGHT THRU EXISTING ACCESS  03/20/2020   IR URETERAL STENT RIGHT NEW ACCESS W/O SEP NEPHROSTOMY CATH  03/15/2020   LITHOTRIPSY     20 years ago   NEPHROLITHOTOMY Right 03/15/2020   Procedure: NEPHROLITHOTOMY PERCUTANEOUS/ INTERVENTIONAL RADIOLOCGY TO PLACE POSTERIOR CALYX PERCUTANEOUS ACCESS PRIOR;  Surgeon: Heloise Purpura, MD;  [facility]: [facility];  Service: Urology;  Laterality: Right;  NEEDS 150 MIN FOR PROCEDURE   ROBOTIC ASSISTED SALPINGO OOPHERECTOMY Left 03/13/2015   Procedure: ROBOTIC ASSISTED INCISION OF  LEFT PELVIC MASS, LYSIS OF ADHESIONS  WITH PELVIC WASHINGS;  Surgeon: Patton Salles, MD;  [facility];  Service: Gynecology;  Laterality: Left;   SHOULDER SURGERY Left    bone spurs   TONSILLECTOMY     as child   TUBAL LIGATION  1976   UPPER GI ENDOSCOPY      Current Outpatient Medications  Medication Sig Dispense Refill   ACCU-CHEK GUIDE test strip      Accu-Chek Softclix Lancets lancets      ALPRAZolam (XANAX) 1 MG tablet Take 0.5-1.5 mg by mouth See admin instructions. Take 1.5 tablets (1.5 mg) by mouth scheduled every night & may take 0.5 tablet (0.5 mg) by mouth daily if needed for anxiety.     Blood Glucose Monitoring Suppl (ACCU-CHEK GUIDE) w/Device KIT      Cholecalciferol (VITAMIN D3) 5000 UNITS CAPS Take 5,000 Units by mouth in the morning.     diphenhydrAMINE (BENADRYL) 25 MG tablet Take 25 mg by mouth at bedtime.     docusate sodium (COLACE) 100 MG capsule Take 100 mg by mouth 2 (two) times daily as needed (constipation.).     DULoxetine (CYMBALTA) 60 MG capsule Take 60 mg by mouth at bedtime.     ezetimibe (ZETIA) 10 MG tablet Take 10 mg by mouth at bedtime.     famotidine (PEPCID) 20 MG tablet Take 20 mg by mouth daily  as needed for heartburn or indigestion.      FIBER ADULT GUMMIES PO Take 2 tablets by mouth 2 (two) times a week.     gabapentin (NEURONTIN) 100 MG capsule Take 100-200 mg by mouth See admin instructions. Take 1 capsule (100 mg) by mouth in the morning & take 2 capsules (200 mg) by mouth at bedtime.     hydrochlorothiazide (HYDRODIURIL) 25 MG tablet Take 50 mg by mouth in the morning.     loratadine (CLARITIN) 10 MG tablet Take 10 mg by mouth daily.     montelukast (SINGULAIR) 10 MG tablet Take 10 mg  by mouth at bedtime.     Multiple Vitamins-Minerals (PRESERVISION AREDS 2 PO) Take 1 tablet by mouth in the morning and at bedtime.     OZEMPIC, 1 MG/DOSE, 4 MG/3ML SOPN Inject 1 mg into the skin every Saturday.     Polyethyl Glycol-Propyl Glycol (SYSTANE ULTRA PF OP) Place 1 drop into both eyes in the morning and at bedtime.      polyethylene glycol (MIRALAX / GLYCOLAX) 17 g packet Take 17 g by mouth in the morning.     polyethylene glycol-electrolytes (NULYTELY) 420 g solution See admin instructions.     rosuvastatin (CRESTOR) 5 MG tablet Take 5 mg by mouth every evening.     simethicone (MYLICON) 80 MG chewable tablet Chew 80 mg by mouth daily as needed (gas).     traMADol (ULTRAM) 50 MG tablet Take 1-2 tablets (50-100 mg total) by mouth every 6 (six) hours as needed (pain). 15 tablet 0   No current facility-administered medications for this visit.    ALLERGIES: Amitriptyline hcl, Bystolic [nebivolol hcl], Escitalopram, Hydroxychloroquine, Latex, Metformin, Oxycodone, Prednisone, Adhesive [tape], Aspirin, Empagliflozin, Glimepiride, Glipizide, Hydrocodone, Other, Tramadol, Cephalosporins, Chlorhexidine, Ciprofloxacin, Codeine, and Penicillins  Family History  Adopted: Yes  Problem Relation Age of Onset   Hypertension Mother    Thyroid disease Mother    Diabetes Maternal Grandmother    Hypertension Maternal Grandmother    Heart attack Maternal Grandmother    Hypertension Maternal  Grandfather    Stroke Maternal Grandfather    Stroke Maternal Uncle    Stroke Maternal Uncle    Heart attack Maternal Uncle    Heart attack Maternal Uncle    Lung cancer Maternal Uncle     Review of Systems  All other systems reviewed and are negative.   PHYSICAL EXAM:  BP 116/86 (BP Location: Left Arm, Patient Position: Sitting, Cuff Size: Normal)   Pulse 80   Ht 5' 1.25" (1.556 m)   Wt 169 lb (76.7 kg)   LMP 12/29/1976 (Approximate)   SpO2 94%   BMI 31.67 kg/m     General appearance: alert, cooperative and appears stated age Head: normocephalic, without obvious abnormality, atraumatic Neck: no adenopathy, supple, symmetrical, trachea midline and thyroid normal to inspection and palpation Lungs: clear to auscultation bilaterally Breasts: right - normal appearance, bilobed mass at 9:00 at the edge of the areola which is 4 mm and 4 mm, no tenderness, No nipple retraction or dimpling, No nipple discharge or bleeding, No axillary adenopathy Left - normal appearance, no masses or tenderness, No nipple retraction or dimpling, No nipple discharge or bleeding, No axillary adenopathy Heart: regular rate and rhythm Abdomen: soft, non-tender; no masses, no organomegaly Extremities: extremities normal, atraumatic, no cyanosis or edema Skin: skin color, texture, turgor normal. No rashes or lesions Lymph nodes: cervical, supraclavicular, and axillary nodes normal. Neurologic: grossly normal  Pelvic: External genitalia:  sebaceous cyst right labia majora.               No abnormal inguinal nodes palpated.              Urethra:  normal appearing urethra with no masses, tenderness or lesions              Bartholins and Skenes: normal                 Vagina: normal appearing vagina with normal color and discharge, no lesions              Cervix: absent  Pap taken: No. Bimanual Exam:  Uterus:  absent              Adnexa: no mass, fullness, tenderness              Rectal exam:  yes.  Confirms.              Anus:  normal sphincter tone, no lesions  Chaperone was present for exam:  Warren Lacy, CMA  ASSESSMENT: Encounter for breast and pelvic exam.  Right breast masses, overlapping quadrants, 9:00.  Status post TAH.  Status post laparoscopic BSO with robotic approach.  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 Osteopenia.  Increased risk of hip fracture.  Followed by PCP. Depression and anxiety.  On Cymbalta. Right breast lumps.  DM. Renal stones. ***  PLAN: Proceed with dx right mammogram and right breast US at [facility].   If the imaging is normal, will refer to general surgeon.  Patient is in agreement.  Self breast awareness reviewed. Pap and HRV collected:  {yes no:314532} Guidelines for Calcium, Vitamin D, regular exercise program including cardiovascular and weight bearing exercise. Medication refills:  *** {LABS (Optional):23779} Follow up:  ***    Additional counseling given.  {yes [id number]. ***  total time was spent for this patient encounter, including preparation, face-to-face counseling with the patient, coordination of care, and documentation of the encounter in addition to doing the breast and pelvic exam.

## 2024-02-19 DIAGNOSIS — G4733 Obstructive sleep apnea (adult) (pediatric): Secondary | ICD-10-CM | POA: Diagnosis not present

## 2024-02-29 ENCOUNTER — Ambulatory Visit (INDEPENDENT_AMBULATORY_CARE_PROVIDER_SITE_OTHER): Payer: Medicare PPO | Admitting: Obstetrics and Gynecology

## 2024-02-29 ENCOUNTER — Encounter: Payer: Self-pay | Admitting: Obstetrics and Gynecology

## 2024-02-29 VITALS — BP 116/86 | HR 80 | Ht 61.25 in | Wt 169.0 lb

## 2024-02-29 DIAGNOSIS — N6315 Unspecified lump in the right breast, overlapping quadrants: Secondary | ICD-10-CM | POA: Diagnosis not present

## 2024-02-29 DIAGNOSIS — Z01419 Encounter for gynecological examination (general) (routine) without abnormal findings: Secondary | ICD-10-CM

## 2024-02-29 DIAGNOSIS — M8580 Other specified disorders of bone density and structure, unspecified site: Secondary | ICD-10-CM | POA: Diagnosis not present

## 2024-02-29 DIAGNOSIS — N907 Vulvar cyst: Secondary | ICD-10-CM | POA: Diagnosis not present

## 2024-03-02 ENCOUNTER — Telehealth: Payer: Self-pay | Admitting: Obstetrics and Gynecology

## 2024-03-02 DIAGNOSIS — N6315 Unspecified lump in the right breast, overlapping quadrants: Secondary | ICD-10-CM

## 2024-03-02 NOTE — Patient Instructions (Signed)

## 2024-03-02 NOTE — Telephone Encounter (Signed)
 Please schedule diagnostic right mamm[facility] and [facility].   My patient has right breast bilobed mass at 9:00 at the edge of the areola which is 4 mm and 4 mm, each adjacent to each other.   These are palpable on examination.

## 2024-03-03 DIAGNOSIS — H43811 Vitreous degeneration, right eye: Secondary | ICD-10-CM | POA: Diagnosis not present

## 2024-03-03 DIAGNOSIS — H353131 Nonexudative age-related macular degeneration, bilateral, early dry stage: Secondary | ICD-10-CM | POA: Diagnosis not present

## 2024-03-03 DIAGNOSIS — E119 Type 2 diabetes mellitus without complications: Secondary | ICD-10-CM | POA: Diagnosis not present

## 2024-03-03 DIAGNOSIS — Z961 Presence of intraocular lens: Secondary | ICD-10-CM | POA: Diagnosis not present

## 2024-03-03 NOTE — Telephone Encounter (Signed)
 Spoke with Ebony at [facility], patient scheduled for right breast Dx MMG and [place] on 04/06/24 at 1240.   Spoke with patient, advised as seen above. Patient verbalizes understanding and is agreeable to date/time.   Encounter closed.

## 2024-03-15 DIAGNOSIS — K2282 Esophagogastric junction polyp: Secondary | ICD-10-CM | POA: Diagnosis not present

## 2024-03-15 DIAGNOSIS — D130 Benign neoplasm of esophagus: Secondary | ICD-10-CM | POA: Diagnosis not present

## 2024-03-15 DIAGNOSIS — D123 Benign neoplasm of transverse colon: Secondary | ICD-10-CM | POA: Diagnosis not present

## 2024-03-15 DIAGNOSIS — Z1211 Encounter for screening for malignant neoplasm of colon: Secondary | ICD-10-CM | POA: Diagnosis not present

## 2024-03-15 DIAGNOSIS — K317 Polyp of stomach and duodenum: Secondary | ICD-10-CM | POA: Diagnosis not present

## 2024-03-15 DIAGNOSIS — K224 Dyskinesia of esophagus: Secondary | ICD-10-CM | POA: Diagnosis not present

## 2024-03-15 DIAGNOSIS — K2930 Chronic superficial gastritis without bleeding: Secondary | ICD-10-CM | POA: Diagnosis not present

## 2024-03-15 DIAGNOSIS — R1310 Dysphagia, unspecified: Secondary | ICD-10-CM | POA: Diagnosis not present

## 2024-03-15 DIAGNOSIS — K635 Polyp of colon: Secondary | ICD-10-CM | POA: Diagnosis not present

## 2024-03-17 DIAGNOSIS — K317 Polyp of stomach and duodenum: Secondary | ICD-10-CM | POA: Diagnosis not present

## 2024-03-17 DIAGNOSIS — K2930 Chronic superficial gastritis without bleeding: Secondary | ICD-10-CM | POA: Diagnosis not present

## 2024-03-17 DIAGNOSIS — D123 Benign neoplasm of transverse colon: Secondary | ICD-10-CM | POA: Diagnosis not present

## 2024-03-17 DIAGNOSIS — K635 Polyp of colon: Secondary | ICD-10-CM | POA: Diagnosis not present

## 2024-03-17 DIAGNOSIS — D130 Benign neoplasm of esophagus: Secondary | ICD-10-CM | POA: Diagnosis not present

## 2024-03-18 DIAGNOSIS — G4733 Obstructive sleep apnea (adult) (pediatric): Secondary | ICD-10-CM | POA: Diagnosis not present

## 2024-03-28 DIAGNOSIS — G4733 Obstructive sleep apnea (adult) (pediatric): Secondary | ICD-10-CM | POA: Diagnosis not present

## 2024-04-06 ENCOUNTER — Ambulatory Visit
Admission: RE | Admit: 2024-04-06 | Discharge: 2024-04-06 | Disposition: A | Discharge status: Home / Self Care | Source: Ambulatory Visit | Attending: Obstetrics and Gynecology | Admitting: Obstetrics and Gynecology

## 2024-04-06 DIAGNOSIS — N6315 Unspecified lump in the right breast, overlapping quadrants: Secondary | ICD-10-CM

## 2024-04-06 DIAGNOSIS — N6001 Solitary cyst of right breast: Secondary | ICD-10-CM | POA: Diagnosis not present

## 2024-04-07 ENCOUNTER — Encounter: Payer: Self-pay | Admitting: Obstetrics and Gynecology

## 2024-04-18 DIAGNOSIS — G4733 Obstructive sleep apnea (adult) (pediatric): Secondary | ICD-10-CM | POA: Diagnosis not present

## 2024-05-10 DIAGNOSIS — L570 Actinic keratosis: Secondary | ICD-10-CM | POA: Diagnosis not present

## 2024-05-10 DIAGNOSIS — L821 Other seborrheic keratosis: Secondary | ICD-10-CM | POA: Diagnosis not present

## 2024-05-10 DIAGNOSIS — L820 Inflamed seborrheic keratosis: Secondary | ICD-10-CM | POA: Diagnosis not present

## 2024-05-10 DIAGNOSIS — L738 Other specified follicular disorders: Secondary | ICD-10-CM | POA: Diagnosis not present

## 2024-05-10 DIAGNOSIS — D225 Melanocytic nevi of trunk: Secondary | ICD-10-CM | POA: Diagnosis not present

## 2024-05-11 DIAGNOSIS — M3500 Sicca syndrome, unspecified: Secondary | ICD-10-CM | POA: Diagnosis not present

## 2024-05-11 DIAGNOSIS — M25511 Pain in right shoulder: Secondary | ICD-10-CM | POA: Diagnosis not present

## 2024-05-11 DIAGNOSIS — M533 Sacrococcygeal disorders, not elsewhere classified: Secondary | ICD-10-CM | POA: Diagnosis not present

## 2024-05-11 DIAGNOSIS — M1990 Unspecified osteoarthritis, unspecified site: Secondary | ICD-10-CM | POA: Diagnosis not present

## 2024-05-11 DIAGNOSIS — M3589 Other specified systemic involvement of connective tissue: Secondary | ICD-10-CM | POA: Diagnosis not present

## 2024-05-11 DIAGNOSIS — M7062 Trochanteric bursitis, left hip: Secondary | ICD-10-CM | POA: Diagnosis not present

## 2024-05-11 DIAGNOSIS — R5383 Other fatigue: Secondary | ICD-10-CM | POA: Diagnosis not present

## 2024-05-18 DIAGNOSIS — G4733 Obstructive sleep apnea (adult) (pediatric): Secondary | ICD-10-CM | POA: Diagnosis not present

## 2024-06-18 DIAGNOSIS — G4733 Obstructive sleep apnea (adult) (pediatric): Secondary | ICD-10-CM | POA: Diagnosis not present

## 2024-06-21 DIAGNOSIS — L245 Irritant contact dermatitis due to other chemical products: Secondary | ICD-10-CM | POA: Diagnosis not present

## 2024-06-21 DIAGNOSIS — L820 Inflamed seborrheic keratosis: Secondary | ICD-10-CM | POA: Diagnosis not present

## 2024-06-21 DIAGNOSIS — L821 Other seborrheic keratosis: Secondary | ICD-10-CM | POA: Diagnosis not present

## 2024-06-22 DIAGNOSIS — M7062 Trochanteric bursitis, left hip: Secondary | ICD-10-CM | POA: Diagnosis not present

## 2024-06-22 DIAGNOSIS — M1990 Unspecified osteoarthritis, unspecified site: Secondary | ICD-10-CM | POA: Diagnosis not present

## 2024-06-22 DIAGNOSIS — M533 Sacrococcygeal disorders, not elsewhere classified: Secondary | ICD-10-CM | POA: Diagnosis not present

## 2024-06-22 DIAGNOSIS — M3589 Other specified systemic involvement of connective tissue: Secondary | ICD-10-CM | POA: Diagnosis not present

## 2024-06-22 DIAGNOSIS — R5383 Other fatigue: Secondary | ICD-10-CM | POA: Diagnosis not present

## 2024-06-22 DIAGNOSIS — M3500 Sicca syndrome, unspecified: Secondary | ICD-10-CM | POA: Diagnosis not present

## 2024-06-22 DIAGNOSIS — M545 Low back pain, unspecified: Secondary | ICD-10-CM | POA: Diagnosis not present

## 2024-06-28 DIAGNOSIS — M545 Low back pain, unspecified: Secondary | ICD-10-CM | POA: Diagnosis not present

## 2024-07-05 DIAGNOSIS — M545 Low back pain, unspecified: Secondary | ICD-10-CM | POA: Diagnosis not present

## 2024-07-05 DIAGNOSIS — E1165 Type 2 diabetes mellitus with hyperglycemia: Secondary | ICD-10-CM | POA: Diagnosis not present

## 2024-07-06 DIAGNOSIS — G4733 Obstructive sleep apnea (adult) (pediatric): Secondary | ICD-10-CM | POA: Diagnosis not present

## 2024-07-12 ENCOUNTER — Other Ambulatory Visit: Payer: Self-pay | Admitting: Obstetrics and Gynecology

## 2024-07-12 DIAGNOSIS — E1165 Type 2 diabetes mellitus with hyperglycemia: Secondary | ICD-10-CM | POA: Diagnosis not present

## 2024-07-12 DIAGNOSIS — Z0000 Encounter for general adult medical examination without abnormal findings: Secondary | ICD-10-CM

## 2024-07-12 DIAGNOSIS — E785 Hyperlipidemia, unspecified: Secondary | ICD-10-CM | POA: Diagnosis not present

## 2024-07-12 DIAGNOSIS — Z1231 Encounter for screening mammogram for malignant neoplasm of breast: Secondary | ICD-10-CM

## 2024-07-12 DIAGNOSIS — I10 Essential (primary) hypertension: Secondary | ICD-10-CM | POA: Diagnosis not present

## 2024-07-13 DIAGNOSIS — M545 Low back pain, unspecified: Secondary | ICD-10-CM | POA: Diagnosis not present

## 2024-07-15 ENCOUNTER — Encounter: Payer: Self-pay | Admitting: Advanced Practice Midwife

## 2024-07-27 ENCOUNTER — Other Ambulatory Visit: Payer: Self-pay | Admitting: Medical Genetics

## 2024-07-28 DIAGNOSIS — M545 Low back pain, unspecified: Secondary | ICD-10-CM | POA: Diagnosis not present

## 2024-07-29 ENCOUNTER — Other Ambulatory Visit
Admission: RE | Admit: 2024-07-29 | Discharge: 2024-07-29 | Disposition: A | Discharge status: Home / Self Care | Payer: Self-pay | Source: Ambulatory Visit | Attending: Medical Genetics | Admitting: Medical Genetics

## 2024-08-03 ENCOUNTER — Ambulatory Visit
Admission: RE | Admit: 2024-08-03 | Discharge: 2024-08-03 | Disposition: A | Discharge status: Home / Self Care | Source: Ambulatory Visit | Attending: Obstetrics and Gynecology | Admitting: Obstetrics and Gynecology

## 2024-08-03 DIAGNOSIS — Z1231 Encounter for screening mammogram for malignant neoplasm of breast: Secondary | ICD-10-CM

## 2024-08-04 DIAGNOSIS — M545 Low back pain, unspecified: Secondary | ICD-10-CM | POA: Diagnosis not present

## 2024-08-09 DIAGNOSIS — M545 Low back pain, unspecified: Secondary | ICD-10-CM | POA: Diagnosis not present

## 2024-08-09 LAB — GENECONNECT MOLECULAR SCREEN: Genetic Analysis Overall Interpretation: NEGATIVE

## 2024-08-16 DIAGNOSIS — M545 Low back pain, unspecified: Secondary | ICD-10-CM | POA: Diagnosis not present

## 2024-08-22 DIAGNOSIS — Z1331 Encounter for screening for depression: Secondary | ICD-10-CM | POA: Diagnosis not present

## 2024-08-22 DIAGNOSIS — R251 Tremor, unspecified: Secondary | ICD-10-CM | POA: Diagnosis not present

## 2024-08-22 DIAGNOSIS — E6609 Other obesity due to excess calories: Secondary | ICD-10-CM | POA: Diagnosis not present

## 2024-08-22 DIAGNOSIS — G4733 Obstructive sleep apnea (adult) (pediatric): Secondary | ICD-10-CM | POA: Diagnosis not present

## 2024-08-22 DIAGNOSIS — E7849 Other hyperlipidemia: Secondary | ICD-10-CM | POA: Diagnosis not present

## 2024-08-22 DIAGNOSIS — I10 Essential (primary) hypertension: Secondary | ICD-10-CM | POA: Diagnosis not present

## 2024-08-22 DIAGNOSIS — Z6832 Body mass index (BMI) 32.0-32.9, adult: Secondary | ICD-10-CM | POA: Diagnosis not present

## 2024-08-22 DIAGNOSIS — E1165 Type 2 diabetes mellitus with hyperglycemia: Secondary | ICD-10-CM | POA: Diagnosis not present

## 2024-08-22 DIAGNOSIS — E559 Vitamin D deficiency, unspecified: Secondary | ICD-10-CM | POA: Diagnosis not present

## 2024-08-22 DIAGNOSIS — R0789 Other chest pain: Secondary | ICD-10-CM | POA: Diagnosis not present

## 2024-08-22 DIAGNOSIS — E11610 Type 2 diabetes mellitus with diabetic neuropathic arthropathy: Secondary | ICD-10-CM | POA: Diagnosis not present

## 2024-08-23 ENCOUNTER — Ambulatory Visit
Admission: RE | Admit: 2024-08-23 | Discharge: 2024-08-23 | Disposition: A | Discharge status: Home / Self Care | Source: Ambulatory Visit | Attending: Obstetrics and Gynecology | Admitting: Obstetrics and Gynecology

## 2024-08-23 DIAGNOSIS — Z1231 Encounter for screening mammogram for malignant neoplasm of breast: Secondary | ICD-10-CM | POA: Diagnosis not present

## 2024-08-24 DIAGNOSIS — M545 Low back pain, unspecified: Secondary | ICD-10-CM | POA: Diagnosis not present

## 2024-08-30 DIAGNOSIS — M545 Low back pain, unspecified: Secondary | ICD-10-CM | POA: Diagnosis not present

## 2024-09-06 DIAGNOSIS — M545 Low back pain, unspecified: Secondary | ICD-10-CM | POA: Diagnosis not present

## 2024-09-07 DIAGNOSIS — N200 Calculus of kidney: Secondary | ICD-10-CM | POA: Diagnosis not present

## 2024-09-14 DIAGNOSIS — M545 Low back pain, unspecified: Secondary | ICD-10-CM | POA: Diagnosis not present

## 2024-09-15 ENCOUNTER — Ambulatory Visit: Admission: EM | Admit: 2024-09-15 | Discharge: 2024-09-15 | Disposition: A | Discharge status: Home / Self Care

## 2024-09-15 ENCOUNTER — Encounter: Payer: Self-pay | Admitting: Emergency Medicine

## 2024-09-15 DIAGNOSIS — R21 Rash and other nonspecific skin eruption: Secondary | ICD-10-CM

## 2024-09-15 MED ORDER — TRIAMCINOLONE ACETONIDE 0.1 % EX OINT: 1.0000 | TOPICAL_OINTMENT | Freq: Two times a day (BID) | CUTANEOUS | 0 refills | Status: DC

## 2024-09-15 NOTE — ED Triage Notes (Signed)
 Patient reports red rash to bilateral legs. Patient denies itching but says it feels [place]. Patient used cold compress on them last night with no improvement.

## 2024-09-15 NOTE — Discharge Instructions (Addendum)
  1. Skin rash (Primary) - triamcinolone  ointment (KENALOG ) 0.1 %; Apply 1 Application topically 2 (two) times daily.  Dispense: 30 g; Refill: 0 - Continue to monitor symptoms for any changes severity if there is no resolution or symptoms become worse follow-up with dermatology for further evaluation and treatment. -If symptoms become progressively worse follow-up in ER immediately for further evaluation and treatment.

## 2024-09-15 NOTE — ED Provider Notes (Signed)
 [facility]  Note:  This document was prepared using Conservation officer, historic buildings and may include unintentional dictation errors.  MRN: [id number] DOB: March 07, 1946  Subjective:   Victoria Andrade is a 78 y.o. female presenting for erythematous rash to bilateral legs with mild tingling x 1 day.  Patient denies any known exposure to any allergic substances or chemicals.  Patient used cold compresses last night with minimal to no improvement.  Patient denies any severe itching, pain, spread of rash to other areas, warmth to the area, or drainage.  Patient has not used any over-the-counter medication or ointment to treat symptoms.  No current facility-administered medications for this encounter.  Current Outpatient Medications:    triamcinolone  ointment (KENALOG ) 0.1 %, Apply 1 Application topically 2 (two) times daily., Disp: 30 g, Rfl: 0   ACCU-CHEK GUIDE test strip, , Disp: , Rfl:    Accu-Chek Softclix Lancets lancets, , Disp: , Rfl:    ALPRAZolam  (XANAX ) 1 MG tablet, Take 0.5-1.5 mg by mouth See admin instructions. Take 1.5 tablets (1.5 mg) by mouth scheduled every night & may take 0.5 tablet (0.5 mg) by mouth daily if needed for anxiety., Disp: , Rfl:    Blood Glucose Monitoring Suppl (ACCU-CHEK GUIDE) w/Device KIT, , Disp: , Rfl:    Cholecalciferol (VITAMIN D3) 5000 UNITS CAPS, Take 5,000 Units by mouth in the morning., Disp: , Rfl:    diphenhydrAMINE  (BENADRYL ) 25 MG tablet, Take 25 mg by mouth at bedtime., Disp: , Rfl:    docusate sodium  (COLACE) 100 MG capsule, Take 100 mg by mouth 2 (two) times daily as needed (constipation.)., Disp: , Rfl:    DULoxetine  (CYMBALTA ) 60 MG capsule, Take 60 mg by mouth at bedtime., Disp: , Rfl:    ezetimibe (ZETIA) 10 MG tablet, Take 10 mg by mouth at bedtime., Disp: , Rfl:    famotidine  (PEPCID ) 20 MG tablet, Take 20 mg by mouth daily as needed for heartburn or indigestion. , Disp: , Rfl:    FIBER ADULT GUMMIES PO, Take 2 tablets by mouth  2 (two) times a week., Disp: , Rfl:    gabapentin (NEURONTIN) 100 MG capsule, Take 100-200 mg by mouth See admin instructions. Take 1 capsule (100 mg) by mouth in the morning & take 2 capsules (200 mg) by mouth at bedtime., Disp: , Rfl:    hydrochlorothiazide  (HYDRODIURIL ) 25 MG tablet, Take 50 mg by mouth in the morning., Disp: , Rfl:    loratadine  (CLARITIN ) 10 MG tablet, Take 10 mg by mouth daily., Disp: , Rfl:    montelukast  (SINGULAIR ) 10 MG tablet, Take 10 mg by mouth at bedtime., Disp: , Rfl:    Multiple Vitamins-Minerals (PRESERVISION AREDS 2 PO), Take 1 tablet by mouth in the morning and at bedtime., Disp: , Rfl:    OZEMPIC, 1 MG/DOSE, 4 MG/3ML SOPN, Inject 1 mg into the skin every Saturday., Disp: , Rfl:    Polyethyl Glycol-Propyl Glycol (SYSTANE ULTRA PF OP), Place 1 drop into both eyes in the morning and at bedtime. , Disp: , Rfl:    polyethylene glycol (MIRALAX / GLYCOLAX) 17 g packet, Take 17 g by mouth in the morning., Disp: , Rfl:    polyethylene glycol-electrolytes (NULYTELY) 420 g solution, See admin instructions., Disp: , Rfl:    rosuvastatin (CRESTOR) 5 MG tablet, Take 5 mg by mouth every evening., Disp: , Rfl:    simethicone  (MYLICON) 80 MG chewable tablet, Chew 80 mg by mouth daily as needed (gas)., Disp: , Rfl:  traMADol  (ULTRAM ) 50 MG tablet, Take 1-2 tablets (50-100 mg total) by mouth every 6 (six) hours as needed (pain)., Disp: 15 tablet, Rfl: 0   Allergies  Allergen Reactions   Amitriptyline Hcl Anaphylaxis and Palpitations    Heart races   Bystolic [Nebivolol Hcl] Shortness Of Breath and Rash    Any heart medications   Escitalopram Shortness Of Breath and Other (See Comments)   Hydroxychloroquine Palpitations, Rash and Shortness Of Breath   Latex Shortness Of Breath, Swelling and Other (See Comments)    blister    Metformin Anaphylaxis and Other (See Comments)    Unknown reaction    Oxycodone Palpitations    Heart races   Prednisone Swelling    Heart  racing   Adhesive [Tape]     Blisters    Aspirin Other (See Comments)    Upsets ibs and acid reflux   Empagliflozin Other (See Comments)   Glimepiride Nausea Only and Other (See Comments)    Caused tremors   Glipizide  Nausea Only and Other (See Comments)    Gi upset and tremors   Hydrocodone  Itching and Other (See Comments)    Feet burning and itching.    Other     Pt states she is allergic to most antibiotics and can only take Zithromax and Macrobid    Tramadol  Itching    Able to take infrequently   Cephalosporins Swelling, Rash and Other (See Comments)   Chlorhexidine  Rash   Ciprofloxacin Swelling, Rash and Other (See Comments)   Codeine Palpitations    Heart races    Penicillins Swelling, Rash and Other (See Comments)    Did it involve swelling of the face/tongue/throat, SOB, or low BP? Yes Did it involve sudden or severe rash/hives, skin peeling, or any reaction on the inside of your mouth or nose? No Did you need to seek medical attention at a hospital or doctor's office? Yes When did it last happen?      20 + years If all above answers are NO, may proceed with cephalosporin use.     Past Medical History:  Diagnosis Date   Anxiety    takes xanax    Arthritis    fingers and neck   Cataract immature    Diabetes mellitus without complication (HCC)    Elevated hemoglobin A1c 02/2015   level 6.2   Family history of adverse reaction to anesthesia    uncle had a stroke following anesthesia   Fatty liver    Fibromyalgia    GERD (gastroesophageal reflux disease)    History of bronchitis    History of hiatal hernia    History of kidney stones    Hypertension    IBS (irritable bowel syndrome)    Imbalance    Macular degeneration    Melanoma (HCC)    left eye   Migraine    Neuropathy    Osteopenia 2016   PONV (postoperative nausea and vomiting)    Benadryl  help with N/V since she can no longer have SCOP; difficulty to awaken   Shingles 2021   Sjogren syndrome  [facility])      Past Surgical History:  Procedure Laterality Date   ABDOMINAL HYSTERECTOMY  1978   partial   BILATERAL SALPINGOOPHORECTOMY  2010   with robot   BREAST BIOPSY Right 03/06/2008   benign   CATARACT EXTRACTION, BILATERAL  2018   CHOLECYSTECTOMY  2005   lap   COLONOSCOPY     CYSTOSCOPY N/A 03/13/2015   Procedure: CYSTOSCOPY;  Surgeon: Bobie FORBES Cathlyn JAYSON Nikki, MD;  Location: [facility];  Service: Gynecology;  Laterality: N/A;   CYSTOSCOPY WITH RETROGRADE PYELOGRAM, URETEROSCOPY AND STENT PLACEMENT Left 12/17/2020   Procedure: CYSTOSCOPY WITH LEFT RETROGRADE PYELOGRAM, URETEROSCOPY HOLMIUM LASER AND STENT PLACEMENT;  Surgeon: Renda Glance, MD;  Location: [facility];  Service: Urology;  Laterality: Left;  REQUESTING 2 HRS   CYSTOSCOPY/URETEROSCOPY/HOLMIUM LASER/STENT PLACEMENT Left 10/29/2020   Procedure: CYSTOSCOPY/RETROGRADE/URETEROSCOPY/HOLMIUM LASER/STENT PLACEMENT;  Surgeon: Renda Glance, MD;  Location: [facility];  Service: Urology;  Laterality: Left;   CYSTOSCOPY/URETEROSCOPY/HOLMIUM LASER/STENT PLACEMENT Left 01/21/2021   Procedure: CYSTOSCOPY/RETROGRADE/URETEROSCOPY/HOLMIUM LASER/STENT PLACEMENT;  Surgeon: Renda Glance, MD;  Location: [facility];  Service: Urology;  Laterality: Left;   CYSTOSCOPY/URETEROSCOPY/HOLMIUM LASER/STENT PLACEMENT Right 03/27/2022   Procedure: CYSTOSCOPY/RETROGRADE/URETEROSCOPY/HOLMIUM LASER/STENT PLACEMENT;  Surgeon: Renda Glance, MD;  Location: [facility];  Service: Urology;  Laterality: Right;   CYSTOSCOPY/URETEROSCOPY/HOLMIUM LASER/STENT PLACEMENT Bilateral 10/29/2023   Procedure: CYSTOSCOPY/BILATERAL RETROGRADE PYELOGRAM/URETEROSCOPY/URETERAL STENT PLACEMENT/HOLMIUM LASER;  Surgeon: Renda Glance, MD;  Location: [facility];  Service: Urology;  Laterality: Bilateral;  90 MINUTES NEEDED FOR CASE   IR NEPHRO TUBE REMOV/FL  03/20/2020   IR NEPHROSTOGRAM RIGHT THRU EXISTING ACCESS  03/20/2020   IR URETERAL STENT RIGHT NEW ACCESS W/O SEP NEPHROSTOMY CATH  03/15/2020    LITHOTRIPSY     20 years ago   NEPHROLITHOTOMY Right 03/15/2020   Procedure: NEPHROLITHOTOMY PERCUTANEOUS/ INTERVENTIONAL RADIOLOCGY TO PLACE POSTERIOR CALYX PERCUTANEOUS ACCESS PRIOR;  Surgeon: Renda Glance, MD;  Location: [facility];  Service: Urology;  Laterality: Right;  NEEDS 150 MIN FOR PROCEDURE   ROBOTIC ASSISTED SALPINGO OOPHERECTOMY Left 03/13/2015   Procedure: ROBOTIC ASSISTED INCISION OF  LEFT PELVIC MASS, LYSIS OF ADHESIONS  WITH PELVIC WASHINGS;  Surgeon: Bobie FORBES Cathlyn JAYSON Nikki, MD;  Location: [facility];  Service: Gynecology;  Laterality: Left;   SHOULDER SURGERY Left    bone spurs   TONSILLECTOMY     as child   TUBAL LIGATION  1976   UPPER GI ENDOSCOPY      Family History  Adopted: Yes  Problem Relation Age of Onset   Hypertension Mother    Thyroid  disease Mother    Diabetes Maternal Grandmother    Hypertension Maternal Grandmother    Heart attack Maternal Grandmother    Hypertension Maternal Grandfather    Stroke Maternal Grandfather    Stroke Maternal Uncle    Stroke Maternal Uncle    Heart attack Maternal Uncle    Heart attack Maternal Uncle    Lung cancer Maternal Uncle     Social History   Tobacco Use   Smoking status: Never   Smokeless tobacco: Never  Vaping Use   Vaping status: Never Used  Substance Use Topics   Alcohol use: Yes    Comment: rarely   Drug use: Never    ROS Refer to HPI for ROS details.  Objective:   Vitals: BP 114/74 (BP Location: Right Arm)   Pulse 91   Temp 97.7 F (36.5 C) (Oral)   Resp 18   LMP 12/29/1976 (Approximate)   SpO2 97%   Physical Exam Vitals and nursing note reviewed.  Constitutional:      General: She is not in acute distress.    Appearance: Normal appearance. She is not ill-appearing.  HENT:     Head: Normocephalic.  Cardiovascular:     Rate and Rhythm: Normal rate.  Pulmonary:     Effort: Pulmonary effort is normal. No respiratory distress.  Skin:    General: Skin is warm and  dry.     Capillary  Refill: Capillary refill takes less than 2 seconds.     Findings: Erythema present.  Neurological:     General: No focal deficit present.     Mental Status: She is alert and oriented to person, place, and time.  Psychiatric:        Mood and Affect: Mood normal.        Behavior: Behavior normal.     Procedures  No results found for this or any previous visit (from the past 24 hours).  No results found.   Assessment and Plan :     Discharge Instructions       1. Skin rash (Primary) - triamcinolone  ointment (KENALOG ) 0.1 %; Apply 1 Application topically 2 (two) times daily.  Dispense: 30 g; Refill: 0 - Continue to monitor symptoms for any changes severity if there is no resolution or symptoms become worse follow-up with dermatology for further evaluation and treatment. -If symptoms become progressively worse follow-up in ER immediately for further evaluation and treatment.      Shamiya Demeritt B Jarelis Ehlert   Latroya Ng B, NP 09/15/24 1300

## 2024-09-16 ENCOUNTER — Ambulatory Visit: Payer: Self-pay

## 2024-09-21 DIAGNOSIS — G629 Polyneuropathy, unspecified: Secondary | ICD-10-CM | POA: Diagnosis not present

## 2024-09-21 DIAGNOSIS — M1990 Unspecified osteoarthritis, unspecified site: Secondary | ICD-10-CM | POA: Diagnosis not present

## 2024-09-21 DIAGNOSIS — M3500 Sicca syndrome, unspecified: Secondary | ICD-10-CM | POA: Diagnosis not present

## 2024-09-21 DIAGNOSIS — M359 Systemic involvement of connective tissue, unspecified: Secondary | ICD-10-CM | POA: Diagnosis not present

## 2024-10-07 ENCOUNTER — Ambulatory Visit
Admission: RE | Admit: 2024-10-07 | Discharge: 2024-10-07 | Disposition: A | Discharge status: Home / Self Care | Source: Ambulatory Visit | Attending: Emergency Medicine | Admitting: Emergency Medicine

## 2024-10-07 VITALS — BP 130/74 | HR 82 | Temp 98.2°F | Resp 18

## 2024-10-07 DIAGNOSIS — R21 Rash and other nonspecific skin eruption: Secondary | ICD-10-CM | POA: Diagnosis not present

## 2024-10-07 MED ORDER — BETAMETHASONE VALERATE 0.1 % EX LOTN: 1.0000 | TOPICAL_LOTION | Freq: Two times a day (BID) | CUTANEOUS | 0 refills | Status: DC

## 2024-10-07 MED ORDER — FAMOTIDINE 20 MG PO TABS: 20.0000 mg | ORAL_TABLET | Freq: Every day | ORAL | 0 refills | Status: DC

## 2024-10-07 NOTE — ED Triage Notes (Signed)
 Patient reports red itchy rash to anterior neck area and upper chest area that started yesterday.  Patient has not taken anything for symptoms. Denies pain.

## 2024-10-07 NOTE — Discharge Instructions (Signed)
 Today you are evaluated for your rash which appears inflammatory, no signs of infection  Use steroid cream twice daily, this is stronger than the one that you were prescribed prior  Begin use of a daily allergy medicine such as Claritin  or Zyrtec to help reduce response  Take famotidine  daily while taking allergy medicine as this covers the other allergy receptor will increase chances of a reduced inflammatory response  You may apply a topical Benadryl  cream or calamine lotion over the affected area to help with itching  If the area becomes inflamed due to heat to cool the skin down with a rag  For persisting symptoms please follow-up with your primary doctor as you may need allergy testing

## 2024-10-07 NOTE — ED Provider Notes (Signed)
 Victoria Andrade    CSN: [id number] Arrival date & time: 10/07/24  1128      History   Chief Complaint Chief Complaint  Patient presents with   Rash    woke up this morning with a large red rash around my neck . It went about half way around and had a knot in the center. I took a Benadryl  and used some Triamcinolone  ointment.   It briefly went down but is now back and has spread to my chest - Entered by patient    HPI PEGGIE Andrade is a 78 y.o. female.   Patient presents for evaluation of an erythematous rash present to the anterior neck and chest wall beginning 1 day ago.  Intermittently pruritic.  Exacerbated by heat.  Denies drainage, fever or pain.  Denies changes in toiletries diet medications or recent travel.  No known sick contact with similar symptoms.  Denies outdoor activity.  Has attempted use of triamcinolone  cream without improvement.   Past Medical History:  Diagnosis Date   Anxiety    takes xanax    Arthritis    fingers and neck   Cataract immature    Diabetes mellitus without complication (HCC)    Elevated hemoglobin A1c 02/2015   level 6.2   Family history of adverse reaction to anesthesia    uncle had a stroke following anesthesia   Fatty liver    Fibromyalgia    GERD (gastroesophageal reflux disease)    History of bronchitis    History of hiatal hernia    History of kidney stones    Hypertension    IBS (irritable bowel syndrome)    Imbalance    Macular degeneration    Melanoma (HCC)    left eye   Migraine    Neuropathy    Osteopenia 2016   PONV (postoperative nausea and vomiting)    Benadryl  help with N/V since she can no longer have SCOP; difficulty to awaken   Shingles 2021   Sjogren syndrome     Patient Active Problem List   Diagnosis Date Noted   Renal calculus, right 03/15/2020   OTHER DISEASES OF VOCAL CORDS 01/01/2009   GERD 01/01/2009   COUGH 01/01/2009    Past Surgical History:  Procedure Laterality Date   ABDOMINAL  HYSTERECTOMY  1978   partial   BILATERAL SALPINGOOPHORECTOMY  2010   with robot   BREAST BIOPSY Right 03/06/2008   benign   CATARACT EXTRACTION, BILATERAL  2018   CHOLECYSTECTOMY  2005   lap   COLONOSCOPY     CYSTOSCOPY N/A 03/13/2015   Procedure: CYSTOSCOPY;  Surgeon: Bobie FORBES Cathlyn JAYSON Nikki, MD;  Location: [facility];  Service: Gynecology;  Laterality: N/A;   CYSTOSCOPY WITH RETROGRADE PYELOGRAM, URETEROSCOPY AND STENT PLACEMENT Left 12/17/2020   Procedure: CYSTOSCOPY WITH LEFT RETROGRADE PYELOGRAM, URETEROSCOPY HOLMIUM LASER AND STENT PLACEMENT;  Surgeon: Renda Glance, MD;  Location[facility];  Service: Urology;  Laterality: Left;  REQUESTING 2 HRS   CYSTOSCOPY/URETEROSCOPY/HOLMIUM LASER/STENT PLACEMENT Left 10/29/2020   Procedure: CYSTOSCOPY/RETROGRADE/URETEROSCOPY/HOLMIUM LASER/STENT PLACEMENT;  Surgeon: Renda Glance, MD;  [facility];  Service: Urology;  Laterality: Left;   CYSTOSCOPY/URETEROSCOPY/HOLMIUM LASER/STENT PLACEMENT Left 01/21/2021   Procedure: CYSTOSCOPY/RETROGRADE/URETEROSCOPY/HOLMIUM LASER/STENT PLACEMENT;  Surgeon: Renda Glance, MD;  [facility];  Service: Urology;  Laterality: Left;   CYSTOSCOPY/URETEROSCOPY/HOLMIUM LASER/STENT PLACEMENT Right 03/27/2022   Procedure: CYSTOSCOPY/RETROGRADE/URETEROSCOPY/HOLMIUM LASER/STENT PLACEMENT;  Surgeon: Renda Glance, MD;  [facility];  Service: Urology;  Laterality: Right;   CYSTOSCOPY/URETEROSCOPY/HOLMIUM LASER/STENT PLACEMENT  Bilateral 10/29/2023   Procedure: CYSTOSCOPY/BILATERAL RETROGRADE PYELOGRAM/URETEROSCOPY/URETERAL STENT PLACEMENT/HOLMIUM LASER;  Surgeon: Renda Glance, MD;  [facility];  Service: Urology;  Laterality: Bilateral;  90 MINUTES NEEDED FOR CASE   IR NEPHRO TUBE REMOV/FL  03/20/2020   IR NEPHROSTOGRAM RIGHT THRU EXISTING ACCESS  03/20/2020   IR URETERAL STENT RIGHT NEW ACCESS W/O SEP NEPHROSTOMY CATH  03/15/2020   LITHOTRIPSY     20 years ago   NEPHROLITHOTOMY Right 03/15/2020   Procedure:  NEPHROLITHOTOMY PERCUTANEOUS/ INTERVENTIONAL RADIOLOCGY TO PLACE POSTERIOR CALYX PERCUTANEOUS ACCESS PRIOR;  Surgeon: Renda Glance, MD;  Location: [facility];  Service: Urology;  Laterality: Right;  NEEDS 150 MIN FOR PROCEDURE   ROBOTIC ASSISTED SALPINGO OOPHERECTOMY Left 03/13/2015   Procedure: ROBOTIC ASSISTED INCISION OF  LEFT PELVIC MASS, LYSIS OF ADHESIONS  WITH PELVIC WASHINGS;  Surgeon: Bobie FORBES Cathlyn JAYSON Nikki, MD;  Location: [facility];  Service: Gynecology;  Laterality: Left;   SHOULDER SURGERY Left    bone spurs   TONSILLECTOMY     as child   TUBAL LIGATION  1976   UPPER GI ENDOSCOPY      OB History     Gravida  2   Para  1   Term      Preterm      AB  1   Living  1      SAB  1   IAB      Ectopic      Multiple      Live Births               Home Medications    Prior to Admission medications   Medication Sig Start Date End Date Taking? Authorizing Provider  betamethasone valerate lotion (VALISONE) 0.1 % Apply 1 Application topically 2 (two) times daily. 10/07/24  Yes Khadeeja Elden R, NP  famotidine  (PEPCID ) 20 MG tablet Take 1 tablet (20 mg total) by mouth daily. 10/07/24  Yes Teresa Shelba SAUNDERS, NP  ACCU-CHEK GUIDE test strip  09/29/20   [provider]  Accu-Chek Softclix Lancets lancets  06/20/20   [provider]  ALPRAZolam  (XANAX ) 1 MG tablet Take 0.5-1.5 mg by mouth See admin instructions. Take 1.5 tablets (1.5 mg) by mouth scheduled every night & may take 0.5 tablet (0.5 mg) by mouth daily if needed for anxiety.    [provider]  Blood Glucose Monitoring Suppl (ACCU-CHEK GUIDE) w/Device KIT  07/05/20   [provider]  Cholecalciferol (VITAMIN D3) 5000 UNITS CAPS Take 5,000 Units by mouth in the morning.    [provider]  diphenhydrAMINE  (BENADRYL ) 25 MG tablet Take 25 mg by mouth at bedtime.    [provider]  docusate sodium  (COLACE) 100 MG capsule Take 100 mg by mouth 2 (two) times daily as  needed (constipation.).    [provider]  DULoxetine  (CYMBALTA ) 60 MG capsule Take 60 mg by mouth at bedtime.    [provider]  ezetimibe (ZETIA) 10 MG tablet Take 10 mg by mouth at bedtime.    [provider]  famotidine  (PEPCID ) 20 MG tablet Take 20 mg by mouth daily as needed for heartburn or indigestion.     [provider]  FIBER ADULT GUMMIES PO Take 2 tablets by mouth 2 (two) times a week.    [provider]  gabapentin (NEURONTIN) 100 MG capsule Take 100-200 mg by mouth See admin instructions. Take 1 capsule (100 mg) by mouth in the morning & take 2 capsules (  200 mg) by mouth at bedtime. 08/01/22   [provider]  hydrochlorothiazide  (HYDRODIURIL ) 25 MG tablet Take 50 mg by mouth in the morning.    [provider]  loratadine  (CLARITIN ) 10 MG tablet Take 10 mg by mouth daily.    [provider]  montelukast  (SINGULAIR ) 10 MG tablet Take 10 mg by mouth at bedtime.    [provider]  Multiple Vitamins-Minerals (PRESERVISION AREDS 2 PO) Take 1 tablet by mouth in the morning and at bedtime.    [provider]  OZEMPIC, 1 MG/DOSE, 4 MG/3ML SOPN Inject 1 mg into the skin every Saturday.    [provider]  Polyethyl Glycol-Propyl Glycol (SYSTANE ULTRA PF OP) Place 1 drop into both eyes in the morning and at bedtime.     [provider]  polyethylene glycol (MIRALAX / GLYCOLAX) 17 g packet Take 17 g by mouth in the morning.    [provider]  polyethylene glycol-electrolytes (NULYTELY) 420 g solution See admin instructions. 02/10/24   [provider]  rosuvastatin (CRESTOR) 5 MG tablet Take 5 mg by mouth every evening. 09/02/22   [provider]  simethicone  (MYLICON) 80 MG chewable tablet Chew 80 mg by mouth daily as needed (gas).    [provider]  traMADol  (ULTRAM ) 50 MG tablet Take 1-2 tablets (50-100 mg total) by mouth every 6 (six) hours as needed  (pain). 03/27/22   Renda Glance, MD  triamcinolone  ointment (KENALOG ) 0.1 % Apply 1 Application topically 2 (two) times daily. 09/15/24   Aurea Ethel NOVAK, NP    Family History Family History  Adopted: Yes  Problem Relation Age of Onset   Hypertension Mother    Thyroid  disease Mother    Diabetes Maternal Grandmother    Hypertension Maternal Grandmother    Heart attack Maternal Grandmother    Hypertension Maternal Grandfather    Stroke Maternal Grandfather    Stroke Maternal Uncle    Stroke Maternal Uncle    Heart attack Maternal Uncle    Heart attack Maternal Uncle    Lung cancer Maternal Uncle     Social History Social History   Tobacco Use   Smoking status: Never   Smokeless tobacco: Never  Vaping Use   Vaping status: Never Used  Substance Use Topics   Alcohol use: Yes    Comment: rarely   Drug use: Never     Allergies   Amitriptyline hcl, Bystolic [nebivolol hcl], Escitalopram, Hydroxychloroquine, Latex, Metformin, Oxycodone, Prednisone, Adhesive [tape], Aspirin, Empagliflozin, Glimepiride, Glipizide , Hydrocodone , Other, Tramadol , Cephalosporins, Chlorhexidine , Ciprofloxacin, Codeine, and Penicillins   Review of Systems Review of Systems  Skin:  Positive for rash.     Physical Exam Triage Vital Signs ED Triage Vitals  Encounter Vitals Group     BP 10/07/24 1135 130/74     Girls Systolic BP Percentile --      Girls Diastolic BP Percentile --      Boys Systolic BP Percentile --      Boys Diastolic BP Percentile --      Pulse Rate 10/07/24 1135 82     Resp 10/07/24 1135 18     Temp 10/07/24 1135 98.2 F (36.8 C)     Temp Source 10/07/24 1135 Oral     SpO2 10/07/24 1135 96 %     Weight --      Height --      Head Circumference --      Peak Flow --  Pain Score 10/07/24 1138 0     Pain Loc --      Pain Education --      Exclude from Growth Chart --    No data found.  Updated Vital Signs BP 130/74 (BP Location: Left Arm)   Pulse 82    Temp 98.2 F (36.8 C) (Oral)   Resp 18   LMP 12/29/1976 (Approximate)   SpO2 96%   Visual Acuity Right Eye Distance:   Left Eye Distance:   Bilateral Distance:    Right Eye Near:   Left Eye Near:    Bilateral Near:     Physical Exam Constitutional:      Appearance: Normal appearance.  Eyes:     Extraocular Movements: Extraocular movements intact.  Skin:    Comments: Erythematous macular rash present to the anterior neck and chest wall  Neurological:     Mental Status: She is alert and oriented to person, place, and time.      UC Treatments / Results  Labs (all labs ordered are listed, but only abnormal results are displayed) Labs Reviewed - No data to display  EKG   Radiology No results found.  Procedures Procedures (including critical care time)  Medications Ordered in UC Medications - No data to display  Initial Impression / Assessment and Plan / UC Course  I have reviewed the triage vital signs and the nursing notes.  Pertinent labs & imaging results that were available during my care of the patient were reviewed by me and considered in my medical decision making (see chart for details).  Rash  Unknown etiology, denies change in external factors, appears inflammatory without signs of infection, discussed, declined oral steroid use, prescribed betamethasone topical cream recommended use of antihistamine with famotidine  discussed supportive care for management of pruritus and advised PCP follow-up Final Clinical Impressions(s) / UC Diagnoses   Final diagnoses:  Rash and nonspecific skin eruption     Discharge Instructions      Today you are evaluated for your rash which appears inflammatory, no signs of infection  Use steroid cream twice daily, this is stronger than the one that you were prescribed prior  Begin use of a daily allergy medicine such as Claritin  or Zyrtec to help reduce response  Take famotidine  daily while taking allergy medicine as  this covers the other allergy receptor will increase chances of a reduced inflammatory response  You may apply a topical Benadryl  cream or calamine lotion over the affected area to help with itching  If the area becomes inflamed due to heat to cool the skin down with a rag  For persisting symptoms please follow-up with your primary doctor as you may need allergy testing   ED Prescriptions     Medication Sig Dispense Auth. Provider   betamethasone valerate lotion (VALISONE) 0.1 % Apply 1 Application topically 2 (two) times daily. 60 mL Shea Swalley R, NP   famotidine  (PEPCID ) 20 MG tablet Take 1 tablet (20 mg total) by mouth daily. 30 tablet Omarion Minnehan R, NP      PDMP not reviewed this encounter.   Teresa Shelba SAUNDERS, NP 10/07/24 1315

## 2024-10-10 ENCOUNTER — Telehealth: Payer: Self-pay | Admitting: Hematology and Oncology

## 2024-10-10 NOTE — Telephone Encounter (Signed)
 Scheduled appointments per referral. Talked with the patient and she is aware of the appointment time and date as well as the address. Patient was informed to arrive 10-15 minutes prior with updated insurance information. Patient was also informed that two guests are permitted but needs to be at least 77 years of age. All questions were answered.

## 2024-10-10 NOTE — Progress Notes (Signed)
 Referral received from PCP, patient scheduled with Johnston 11/01/24.

## 2024-10-10 NOTE — Telephone Encounter (Signed)
Left vm for pt to call back and schedule f/u appt

## 2024-10-31 NOTE — Progress Notes (Unsigned)
 [facility] [phone]   [phone]  PROGRESS NOTE  Patient Care Team: Marvine Rush, MD as PCP - General (Family Medicine) Cathlyn JAYSON Nikki Bobie FORBES, MD as Consulting Physician (Obstetrics and Gynecology) Elana Montie CROME, RN as Oncology Nurse Navigator  Hematological/Oncological History 12/05/2023: SPEP detected M spike measuring 0.2 g/dL. IFE showed IgG monoclonal protein with kappa light chain specificity. Serum free light chains were normal. CBC normal. CMP showed creatinine 1.11, calcium normal. 24 hour UPEP showed no M spike.  12/31/2023: Establish care with [facility] Hematology with Dr. Loretha  CHIEF COMPLAINTS/PURPOSE OF CONSULTATION:  IgG Kappa monoclonal gammopathy  HISTORY OF PRESENTING ILLNESS:  Victoria Andrade 78 y.o. female with medical history significant for Sjogren's syndrome, hypertension, fibromyalgia, GERD, arthritis, irritable bowel syndrome and diabetes presents to the hematology clinic for evaluation for MGUS.  She is unaccompanied for this visit.  On exam today, Ms. Oncale reports she does have chronic fatigue that is worsening and requiring frequent resting.  She struggles with balance issues and is currently undergoing further evaluation including MRI of the brain to further evaluate.  She is currently undergoing physical therapy to help with her balance.  She reports having nausea secondary to Ozempic therapy.  She denies any vomiting or abdominal pain.  She reports her appetite is unchanged and she denies any recent weight loss.  She has intermittent episodes of diarrhea and constipation secondary to IBS.  She has dry mouth secondary to Sjogren's syndrome.  She denies any bruising or signs of active bleeding including hematochezia or melena.  Patient denies fevers, chills, night sweats, shortness of breath, chest pain or cough.  Rest of the 10 point ROS as below.  MEDICAL HISTORY:  Past Medical History:  Diagnosis Date   Anxiety     takes xanax    Arthritis    fingers and neck   Cataract immature    Diabetes mellitus without complication (HCC)    Elevated hemoglobin A1c 02/2015   level 6.2   Family history of adverse reaction to anesthesia    uncle had a stroke following anesthesia   Fatty liver    Fibromyalgia    GERD (gastroesophageal reflux disease)    History of bronchitis    History of hiatal hernia    History of kidney stones    Hypertension    IBS (irritable bowel syndrome)    Imbalance    Macular degeneration    Melanoma (HCC)    left eye   Migraine    Neuropathy    Osteopenia 2016   PONV (postoperative nausea and vomiting)    Benadryl  help with N/V since she can no longer have SCOP; difficulty to awaken   Shingles 2021   Sjogren syndrome     SURGICAL HISTORY: Past Surgical History:  Procedure Laterality Date   ABDOMINAL HYSTERECTOMY  1978   partial   BILATERAL SALPINGOOPHORECTOMY  2010   with robot   BREAST BIOPSY Right 03/06/2008   benign   CATARACT EXTRACTION, BILATERAL  2018   CHOLECYSTECTOMY  2005   lap   COLONOSCOPY     CYSTOSCOPY N/A 03/13/2015   Procedure: CYSTOSCOPY;  Surgeon: Bobie FORBES Cathlyn JAYSON Nikki, MD;  Location: [facility];  Service: Gynecology;  Laterality: N/A;   CYSTOSCOPY WITH RETROGRADE PYELOGRAM, URETEROSCOPY AND STENT PLACEMENT Left 12/17/2020   Procedure: CYSTOSCOPY WITH LEFT RETROGRADE PYELOGRAM, URETEROSCOPY HOLMIUM LASER AND STENT PLACEMENT;  Surgeon: Renda Glance, MD;  Location: [facility];  Service: Urology;  Laterality: Left;  REQUESTING 2 HRS   CYSTOSCOPY/URETEROSCOPY/HOLMIUM LASER/STENT PLACEMENT Left 10/29/2020   Procedure: CYSTOSCOPY/RETROGRADE/URETEROSCOPY/HOLMIUM LASER/STENT PLACEMENT;  Surgeon: Renda Glance, MD;  Location: [facility];  Service: Urology;  Laterality: Left;   CYSTOSCOPY/URETEROSCOPY/HOLMIUM LASER/STENT PLACEMENT Left 01/21/2021   Procedure: CYSTOSCOPY/RETROGRADE/URETEROSCOPY/HOLMIUM LASER/STENT PLACEMENT;  Surgeon: Renda Glance, MD;  Location: [facility];  Service: Urology;  Laterality: Left;   CYSTOSCOPY/URETEROSCOPY/HOLMIUM LASER/STENT PLACEMENT Right 03/27/2022   Procedure: CYSTOSCOPY/RETROGRADE/URETEROSCOPY/HOLMIUM LASER/STENT PLACEMENT;  Surgeon: Renda Glance, MD;  Location: [facility];  Service: Urology;  Laterality: Right;   CYSTOSCOPY/URETEROSCOPY/HOLMIUM LASER/STENT PLACEMENT Bilateral 10/29/2023   Procedure: CYSTOSCOPY/BILATERAL RETROGRADE PYELOGRAM/URETEROSCOPY/URETERAL STENT PLACEMENT/HOLMIUM LASER;  Surgeon: Renda Glance, MD;  Location: [facility];  Service: Urology;  Laterality: Bilateral;  90 MINUTES NEEDED FOR CASE   IR NEPHRO TUBE REMOV/FL  03/20/2020   IR NEPHROSTOGRAM RIGHT THRU EXISTING ACCESS  03/20/2020   IR URETERAL STENT RIGHT NEW ACCESS W/O SEP NEPHROSTOMY CATH  03/15/2020   LITHOTRIPSY     20 years ago   NEPHROLITHOTOMY Right 03/15/2020   Procedure: NEPHROLITHOTOMY PERCUTANEOUS/ INTERVENTIONAL RADIOLOCGY TO PLACE POSTERIOR CALYX PERCUTANEOUS ACCESS PRIOR;  Surgeon: Renda Glance, MD;  Location: [facility];  Service: Urology;  Laterality: Right;  NEEDS 150 MIN FOR PROCEDURE   ROBOTIC ASSISTED SALPINGO OOPHERECTOMY Left 03/13/2015   Procedure: ROBOTIC ASSISTED INCISION OF  LEFT PELVIC MASS, LYSIS OF ADHESIONS  WITH PELVIC WASHINGS;  Surgeon: Bobie FORBES Cathlyn JAYSON Nikki, MD;  Location: [facility];  Service: Gynecology;  Laterality: Left;   SHOULDER SURGERY Left    bone spurs   TONSILLECTOMY     as child   TUBAL LIGATION  1976   UPPER GI ENDOSCOPY      SOCIAL HISTORY: Social History   Socioeconomic History   Marital status: Widowed    Spouse name: Not on file   Number of children: Not on file   Years of education: Not on file   Highest education level: Not on file  Occupational History   Not on file  Tobacco Use   Smoking status: Never   Smokeless tobacco: Never  Vaping Use   Vaping status: Never Used  Substance and Sexual Activity   Alcohol use: Yes    Comment: rarely   Drug use: Never   Sexual activity: Not Currently     Partners: Male    Birth control/protection: Surgical    Comment: TAH 1978/BSO 2010, less than 5, IC after 17, no STD, no abnormal pap, no DES  Other Topics Concern   Not on file  Social History Narrative   Not on file   Social Drivers of Health   Financial Resource Strain: Low Risk  (08/27/2022)   Overall Financial Resource Strain (CARDIA)    Difficulty of Paying Living Expenses: Not hard at all  Food Insecurity: No Food Insecurity (11/01/2024)   Hunger Vital Sign    Worried About Running Out of Food in the Last Year: Never true    Ran Out of Food in the Last Year: Never true  Transportation Needs: No Transportation Needs (11/01/2024)   PRAPARE - Administrator, Civil Service (Medical): No    Lack of Transportation (Non-Medical): No  Physical Activity: Not on file  Stress: Not on file  Social Connections: Not on file  Intimate Partner Violence: Not At Risk (11/01/2024)   Humiliation, Afraid, Rape, and Kick questionnaire    Fear of Current or Ex-Partner: No    Emotionally Abused: No    Physically Abused: No    Sexually Abused: No  FAMILY HISTORY: Family History  Adopted: Yes  Problem Relation Age of Onset   Hypertension Mother    Thyroid  disease Mother    Diabetes Maternal Grandmother    Hypertension Maternal Grandmother    Heart attack Maternal Grandmother    Hypertension Maternal Grandfather    Stroke Maternal Grandfather    Stroke Maternal Uncle    Stroke Maternal Uncle    Heart attack Maternal Uncle    Heart attack Maternal Uncle    Lung cancer Maternal Uncle     ALLERGIES:  is allergic to amitriptyline hcl, bystolic [nebivolol hcl], escitalopram, hydroxychloroquine, latex, metformin, oxycodone, prednisone, adhesive [tape], aspirin, empagliflozin, glimepiride, glipizide , hydrocodone , other, tramadol , cephalosporins, chlorhexidine , ciprofloxacin, codeine, and penicillins.  MEDICATIONS:  Current Outpatient Medications  Medication Sig Dispense Refill    ACCU-CHEK GUIDE test strip      Accu-Chek Softclix Lancets lancets      ALPRAZolam  (XANAX ) 1 MG tablet Take 0.5-1.5 mg by mouth See admin instructions. Take 1.5 tablets (1.5 mg) by mouth scheduled every night & may take 0.5 tablet (0.5 mg) by mouth daily if needed for anxiety.     Blood Glucose Monitoring Suppl (ACCU-CHEK GUIDE) w/Device KIT      Cholecalciferol (VITAMIN D3) 5000 UNITS CAPS Take 5,000 Units by mouth in the morning.     diphenhydrAMINE  (BENADRYL ) 25 MG tablet Take 25 mg by mouth at bedtime.     docusate sodium  (COLACE) 100 MG capsule Take 100 mg by mouth 2 (two) times daily as needed (constipation.).     DULoxetine  (CYMBALTA ) 60 MG capsule Take 60 mg by mouth at bedtime.     ezetimibe (ZETIA) 10 MG tablet Take 10 mg by mouth at bedtime.     famotidine  (PEPCID ) 20 MG tablet Take 20 mg by mouth daily as needed for heartburn or indigestion.      famotidine  (PEPCID ) 20 MG tablet Take 1 tablet (20 mg total) by mouth daily. 30 tablet 0   FIBER ADULT GUMMIES PO Take 2 tablets by mouth 2 (two) times a week.     gabapentin (NEURONTIN) 100 MG capsule Take 100-200 mg by mouth See admin instructions. Take 1 capsule (100 mg) by mouth in the morning & take 2 capsules (200 mg) by mouth at bedtime.     hydrochlorothiazide  (HYDRODIURIL ) 25 MG tablet Take 50 mg by mouth in the morning.     loratadine  (CLARITIN ) 10 MG tablet Take 10 mg by mouth daily.     montelukast  (SINGULAIR ) 10 MG tablet Take 10 mg by mouth at bedtime.     Multiple Vitamins-Minerals (PRESERVISION AREDS 2 PO) Take 1 tablet by mouth in the morning and at bedtime.     OZEMPIC, 2 MG/DOSE, 8 MG/3ML SOPN Inject 2 mg into the skin once a week.     Polyethyl Glycol-Propyl Glycol (SYSTANE ULTRA PF OP) Place 1 drop into both eyes in the morning and at bedtime.      polyethylene glycol (MIRALAX / GLYCOLAX) 17 g packet Take 17 g by mouth in the morning.     rosuvastatin (CRESTOR) 5 MG tablet Take 5 mg by mouth every evening.      simethicone  (MYLICON) 80 MG chewable tablet Chew 80 mg by mouth daily as needed (gas).     traMADol  (ULTRAM ) 50 MG tablet Take 1-2 tablets (50-100 mg total) by mouth every 6 (six) hours as needed (pain). 15 tablet 0   betamethasone valerate lotion (VALISONE) 0.1 % Apply 1 Application topically 2 (two) times daily. (Patient  not taking: Reported on 11/01/2024) 60 mL 0   OZEMPIC, 1 MG/DOSE, 4 MG/3ML SOPN Inject 1 mg into the skin every Saturday. (Patient not taking: Reported on 11/01/2024)     polyethylene glycol-electrolytes (NULYTELY) 420 g solution See admin instructions. (Patient not taking: Reported on 11/01/2024)     triamcinolone  ointment (KENALOG ) 0.1 % Apply 1 Application topically 2 (two) times daily. (Patient not taking: Reported on 11/01/2024) 30 g 0   No current facility-administered medications for this visit.    REVIEW OF SYSTEMS:   Constitutional: ( - ) fevers, ( - )  chills , ( - ) night sweats Eyes: ( - ) blurriness of vision, ( - ) double vision, ( - ) watery eyes Ears, nose, mouth, throat, and face: ( - ) mucositis, ( - ) sore throat Respiratory: ( - ) cough, ( - ) dyspnea, ( - ) wheezes Cardiovascular: ( - ) palpitation, ( - ) chest discomfort, ( - ) lower extremity swelling Gastrointestinal:  ( + ) nausea, ( - ) heartburn, ( - ) change in bowel habits Skin: ( - ) abnormal skin rashes Lymphatics: ( - ) new lymphadenopathy, ( - ) easy bruising Neurological: ( - ) numbness, ( - ) tingling, ( - ) new weaknesses Behavioral/Psych: ( - ) mood change, ( - ) new changes  All other systems were reviewed with the patient and are negative.  PHYSICAL EXAMINATION: ECOG PERFORMANCE STATUS: 1 - Symptomatic but completely ambulatory  Vitals:   11/01/24 0920  BP: (!) 144/61  Pulse: 83  Resp: 18  Temp: 97.6 F (36.4 C)  SpO2: 96%   Filed Weights   11/01/24 0920  Weight: 177 lb 8 oz (80.5 kg)    GENERAL: well appearing female in NAD  SKIN: skin color, texture, turgor are normal,  no rashes or significant lesions EYES: conjunctiva are pink and non-injected, sclera clear LYMPH:  no palpable lymphadenopathy in the cervical or supraclavicular lymph nodes.  LUNGS: clear to auscultation and percussion with normal breathing effort HEART: regular rate & rhythm and no murmurs and no lower extremity edema Musculoskeletal: no cyanosis of digits and no clubbing  PSYCH: alert & oriented x 3, fluent speech NEURO: no focal motor/sensory deficits  LABORATORY DATA:  I have reviewed the data as listed    Latest Ref Rng & Units 12/05/2023   12:07 PM 10/22/2023    1:30 PM 03/25/2022   11:11 AM  CBC  WBC 4.0 - 10.5 K/uL 8.6  11.1  11.8   Hemoglobin 12.0 - 15.0 g/dL 86.1  85.3  85.3   Hematocrit 36.0 - 46.0 % 42.4  44.8  44.2   Platelets 150 - 400 K/uL 276  274  318        Latest Ref Rng & Units 12/05/2023   12:07 PM 10/22/2023    1:30 PM 05/06/2023    9:11 AM  CMP  Glucose 70 - 99 mg/dL 791  887  820   BUN 8 - 23 mg/dL 27  20  17    Creatinine 0.44 - 1.00 mg/dL 8.88  9.01  8.93   Sodium 135 - 145 mmol/L 139  141  142   Potassium 3.5 - 5.1 mmol/L 3.8  3.2  4.1   Chloride 98 - 111 mmol/L 100  103  101   CO2 22 - 32 mmol/L 31  26  24    Calcium 8.9 - 10.3 mg/dL 9.9  9.8  9.8   Total Protein 6.5 - 8.1  g/dL 6.7     Total Bilirubin <1.2 mg/dL 0.5     Alkaline Phos 38 - 126 U/L 71     AST 15 - 41 U/L 20     ALT 0 - 44 U/L 14       RADIOGRAPHIC STUDIES: I have personally reviewed the radiological images as listed and agreed with the findings in the report. No results found.  ASSESSMENT & PLAN DEONNA KRUMMEL is a 78 y.o. female who presents to the clinic for IgG Kappa MGUS.  #IgG Kappa MGUS: -- Labs from 12/05/2023 included SPEP that is active M spike measuring 0.2 g/dL.  Immunofixation showed IgG monoclonal protein with kappa light chain specificity.  Serum free light chains were normal.  CBC showed no cytopenias with WBC 8.6, hemoglobin 13.8, platelets 276.  CMP detected mild  elevation in creatinine levels measuring 1.11 mg/dL.  Calcium levels are normal.  24-hour UPEP did not detect monoclonal protein. -- Patient will proceed with repeat labs to check SPEP with IFE, serum free light chains, CBC, CMP, beta-2  microglobulin and 24-hour UPEP.  -- Will obtain bone met survey to evaluate for lytic lesions. -- Tentative plan to return in 6 months with repeat labs unless above studies require intervention.  No orders of the defined types were placed in this encounter.   All questions were answered. The patient knows to call the clinic with any problems, questions or concerns.  I have spent a total of 30 minutes minutes of face-to-face and non-face-to-face time, preparing to see the patient,performing a medically appropriate examination, counseling and educating the patient, ordering medications/tests/procedures, documenting clinical information in the electronic health record, independently interpreting results and communicating results to the patient, and care coordination.   Johnston Police, PA-C Department of Hematology/Oncology [facility] Phone: [phone]

## 2024-11-01 ENCOUNTER — Encounter: Payer: Self-pay | Admitting: Physician Assistant

## 2024-11-01 ENCOUNTER — Inpatient Hospital Stay: Attending: Physician Assistant | Admitting: Physician Assistant

## 2024-11-01 ENCOUNTER — Inpatient Hospital Stay

## 2024-11-01 VITALS — BP 144/61 | HR 83 | Temp 97.6°F | Resp 18 | Ht 61.0 in | Wt 177.5 lb

## 2024-11-01 DIAGNOSIS — M899 Disorder of bone, unspecified: Secondary | ICD-10-CM | POA: Insufficient documentation

## 2024-11-01 DIAGNOSIS — K589 Irritable bowel syndrome without diarrhea: Secondary | ICD-10-CM | POA: Insufficient documentation

## 2024-11-01 DIAGNOSIS — Z801 Family history of malignant neoplasm of trachea, bronchus and lung: Secondary | ICD-10-CM | POA: Diagnosis not present

## 2024-11-01 DIAGNOSIS — D472 Monoclonal gammopathy: Secondary | ICD-10-CM

## 2024-11-01 DIAGNOSIS — M3500 Sicca syndrome, unspecified: Secondary | ICD-10-CM | POA: Insufficient documentation

## 2024-11-01 DIAGNOSIS — Z7985 Long-term (current) use of injectable non-insulin antidiabetic drugs: Secondary | ICD-10-CM | POA: Insufficient documentation

## 2024-11-01 DIAGNOSIS — R7989 Other specified abnormal findings of blood chemistry: Secondary | ICD-10-CM | POA: Diagnosis not present

## 2024-11-01 DIAGNOSIS — E1140 Type 2 diabetes mellitus with diabetic neuropathy, unspecified: Secondary | ICD-10-CM | POA: Insufficient documentation

## 2024-11-01 DIAGNOSIS — R530 Neoplastic (malignant) related fatigue: Secondary | ICD-10-CM | POA: Insufficient documentation

## 2024-11-01 LAB — CBC WITH DIFFERENTIAL (CANCER CENTER ONLY)
Abs Immature Granulocytes: 0.03 K/uL (ref 0.00–0.07)
Basophils Absolute: 0.1 K/uL (ref 0.0–0.1)
Basophils Relative: 1 %
Eosinophils Absolute: 0 K/uL (ref 0.0–0.5)
Eosinophils Relative: 0 %
HCT: 45.4 % (ref 36.0–46.0)
Hemoglobin: 15.3 g/dL — ABNORMAL HIGH (ref 12.0–15.0)
Immature Granulocytes: 0 %
Lymphocytes Relative: 16 %
Lymphs Abs: 1.6 K/uL (ref 0.7–4.0)
MCH: 29.5 pg (ref 26.0–34.0)
MCHC: 33.7 g/dL (ref 30.0–36.0)
MCV: 87.5 fL (ref 80.0–100.0)
Monocytes Absolute: 0.6 K/uL (ref 0.1–1.0)
Monocytes Relative: 6 %
Neutro Abs: 8 K/uL — ABNORMAL HIGH (ref 1.7–7.7)
Neutrophils Relative %: 77 %
Platelet Count: 311 K/uL (ref 150–400)
RBC: 5.19 MIL/uL — ABNORMAL HIGH (ref 3.87–5.11)
RDW: 13 % (ref 11.5–15.5)
WBC Count: 10.3 K/uL (ref 4.0–10.5)
nRBC: 0 % (ref 0.0–0.2)

## 2024-11-01 LAB — CMP (CANCER CENTER ONLY)
ALT: 14 U/L (ref 0–44)
AST: 24 U/L (ref 15–41)
Albumin: 4.7 g/dL (ref 3.5–5.0)
Alkaline Phosphatase: 90 U/L (ref 38–126)
Anion gap: 11 (ref 5–15)
BUN: 29 mg/dL — ABNORMAL HIGH (ref 8–23)
CO2: 32 mmol/L (ref 22–32)
Calcium: 10.1 mg/dL (ref 8.9–10.3)
Chloride: 96 mmol/L — ABNORMAL LOW (ref 98–111)
Creatinine: 1.25 mg/dL — ABNORMAL HIGH (ref 0.44–1.00)
GFR, Estimated: 44 mL/min — ABNORMAL LOW (ref >60)
Glucose, Bld: 219 mg/dL — ABNORMAL HIGH (ref 70–99)
Potassium: 3 mmol/L — ABNORMAL LOW (ref 3.5–5.1)
Sodium: 139 mmol/L (ref 135–145)
Total Bilirubin: 0.7 mg/dL (ref 0.0–1.2)
Total Protein: 7.9 g/dL (ref 6.5–8.1)

## 2024-11-02 LAB — KAPPA/LAMBDA LIGHT CHAINS
Kappa free light chain: 18.4 mg/L (ref 3.3–19.4)
Kappa, lambda light chain ratio: 1.32 (ref 0.26–1.65)
Lambda free light chains: 13.9 mg/L (ref 5.7–26.3)

## 2024-11-02 LAB — BETA 2 MICROGLOBULIN, SERUM: Beta-2 Microglobulin: 3 mg/L — ABNORMAL HIGH (ref 0.6–2.4)

## 2024-11-03 ENCOUNTER — Ambulatory Visit (HOSPITAL_COMMUNITY)
Admission: RE | Admit: 2024-11-03 | Discharge: 2024-11-03 | Disposition: A | Discharge status: Home / Self Care | Source: Ambulatory Visit | Attending: Physician Assistant | Admitting: Physician Assistant

## 2024-11-03 DIAGNOSIS — D472 Monoclonal gammopathy: Secondary | ICD-10-CM | POA: Diagnosis present

## 2024-11-08 LAB — MULTIPLE MYELOMA PANEL, SERUM
Albumin SerPl Elph-Mcnc: 3.9 g/dL (ref 2.9–4.4)
Albumin/Glob SerPl: 1.3 (ref 0.7–1.7)
Alpha 1: 0.2 g/dL (ref 0.0–0.4)
Alpha2 Glob SerPl Elph-Mcnc: 1 g/dL (ref 0.4–1.0)
B-Globulin SerPl Elph-Mcnc: 1.2 g/dL (ref 0.7–1.3)
Gamma Glob SerPl Elph-Mcnc: 0.7 g/dL (ref 0.4–1.8)
Globulin, Total: 3.1 g/dL (ref 2.2–3.9)
IgA: 180 mg/dL (ref 64–422)
IgG (Immunoglobin G), Serum: 880 mg/dL (ref 586–1602)
IgM (Immunoglobulin M), Srm: 101 mg/dL (ref 26–217)
M Protein SerPl Elph-Mcnc: 0.3 g/dL — ABNORMAL HIGH
Total Protein ELP: 7 g/dL (ref 6.0–8.5)

## 2024-11-09 ENCOUNTER — Other Ambulatory Visit: Payer: Self-pay | Admitting: *Deleted

## 2024-11-09 DIAGNOSIS — D472 Monoclonal gammopathy: Secondary | ICD-10-CM | POA: Diagnosis not present

## 2024-11-11 NOTE — Progress Notes (Signed)
 Patients bone scan with out lesions, will follow up in 6 months in clinic.

## 2024-11-14 ENCOUNTER — Telehealth: Payer: Self-pay | Admitting: Physician Assistant

## 2024-11-14 DIAGNOSIS — D472 Monoclonal gammopathy: Secondary | ICD-10-CM

## 2024-11-14 NOTE — Telephone Encounter (Signed)
 I called [name] to review lab results from 11/01/2024. Findings show overall stable M protein measuring 0.3 g/dL. Serum free light chains are normal. No cytopenias detected, overall stable creatinine level measuring 1.25 mg/dL. Bone met survey showed no evidence of lytic lesions. Pending 24 hour UPEP results, no evidence of transformation to multiple myeloma. Advised to monitor and we will repeat labs in 6 months as scheduled.   Incidentally found to have hypokalemia with potassium level of 3.0. Patient is currently out of town this week. Advised to eat potassium rich foods and stay hydrated this week. We will recheck potassium level on Monday, 11/24. If there is persistent hypokalemia, we will start patient on potassium supplementation.   [name] expressed understanding of the plan provided.

## 2024-11-17 ENCOUNTER — Ambulatory Visit: Payer: Self-pay | Admitting: Physician Assistant

## 2024-11-17 LAB — UPEP/UIFE/LIGHT CHAINS/TP, 24-HR UR
% BETA, Urine: 15.5 %
ALPHA 1 URINE: 3.5 %
Albumin, U: 52.2 %
Alpha 2, Urine: 14.5 %
Free Kappa Lt Chains,Ur: 7.91 mg/L (ref 1.17–86.46)
Free Kappa/Lambda Ratio: 5.69 (ref 1.83–14.26)
Free Lambda Lt Chains,Ur: 1.39 mg/L (ref 0.27–15.21)
GAMMA GLOBULIN URINE: 14.2 %
M-SPIKE %, Urine: 2.9 % — ABNORMAL HIGH
M-Spike, Mg/24 Hr: 6 mg/(24.h) — ABNORMAL HIGH
Total Protein, Urine-Ur/day: 193 mg/(24.h) — ABNORMAL HIGH (ref 30–150)
Total Protein, Urine: 32.1 mg/dL
Total Volume: 600

## 2024-11-21 ENCOUNTER — Inpatient Hospital Stay

## 2024-11-21 ENCOUNTER — Other Ambulatory Visit: Payer: Self-pay | Admitting: Physician Assistant

## 2024-11-21 DIAGNOSIS — D472 Monoclonal gammopathy: Secondary | ICD-10-CM

## 2024-11-21 LAB — CBC WITH DIFFERENTIAL/PLATELET
Abs Immature Granulocytes: 0.01 K/uL (ref 0.00–0.07)
Basophils Absolute: 0.1 K/uL (ref 0.0–0.1)
Basophils Relative: 1 %
Eosinophils Absolute: 0.1 K/uL (ref 0.0–0.5)
Eosinophils Relative: 1 %
HCT: 41.9 % (ref 36.0–46.0)
Hemoglobin: 14.4 g/dL (ref 12.0–15.0)
Immature Granulocytes: 0 %
Lymphocytes Relative: 21 %
Lymphs Abs: 1.5 K/uL (ref 0.7–4.0)
MCH: 30.1 pg (ref 26.0–34.0)
MCHC: 34.4 g/dL (ref 30.0–36.0)
MCV: 87.5 fL (ref 80.0–100.0)
Monocytes Absolute: 0.3 K/uL (ref 0.1–1.0)
Monocytes Relative: 5 %
Neutro Abs: 5.2 K/uL (ref 1.7–7.7)
Neutrophils Relative %: 72 %
Platelets: 242 K/uL (ref 150–400)
RBC: 4.79 MIL/uL (ref 3.87–5.11)
RDW: 13 % (ref 11.5–15.5)
WBC: 7.1 K/uL (ref 4.0–10.5)
nRBC: 0 % (ref 0.0–0.2)

## 2024-11-21 LAB — CMP (CANCER CENTER ONLY)
ALT: 16 U/L (ref 0–44)
AST: 30 U/L (ref 15–41)
Albumin: 4.3 g/dL (ref 3.5–5.0)
Alkaline Phosphatase: 81 U/L (ref 38–126)
Anion gap: 13 (ref 5–15)
BUN: 16 mg/dL (ref 8–23)
CO2: 28 mmol/L (ref 22–32)
Calcium: 9.6 mg/dL (ref 8.9–10.3)
Chloride: 101 mmol/L (ref 98–111)
Creatinine: 1.22 mg/dL — ABNORMAL HIGH (ref 0.44–1.00)
GFR, Estimated: 45 mL/min — ABNORMAL LOW (ref >60)
Glucose, Bld: 188 mg/dL — ABNORMAL HIGH (ref 70–99)
Potassium: 3.1 mmol/L — ABNORMAL LOW (ref 3.5–5.1)
Sodium: 141 mmol/L (ref 135–145)
Total Bilirubin: 0.5 mg/dL (ref 0.0–1.2)
Total Protein: 7 g/dL (ref 6.5–8.1)

## 2024-11-21 MED ORDER — POTASSIUM CHLORIDE CRYS ER 20 MEQ PO TBCR: 20.0000 meq | EXTENDED_RELEASE_TABLET | Freq: Every day | ORAL | 0 refills | Status: DC

## 2024-11-22 ENCOUNTER — Ambulatory Visit: Payer: Self-pay

## 2024-11-22 NOTE — Telephone Encounter (Signed)
 Pt advised of low potassium and new prescription sent to her pharmacy.  Pt does not see her new PCP until Jan so she has been scheduled for a lab only appt at [facility] on 12/07/25 at 12:00

## 2024-11-22 NOTE — Telephone Encounter (Signed)
-----   Message from Johnston ONEIDA Police sent at 11/21/2024  3:22 PM EST ----- Please notify patient that potassium levels are still low at 3.1. I sent in a prescription for potassium chloride  20 mEq once daily.   Please contact her PCP to repeat her potassium levels in 2 weeks to determine if she needs to be on long term supplementation   ----- Message ----- From: Interface, Lab In [place] Sent: 11/21/2024   1:47 PM EST To: Johnston ONEIDA Police, PA-C

## 2024-12-06 ENCOUNTER — Other Ambulatory Visit: Payer: Self-pay | Admitting: *Deleted

## 2024-12-06 DIAGNOSIS — D472 Monoclonal gammopathy: Secondary | ICD-10-CM

## 2024-12-07 ENCOUNTER — Inpatient Hospital Stay: Attending: Physician Assistant

## 2024-12-07 DIAGNOSIS — D472 Monoclonal gammopathy: Secondary | ICD-10-CM

## 2024-12-07 LAB — CBC WITH DIFFERENTIAL (CANCER CENTER ONLY)
Abs Immature Granulocytes: 0.02 K/uL (ref 0.00–0.07)
Basophils Absolute: 0.1 K/uL (ref 0.0–0.1)
Basophils Relative: 1 %
Eosinophils Absolute: 0.1 K/uL (ref 0.0–0.5)
Eosinophils Relative: 1 %
HCT: 44.1 % (ref 36.0–46.0)
Hemoglobin: 14.8 g/dL (ref 12.0–15.0)
Immature Granulocytes: 0 %
Lymphocytes Relative: 19 %
Lymphs Abs: 1.4 K/uL (ref 0.7–4.0)
MCH: 29.6 pg (ref 26.0–34.0)
MCHC: 33.6 g/dL (ref 30.0–36.0)
MCV: 88.2 fL (ref 80.0–100.0)
Monocytes Absolute: 0.3 K/uL (ref 0.1–1.0)
Monocytes Relative: 4 %
Neutro Abs: 5.7 K/uL (ref 1.7–7.7)
Neutrophils Relative %: 75 %
Platelet Count: 259 K/uL (ref 150–400)
RBC: 5 MIL/uL (ref 3.87–5.11)
RDW: 13 % (ref 11.5–15.5)
WBC Count: 7.6 K/uL (ref 4.0–10.5)
nRBC: 0 % (ref 0.0–0.2)

## 2024-12-07 LAB — CMP (CANCER CENTER ONLY)
ALT: 13 U/L (ref 0–44)
AST: 26 U/L (ref 15–41)
Albumin: 4.3 g/dL (ref 3.5–5.0)
Alkaline Phosphatase: 95 U/L (ref 38–126)
Anion gap: 11 (ref 5–15)
BUN: 23 mg/dL (ref 8–23)
CO2: 28 mmol/L (ref 22–32)
Calcium: 9.9 mg/dL (ref 8.9–10.3)
Chloride: 101 mmol/L (ref 98–111)
Creatinine: 1.07 mg/dL — ABNORMAL HIGH (ref 0.44–1.00)
GFR, Estimated: 53 mL/min — ABNORMAL LOW (ref >60)
Glucose, Bld: 218 mg/dL — ABNORMAL HIGH (ref 70–99)
Potassium: 3.7 mmol/L (ref 3.5–5.1)
Sodium: 141 mmol/L (ref 135–145)
Total Bilirubin: 0.7 mg/dL (ref 0.0–1.2)
Total Protein: 7 g/dL (ref 6.5–8.1)

## 2025-01-24 ENCOUNTER — Encounter: Payer: Self-pay | Admitting: Family Medicine

## 2025-01-24 DIAGNOSIS — H3530 Unspecified macular degeneration: Secondary | ICD-10-CM | POA: Insufficient documentation

## 2025-01-24 DIAGNOSIS — E1140 Type 2 diabetes mellitus with diabetic neuropathy, unspecified: Secondary | ICD-10-CM | POA: Insufficient documentation

## 2025-01-24 DIAGNOSIS — K581 Irritable bowel syndrome with constipation: Secondary | ICD-10-CM | POA: Insufficient documentation

## 2025-01-24 DIAGNOSIS — Z87442 Personal history of urinary calculi: Secondary | ICD-10-CM | POA: Insufficient documentation

## 2025-01-24 DIAGNOSIS — D472 Monoclonal gammopathy: Secondary | ICD-10-CM | POA: Insufficient documentation

## 2025-01-24 DIAGNOSIS — Z85820 Personal history of malignant melanoma of skin: Secondary | ICD-10-CM | POA: Insufficient documentation

## 2025-01-24 DIAGNOSIS — I10 Essential (primary) hypertension: Secondary | ICD-10-CM | POA: Insufficient documentation

## 2025-01-24 DIAGNOSIS — M3500 Sicca syndrome, unspecified: Secondary | ICD-10-CM | POA: Insufficient documentation

## 2025-01-24 DIAGNOSIS — Z780 Asymptomatic menopausal state: Secondary | ICD-10-CM | POA: Insufficient documentation

## 2025-01-24 DIAGNOSIS — K760 Fatty (change of) liver, not elsewhere classified: Secondary | ICD-10-CM | POA: Insufficient documentation

## 2025-01-24 DIAGNOSIS — E782 Mixed hyperlipidemia: Secondary | ICD-10-CM | POA: Insufficient documentation

## 2025-01-24 NOTE — Progress Notes (Signed)
 "    Rhandi Despain T. Tonya Wantz, MD, CAQ Sports Medicine [facility] [address] [place], [address]  Phone: [phone]  FAX: [phone]  Victoria Andrade - 79 y.o. female  MRN [id number]  Date of Birth: Nov 17, 1946  Date: 01/25/2025  PCP: Watt Mirza, MD  Referral: No ref. provider found  Chief Complaint  Patient presents with   Establish Care   Subjective:   Victoria Andrade is a 79 y.o. very pleasant female patient with Body mass index is 32.68 kg/m. who presents with the following:  Discussed the use of AI scribe software for clinical note transcription with the patient, who gave verbal consent to proceed.  Patient presents as a new physician appointment.  She is not a [place] or  primary care patient historically.  She has been getting her medical care at [facility], as well as [facility].  Dr. Norleen General retired.  Type 2 diabetes currently on Ozempic 2 mg.  She also has a history of diabetic neuropathy.  Most recent A1c was 7.6 and this was from July 2025.  Hypertension: Currently on hydrochlorothiazide  25 mg  Hyperlipidemia, currently on Crestor 5 mg and Zetia  10 mg.  History of anxiety, currently on Cymbalta  60 mg.  As needed Xanax . -PDMP reviewed, and the patient is taking Xanax  1 mg, dispense 120  She is not the best historian, but I did my best in an extended office visit going over all of her medical problems. History of Present Illness Victoria Andrade is a 79 year old female who presents for a comprehensive review of her medical history and management of her chronic conditions in a new patient office visit.  She has type 2 diabetes, managed with Ozempic 2 mg. Her most recent A1c was 6.7%. Recent blood work was done at [facility], but results are unavailable.  She has been a long-term patient at [facility]  She has a history of diverticulitis and underwent a colonoscopy last year at [place],  where several polyps were found. A follow-up is scheduled in two years. She has a history of hematochezia but does not recall specific details. - No records are available.  She has a long-standing history of nephrolithiasis since age 65, with three stones currently in her left kidney. She has undergone multiple procedures including lithotripsy, stent placements, and nephrostomy tube insertion due to the hardness of the stones.  She has a history of melanoma on her arm, treated a few years ago, and follows up with a dermatologist annually at [facility] dermatology.  She experiences anxiety and depression, exacerbated since her husband's pancreatic cancer diagnosis six years ago. She takes Cymbalta  60 mg daily and Xanax , which she has been on for over 25 years. She reports persistent anxiety and depression, especially concerning her grandchildren's health issues.  She has Sjogren's syndrome and degenerative arthritis, with a history of being tested for other autoimmune conditions. She experiences tremors in her hands, evaluated by neurology and not attributed to Parkinson's disease. - She is not really sure of the exact nature of her rheumatological conditions, and she sees rheumatology at [facility].  Records are not available.  She has macular degeneration, affecting her vision, particularly at night. She sleeps in a recliner due to difficulty seeing in the dark.  She has a history of multiple surgeries, including cataract surgery, cholecystectomy, hysterectomy, and shoulder surgery, but the exact surgical procedure is unclear.  no records are available. She reports tremors  in her hands, which affect her ability to perform fine motor tasks.  She is widowed, living alone, and has one son residing in [place] . She does not smoke, rarely consumes alcohol, and has never used street drugs.  Review of Systems is noted in the HPI, as appropriate  Patient Active Problem List   Diagnosis Date  Noted   Type 2 diabetes mellitus with diabetic neuropathy, without long-term current use of insulin  (HCC) 01/24/2025    Priority: High   Mixed hyperlipidemia 01/24/2025    Priority: Medium    History of melanoma 01/24/2025    Priority: Medium    Essential hypertension 01/24/2025    Priority: Medium    MGUS (monoclonal gammopathy of unknown significance) 01/24/2025    Priority: Medium    Osteopenia after menopause 01/24/2025    Priority: Low   Irritable bowel syndrome with constipation 01/24/2025    Priority: Low   Macular degeneration of both eyes 01/24/2025    Priority: Low   Nonalcoholic fatty liver disease 01/24/2025    Priority: Low   History of nephrolithiasis 01/24/2025    Priority: Low   Sjogren's syndrome without extraglandular involvement 01/24/2025    Priority: Low   GERD 01/01/2009    Priority: Low    Past Medical History:  Diagnosis Date   Diabetic neuropathy associated with type 2 diabetes mellitus (HCC)    Fatty liver    Fibromyalgia    Generalized anxiety disorder    GERD (gastroesophageal reflux disease)    History of hiatal hernia    History of kidney stones    Hypertension    IBS (irritable bowel syndrome)    Macular degeneration    Melanoma (HCC)    left eye   MGUS (monoclonal gammopathy of unknown significance) 01/24/2025   Migraine    Osteopenia 2016   Sjogren syndrome    Type 2 diabetes mellitus with diabetic neuropathy, without long-term current use of insulin  (HCC) 01/24/2025    Past Surgical History:  Procedure Laterality Date   ABDOMINAL HYSTERECTOMY  1978   partial   BREAST BIOPSY Right 03/06/2008   benign   CATARACT EXTRACTION, BILATERAL  2018   CYSTOSCOPY N/A 03/13/2015   Procedure: CYSTOSCOPY;  Surgeon: Bobie FORBES Cathlyn JAYSON Nikki, MD;  Location: [facility];  Service: Gynecology;  Laterality: N/A;   CYSTOSCOPY WITH RETROGRADE PYELOGRAM, URETEROSCOPY AND STENT PLACEMENT Left 12/17/2020   Procedure: CYSTOSCOPY WITH LEFT RETROGRADE  PYELOGRAM, URETEROSCOPY HOLMIUM LASER AND STENT PLACEMENT;  Surgeon: Renda Glance, MD;  Location: [facility];  Service: Urology;  Laterality: Left;  REQUESTING 2 HRS   CYSTOSCOPY/URETEROSCOPY/HOLMIUM LASER/STENT PLACEMENT Left 10/29/2020   Procedure: CYSTOSCOPY/RETROGRADE/URETEROSCOPY/HOLMIUM LASER/STENT PLACEMENT;  Surgeon: Renda Glance, MD;  Location: [facility];  Service: Urology;  Laterality: Left;   CYSTOSCOPY/URETEROSCOPY/HOLMIUM LASER/STENT PLACEMENT Left 01/21/2021   Procedure: CYSTOSCOPY/RETROGRADE/URETEROSCOPY/HOLMIUM LASER/STENT PLACEMENT;  Surgeon: Renda Glance, MD;  Location: [facility];  Service: Urology;  Laterality: Left;   CYSTOSCOPY/URETEROSCOPY/HOLMIUM LASER/STENT PLACEMENT Right 03/27/2022   Procedure: CYSTOSCOPY/RETROGRADE/URETEROSCOPY/HOLMIUM LASER/STENT PLACEMENT;  Surgeon: Renda Glance, MD;  Location: [facility];  Service: Urology;  Laterality: Right;   CYSTOSCOPY/URETEROSCOPY/HOLMIUM LASER/STENT PLACEMENT Bilateral 10/29/2023   Procedure: CYSTOSCOPY/BILATERAL RETROGRADE PYELOGRAM/URETEROSCOPY/URETERAL STENT PLACEMENT/HOLMIUM LASER;  Surgeon: Renda Glance, MD;  Location: [facility];  Service: Urology;  Laterality: Bilateral;  90 MINUTES NEEDED FOR CASE   IR NEPHRO TUBE REMOV/FL  03/20/2020   IR NEPHROSTOGRAM RIGHT THRU EXISTING ACCESS  03/20/2020   IR URETERAL STENT RIGHT NEW ACCESS W/O SEP NEPHROSTOMY CATH  03/15/2020   LAPAROSCOPIC CHOLECYSTECTOMY  2005   NEPHROLITHOTOMY Right 03/15/2020   Procedure: NEPHROLITHOTOMY PERCUTANEOUS/ INTERVENTIONAL RADIOLOCGY TO PLACE POSTERIOR CALYX PERCUTANEOUS ACCESS PRIOR;  Surgeon: Renda Glance, MD;  Location: [facility];  Service: Urology;  Laterality: Right;  NEEDS 150 MIN FOR PROCEDURE   ROBOTIC ASSISTED SALPINGO OOPHERECTOMY Left 03/13/2015   Procedure: ROBOTIC ASSISTED INCISION OF  LEFT PELVIC MASS, LYSIS OF ADHESIONS  WITH PELVIC WASHINGS;  Surgeon: Bobie FORBES Cathlyn JAYSON Nikki, MD;  Location: [facility];  Service: Gynecology;  Laterality: Left;    SHOULDER SURGERY Left    TONSILLECTOMY     TUBAL LIGATION  1976    Family History  Adopted: Yes  Problem Relation Age of Onset   Hypertension Mother    Thyroid  disease Mother    Diabetes Maternal Grandmother    Hypertension Maternal Grandmother    Heart attack Maternal Grandmother    Hypertension Maternal Grandfather    Stroke Maternal Grandfather    Stroke Maternal Uncle    Stroke Maternal Uncle    Heart attack Maternal Uncle    Heart attack Maternal Uncle    Lung cancer Maternal Uncle     Social History   Social History Narrative   Widow   One son   No tob, ETOH, drugs      Rare exercise     Objective:   BP 130/80   Pulse 78   Temp 98.1 F (36.7 C) (Oral)   Ht 5' 0.5 (1.537 m)   Wt 170 lb 2 oz (77.2 kg)   LMP 12/29/1976   SpO2 94%   BMI 32.68 kg/m   GEN: No acute distress; alert,appropriate. CV: RRR, no m/g/r  PULM: Normal respiratory rate, no accessory muscle use. No wheezes, crackles or rhonchi  PSYCH: Normally interactive.   Laboratory and Imaging Data:  Assessment and Plan:     ICD-10-CM   1. Type 2 diabetes mellitus with diabetic neuropathy, without long-term current use of insulin  (HCC)  E11.40     2. Mixed hyperlipidemia  E78.2     3. Osteopenia after menopause  M85.80    Z78.0     4. Irritable bowel syndrome with constipation  K58.1     5. Gastroesophageal reflux disease without esophagitis  K21.9     6. History of melanoma  Z85.820     7. Essential hypertension  I10     8. Macular degeneration of both eyes, unspecified type  H35.30     9. Nonalcoholic fatty liver disease  X23.9     10. History of nephrolithiasis  Z87.442     11. Sjogren's syndrome without extraglandular involvement  M35.00     12. MGUS (monoclonal gammopathy of unknown significance)  D47.2     13. Hearing loss associated with syndrome of both ears  H91.93 Ambulatory referral to ENT     Total encounter time: 45 minutes. This includes total time spent on the  day of encounter.  Difficult to patient office visit.  She is not the best historian.  I did ask very extensive record review, but none of her primary care notes, most specialists notes, and procedural notes are unavailable.  She has been getting her medical care at [facility] for many years, and they are not connected to epic.  I have sent records request, so hopefully we will be able to get those. Assessment & Plan Depression and anxiety Chronic depression and anxiety exacerbated by family health issues. Symptoms include low energy, trouble concentrating, and persistent worry. Intolerance to many  medications. - Increased duloxetine  to twice daily. - Prescribed 90 tablets of Xanax  for use as needed, max 1.5 tablets/day, with some leeway to add an additional during the day if needed. - Follow-up in two months to assess response.  Type 2 diabetes mellitus with diabetic neuropathy Type 2 diabetes managed with Ozempic 2 mg. Recent A1c improved to 6.7 from 7.6. - Obtain recent lab results from [facility] medical for review.  Essential hypertension Blood pressure well-controlled on hydrochlorothiazide .  Mixed hyperlipidemia Managed with rosuvastatin and Zetia .  Degenerative arthritis Chronic degenerative arthritis with pain, particularly in the left hip. - Use tramadol  sparingly for severe pain.  Sjogren's syndrome Sjogren's syndrome.  Macular degeneration Affecting night vision, particularly in the left eye.  History of melanoma Melanoma on the arm, under annual dermatological surveillance. - Continue annual dermatology follow-up.  Nephrolithiasis Chronic nephrolithiasis with three stones in the left kidney. History of lithotripsy and nephrostomy tube placement. - Continue follow-up with urology as needed.  Monoclonal gammopathy of undetermined significance (MGUS) MGUS with previous hematology follow-up.  Hearing loss, bilateral Bilateral hearing loss with  significant impairment in the left ear. Previous use of hearing aids.  The patient would like to discuss her case with ENT. - Referred to [facility] for evaluation and potential hearing aid fitting.  General health maintenance Discussion of medical record releases to consolidate care under primary care provider. - Signed record release forms for [facility], and urology to obtain comprehensive medical records. - [facility] records are available  Medication Management during today's office visit: Meds ordered this encounter  Medications   ALPRAZolam  (XANAX ) 1 MG tablet    Sig: Take 1-1.5 tablets (1-1.5 mg total) by mouth 2 (two) times daily as needed for anxiety. Take 1.5 tablets (1.5 mg) by mouth scheduled every night & may take 0.5 tablet (0.5 mg) by mouth daily if needed for anxiety.    Dispense:  90 tablet    Refill:  2   DULoxetine  (CYMBALTA ) 60 MG capsule    Sig: Take 1 capsule (60 mg total) by mouth 2 (two) times daily.    Dispense:  180 capsule    Refill:  1   ezetimibe  (ZETIA ) 10 MG tablet    Sig: Take 1 tablet (10 mg total) by mouth at bedtime.    Dispense:  90 tablet    Refill:  3   montelukast  (SINGULAIR ) 10 MG tablet    Sig: Take 1 tablet (10 mg total) by mouth at bedtime.    Dispense:  90 tablet    Refill:  3   traMADol  (ULTRAM ) 50 MG tablet    Sig: Take 1-2 tablets (50-100 mg total) by mouth every 6 (six) hours as needed.    Dispense:  30 tablet    Refill:  1   gabapentin  (NEURONTIN ) 300 MG capsule    Sig: Take 1 capsule (300 mg total) by mouth at bedtime.    Dispense:  90 capsule    Refill:  1   Medications Discontinued During This Encounter  Medication Reason   famotidine  (PEPCID ) 20 MG tablet Duplicate   betamethasone  valerate lotion (VALISONE ) 0.1 % Completed Course   OZEMPIC, 1 MG/DOSE, 4 MG/3ML SOPN Dose change   polyethylene glycol-electrolytes (NULYTELY) 420 g solution Completed Course   traMADol  (ULTRAM ) 50 MG tablet     triamcinolone  ointment (KENALOG ) 0.1 % Completed Course   triamcinolone  ointment (KENALOG ) 0.1 % Completed Course   polyethylene glycol-electrolytes (NULYTELY) 420 g solution Completed Course  OZEMPIC, 1 MG/DOSE, 4 MG/3ML SOPN Dose change   famotidine  (PEPCID ) 20 MG tablet Duplicate   betamethasone  valerate lotion (VALISONE ) 0.1 % Completed Course   FIBER ADULT GUMMIES PO Completed Course   docusate sodium  (COLACE) 100 MG capsule Completed Course   potassium chloride  SA (KLOR-CON  M) 20 MEQ tablet Completed Course   gabapentin  (NEURONTIN ) 100 MG capsule    ALPRAZolam  (XANAX ) 1 MG tablet Reorder   DULoxetine  (CYMBALTA ) 60 MG capsule Reorder   montelukast  (SINGULAIR ) 10 MG tablet Reorder   ezetimibe  (ZETIA ) 10 MG tablet Reorder   traMADol  (ULTRAM ) 50 MG tablet Reorder    Orders placed today for conditions managed today: Orders Placed This Encounter  Procedures   Ambulatory referral to ENT    Disposition: Return in about 2 months (around 03/25/2025) for Depression.  [employer] speech-to-text software was used for transcription in this dictation.  Possible transcriptional errors can occur using Animal nutritionist.   Signed,  Jacques DASEN. Tayron Hunnell, MD   Outpatient Encounter Medications as of 01/25/2025  Medication Sig   ACCU-CHEK GUIDE test strip    Accu-Chek Softclix Lancets lancets    Blood Glucose Monitoring Suppl (ACCU-CHEK GUIDE) w/Device KIT    Cholecalciferol (VITAMIN D3) 5000 UNITS CAPS Take 5,000 Units by mouth in the morning.   diphenhydrAMINE  (BENADRYL ) 25 MG tablet Take 25 mg by mouth at bedtime.   famotidine  (PEPCID ) 20 MG tablet Take 20 mg by mouth daily as needed for heartburn or indigestion.    gabapentin  (NEURONTIN ) 300 MG capsule Take 1 capsule (300 mg total) by mouth at bedtime.   hydrochlorothiazide  (HYDRODIURIL ) 25 MG tablet Take 50 mg by mouth in the morning.   loratadine  (CLARITIN ) 10 MG tablet Take 10 mg by mouth daily.   Multiple Vitamins-Minerals  (PRESERVISION AREDS 2 PO) Take 1 tablet by mouth in the morning and at bedtime.   OZEMPIC, 2 MG/DOSE, 8 MG/3ML SOPN Inject 2 mg into the skin once a week.   Polyethyl Glycol-Propyl Glycol (SYSTANE ULTRA PF OP) Place 1 drop into both eyes in the morning and at bedtime.    polyethylene glycol (MIRALAX / GLYCOLAX) 17 g packet Take 17 g by mouth in the morning.   rosuvastatin (CRESTOR) 5 MG tablet Take 5 mg by mouth every evening.   simethicone  (MYLICON) 80 MG chewable tablet Chew 80 mg by mouth daily as needed (gas).   [DISCONTINUED] ALPRAZolam  (XANAX ) 1 MG tablet Take 0.5-1.5 mg by mouth See admin instructions. Take 1.5 tablets (1.5 mg) by mouth scheduled every night & may take 0.5 tablet (0.5 mg) by mouth daily if needed for anxiety.   [DISCONTINUED] docusate sodium  (COLACE) 100 MG capsule Take 100 mg by mouth 2 (two) times daily as needed (constipation.).   [DISCONTINUED] DULoxetine  (CYMBALTA ) 60 MG capsule Take 60 mg by mouth at bedtime.   [DISCONTINUED] ezetimibe  (ZETIA ) 10 MG tablet Take 10 mg by mouth at bedtime.   [DISCONTINUED] FIBER ADULT GUMMIES PO Take 2 tablets by mouth 2 (two) times a week.   [DISCONTINUED] gabapentin  (NEURONTIN ) 100 MG capsule Take 100-200 mg by mouth See admin instructions. Take 1 capsule (100 mg) by mouth in the morning & take 2 capsules (200 mg) by mouth at bedtime.   [DISCONTINUED] montelukast  (SINGULAIR ) 10 MG tablet Take 10 mg by mouth at bedtime.   [DISCONTINUED] potassium chloride  SA (KLOR-CON  M) 20 MEQ tablet Take 1 tablet (20 mEq total) by mouth daily.   [DISCONTINUED] traMADol  (ULTRAM ) 50 MG tablet Take 50-100 mg by mouth  every 6 (six) hours as needed.   ALPRAZolam  (XANAX ) 1 MG tablet Take 1-1.5 tablets (1-1.5 mg total) by mouth 2 (two) times daily as needed for anxiety. Take 1.5 tablets (1.5 mg) by mouth scheduled every night & may take 0.5 tablet (0.5 mg) by mouth daily if needed for anxiety.   DULoxetine  (CYMBALTA ) 60 MG capsule Take 1 capsule (60 mg total)  by mouth 2 (two) times daily.   ezetimibe  (ZETIA ) 10 MG tablet Take 1 tablet (10 mg total) by mouth at bedtime.   montelukast  (SINGULAIR ) 10 MG tablet Take 1 tablet (10 mg total) by mouth at bedtime.   traMADol  (ULTRAM ) 50 MG tablet Take 1-2 tablets (50-100 mg total) by mouth every 6 (six) hours as needed.   [DISCONTINUED] betamethasone  valerate lotion (VALISONE ) 0.1 % Apply 1 Application topically 2 (two) times daily. (Patient not taking: Reported on 11/01/2024)   [DISCONTINUED] famotidine  (PEPCID ) 20 MG tablet Take 1 tablet (20 mg total) by mouth daily.   [DISCONTINUED] OZEMPIC, 1 MG/DOSE, 4 MG/3ML SOPN Inject 1 mg into the skin every Saturday. (Patient not taking: Reported on 11/01/2024)   [DISCONTINUED] polyethylene glycol-electrolytes (NULYTELY) 420 g solution See admin instructions. (Patient not taking: Reported on 11/01/2024)   [DISCONTINUED] traMADol  (ULTRAM ) 50 MG tablet Take 1-2 tablets (50-100 mg total) by mouth every 6 (six) hours as needed (pain).   [DISCONTINUED] triamcinolone  ointment (KENALOG ) 0.1 % Apply 1 Application topically 2 (two) times daily. (Patient not taking: Reported on 11/01/2024)   No facility-administered encounter medications on file as of 01/25/2025.   "

## 2025-01-25 ENCOUNTER — Ambulatory Visit: Admitting: Family Medicine

## 2025-01-25 ENCOUNTER — Encounter: Payer: Self-pay | Admitting: Family Medicine

## 2025-01-25 ENCOUNTER — Other Ambulatory Visit: Payer: Self-pay | Admitting: Family Medicine

## 2025-01-25 VITALS — BP 130/80 | HR 78 | Temp 98.1°F | Ht 60.5 in | Wt 170.1 lb

## 2025-01-25 DIAGNOSIS — M3500 Sicca syndrome, unspecified: Secondary | ICD-10-CM | POA: Diagnosis not present

## 2025-01-25 DIAGNOSIS — K581 Irritable bowel syndrome with constipation: Secondary | ICD-10-CM

## 2025-01-25 DIAGNOSIS — D472 Monoclonal gammopathy: Secondary | ICD-10-CM | POA: Diagnosis not present

## 2025-01-25 DIAGNOSIS — Z85820 Personal history of malignant melanoma of skin: Secondary | ICD-10-CM | POA: Diagnosis not present

## 2025-01-25 DIAGNOSIS — E1140 Type 2 diabetes mellitus with diabetic neuropathy, unspecified: Secondary | ICD-10-CM | POA: Diagnosis not present

## 2025-01-25 DIAGNOSIS — K760 Fatty (change of) liver, not elsewhere classified: Secondary | ICD-10-CM | POA: Diagnosis not present

## 2025-01-25 DIAGNOSIS — I10 Essential (primary) hypertension: Secondary | ICD-10-CM

## 2025-01-25 DIAGNOSIS — M8580 Other specified disorders of bone density and structure, unspecified site: Secondary | ICD-10-CM

## 2025-01-25 DIAGNOSIS — K219 Gastro-esophageal reflux disease without esophagitis: Secondary | ICD-10-CM | POA: Diagnosis not present

## 2025-01-25 DIAGNOSIS — H9193 Unspecified hearing loss, bilateral: Secondary | ICD-10-CM

## 2025-01-25 DIAGNOSIS — Z780 Asymptomatic menopausal state: Secondary | ICD-10-CM

## 2025-01-25 DIAGNOSIS — H3530 Unspecified macular degeneration: Secondary | ICD-10-CM

## 2025-01-25 DIAGNOSIS — E782 Mixed hyperlipidemia: Secondary | ICD-10-CM

## 2025-01-25 DIAGNOSIS — Z87442 Personal history of urinary calculi: Secondary | ICD-10-CM

## 2025-01-25 MED ORDER — EZETIMIBE 10 MG PO TABS: 10.0000 mg | ORAL_TABLET | Freq: Every day | ORAL | 3 refills | Status: AC

## 2025-01-25 MED ORDER — DULOXETINE HCL 60 MG PO CPEP: 60.0000 mg | ORAL_CAPSULE | Freq: Two times a day (BID) | ORAL | 1 refills | Status: AC

## 2025-01-25 MED ORDER — TRAMADOL HCL 50 MG PO TABS: 50.0000 mg | ORAL_TABLET | Freq: Four times a day (QID) | ORAL | 1 refills | Status: DC | PRN

## 2025-01-25 MED ORDER — MONTELUKAST SODIUM 10 MG PO TABS: 10.0000 mg | ORAL_TABLET | Freq: Every day | ORAL | 3 refills | Status: AC

## 2025-01-25 MED ORDER — ALPRAZOLAM 1 MG PO TABS: 1.0000 mg | ORAL_TABLET | Freq: Two times a day (BID) | ORAL | 2 refills | Status: AC | PRN

## 2025-01-25 MED ORDER — GABAPENTIN 300 MG PO CAPS: 300.0000 mg | ORAL_CAPSULE | Freq: Every day | ORAL | 1 refills | Status: AC

## 2025-01-27 ENCOUNTER — Encounter: Payer: Self-pay | Admitting: Family Medicine

## 2025-01-27 MED ORDER — TRAMADOL HCL 50 MG PO TABS: 50.0000 mg | ORAL_TABLET | Freq: Three times a day (TID) | ORAL | Status: AC | PRN

## 2025-03-06 ENCOUNTER — Institutional Professional Consult (permissible substitution) (INDEPENDENT_AMBULATORY_CARE_PROVIDER_SITE_OTHER)

## 2025-04-10 ENCOUNTER — Ambulatory Visit: Admitting: Family Medicine

## 2025-05-02 ENCOUNTER — Inpatient Hospital Stay: Attending: Physician Assistant

## 2025-05-16 ENCOUNTER — Inpatient Hospital Stay: Admitting: Hematology and Oncology
